# Patient Record
Sex: Male | Born: 1935 | Race: White | Hispanic: No | Marital: Married | Smoking: Former smoker
Health system: Southern US, Community
[De-identification: ages and names within clinical notes are randomized; demographics above are authoritative.]

## PROBLEM LIST (undated history)

## (undated) DIAGNOSIS — Z9289 Personal history of other medical treatment: Secondary | ICD-10-CM

## (undated) DIAGNOSIS — R51 Headache: Secondary | ICD-10-CM

## (undated) DIAGNOSIS — I714 Abdominal aortic aneurysm, without rupture, unspecified: Secondary | ICD-10-CM

## (undated) DIAGNOSIS — F329 Major depressive disorder, single episode, unspecified: Secondary | ICD-10-CM

## (undated) DIAGNOSIS — M1990 Unspecified osteoarthritis, unspecified site: Secondary | ICD-10-CM

## (undated) DIAGNOSIS — I2510 Atherosclerotic heart disease of native coronary artery without angina pectoris: Secondary | ICD-10-CM

## (undated) DIAGNOSIS — E785 Hyperlipidemia, unspecified: Secondary | ICD-10-CM

## (undated) DIAGNOSIS — I739 Peripheral vascular disease, unspecified: Secondary | ICD-10-CM

## (undated) DIAGNOSIS — J849 Interstitial pulmonary disease, unspecified: Secondary | ICD-10-CM

## (undated) DIAGNOSIS — F32A Depression, unspecified: Secondary | ICD-10-CM

## (undated) DIAGNOSIS — K754 Autoimmune hepatitis: Secondary | ICD-10-CM

## (undated) DIAGNOSIS — K649 Unspecified hemorrhoids: Secondary | ICD-10-CM

## (undated) DIAGNOSIS — I6529 Occlusion and stenosis of unspecified carotid artery: Secondary | ICD-10-CM

## (undated) DIAGNOSIS — I10 Essential (primary) hypertension: Secondary | ICD-10-CM

## (undated) DIAGNOSIS — G4486 Cervicogenic headache: Secondary | ICD-10-CM

## (undated) DIAGNOSIS — C801 Malignant (primary) neoplasm, unspecified: Secondary | ICD-10-CM

## (undated) DIAGNOSIS — R413 Other amnesia: Secondary | ICD-10-CM

## (undated) HISTORY — DX: Hyperlipidemia, unspecified: E78.5

## (undated) HISTORY — PX: POLYPECTOMY: SHX149

## (undated) HISTORY — PX: TONSILLECTOMY: SUR1361

## (undated) HISTORY — DX: Autoimmune hepatitis: K75.4

## (undated) HISTORY — DX: Peripheral vascular disease, unspecified: I73.9

## (undated) HISTORY — DX: Abdominal aortic aneurysm, without rupture: I71.4

## (undated) HISTORY — DX: Headache: R51

## (undated) HISTORY — DX: Depression, unspecified: F32.A

## (undated) HISTORY — DX: Unspecified hemorrhoids: K64.9

## (undated) HISTORY — PX: APPENDECTOMY: SHX54

## (undated) HISTORY — DX: Cervicogenic headache: G44.86

## (undated) HISTORY — DX: Essential (primary) hypertension: I10

## (undated) HISTORY — DX: Atherosclerotic heart disease of native coronary artery without angina pectoris: I25.10

## (undated) HISTORY — PX: SKIN CANCER EXCISION: SHX779

## (undated) HISTORY — PX: LUMBAR DISC SURGERY: SHX700

## (undated) HISTORY — DX: Interstitial pulmonary disease, unspecified: J84.9

## (undated) HISTORY — DX: Malignant (primary) neoplasm, unspecified: C80.1

## (undated) HISTORY — DX: Abdominal aortic aneurysm, without rupture, unspecified: I71.40

## (undated) HISTORY — DX: Personal history of other medical treatment: Z92.89

## (undated) HISTORY — DX: Other amnesia: R41.3

## (undated) HISTORY — PX: SPINE SURGERY: SHX786

## (undated) HISTORY — DX: Major depressive disorder, single episode, unspecified: F32.9

## (undated) HISTORY — DX: Unspecified osteoarthritis, unspecified site: M19.90

## (undated) HISTORY — DX: Occlusion and stenosis of unspecified carotid artery: I65.29

---

## 1999-10-18 HISTORY — PX: CORONARY ARTERY BYPASS GRAFT: SHX141

## 2005-08-29 ENCOUNTER — Ambulatory Visit: Payer: Self-pay | Admitting: Cardiology

## 2005-09-05 ENCOUNTER — Ambulatory Visit: Payer: Self-pay | Admitting: Cardiology

## 2005-09-21 ENCOUNTER — Ambulatory Visit: Payer: Self-pay | Admitting: Cardiology

## 2005-09-21 ENCOUNTER — Ambulatory Visit: Payer: Self-pay

## 2005-09-29 ENCOUNTER — Ambulatory Visit: Payer: Self-pay

## 2006-01-23 ENCOUNTER — Ambulatory Visit: Payer: Self-pay | Admitting: Internal Medicine

## 2006-01-31 ENCOUNTER — Ambulatory Visit: Payer: Self-pay | Admitting: Internal Medicine

## 2006-03-31 ENCOUNTER — Ambulatory Visit: Payer: Self-pay

## 2006-09-25 ENCOUNTER — Ambulatory Visit: Payer: Self-pay

## 2006-11-08 ENCOUNTER — Ambulatory Visit: Payer: Self-pay | Admitting: Cardiology

## 2006-11-08 ENCOUNTER — Ambulatory Visit: Payer: Self-pay

## 2006-11-16 ENCOUNTER — Ambulatory Visit: Payer: Self-pay

## 2007-03-14 ENCOUNTER — Ambulatory Visit: Payer: Self-pay

## 2007-08-06 ENCOUNTER — Encounter: Admission: RE | Admit: 2007-08-06 | Discharge: 2007-08-06 | Payer: Self-pay | Admitting: Internal Medicine

## 2007-08-07 ENCOUNTER — Encounter: Admission: RE | Admit: 2007-08-07 | Discharge: 2007-08-07 | Payer: Self-pay | Admitting: Internal Medicine

## 2007-08-13 ENCOUNTER — Encounter: Admission: RE | Admit: 2007-08-13 | Discharge: 2007-08-13 | Payer: Self-pay | Admitting: Internal Medicine

## 2007-08-24 ENCOUNTER — Ambulatory Visit: Payer: Self-pay

## 2007-08-27 ENCOUNTER — Encounter: Admission: RE | Admit: 2007-08-27 | Discharge: 2007-08-27 | Payer: Self-pay | Admitting: Internal Medicine

## 2007-10-25 ENCOUNTER — Ambulatory Visit: Payer: Self-pay | Admitting: Cardiology

## 2007-10-29 ENCOUNTER — Ambulatory Visit: Payer: Self-pay

## 2007-12-27 ENCOUNTER — Encounter: Admission: RE | Admit: 2007-12-27 | Discharge: 2007-12-27 | Payer: Self-pay | Admitting: Internal Medicine

## 2008-02-21 ENCOUNTER — Ambulatory Visit: Payer: Self-pay

## 2008-04-07 ENCOUNTER — Inpatient Hospital Stay (HOSPITAL_COMMUNITY): Admission: EM | Admit: 2008-04-07 | Discharge: 2008-04-08 | Payer: Self-pay | Admitting: Emergency Medicine

## 2008-04-07 ENCOUNTER — Ambulatory Visit: Payer: Self-pay | Admitting: Internal Medicine

## 2008-04-10 ENCOUNTER — Encounter: Admission: RE | Admit: 2008-04-10 | Discharge: 2008-04-10 | Payer: Self-pay | Admitting: Neurological Surgery

## 2008-04-17 ENCOUNTER — Encounter: Admission: RE | Admit: 2008-04-17 | Discharge: 2008-04-17 | Payer: Self-pay | Admitting: Neurological Surgery

## 2008-05-17 HISTORY — PX: OTHER SURGICAL HISTORY: SHX169

## 2008-05-21 ENCOUNTER — Ambulatory Visit (HOSPITAL_COMMUNITY): Admission: RE | Admit: 2008-05-21 | Discharge: 2008-05-22 | Payer: Self-pay | Admitting: Orthopedic Surgery

## 2008-06-24 ENCOUNTER — Ambulatory Visit: Payer: Self-pay | Admitting: Cardiology

## 2008-08-26 ENCOUNTER — Ambulatory Visit: Payer: Self-pay

## 2009-02-09 ENCOUNTER — Telehealth (INDEPENDENT_AMBULATORY_CARE_PROVIDER_SITE_OTHER): Payer: Self-pay | Admitting: *Deleted

## 2009-02-10 DIAGNOSIS — K7689 Other specified diseases of liver: Secondary | ICD-10-CM

## 2009-02-10 DIAGNOSIS — I714 Abdominal aortic aneurysm, without rupture, unspecified: Secondary | ICD-10-CM | POA: Insufficient documentation

## 2009-02-11 ENCOUNTER — Ambulatory Visit: Payer: Self-pay | Admitting: Internal Medicine

## 2009-02-11 DIAGNOSIS — R740 Nonspecific elevation of levels of transaminase and lactic acid dehydrogenase [LDH]: Secondary | ICD-10-CM

## 2009-02-11 DIAGNOSIS — IMO0002 Reserved for concepts with insufficient information to code with codable children: Secondary | ICD-10-CM | POA: Insufficient documentation

## 2009-02-11 DIAGNOSIS — I2510 Atherosclerotic heart disease of native coronary artery without angina pectoris: Secondary | ICD-10-CM

## 2009-02-11 DIAGNOSIS — K649 Unspecified hemorrhoids: Secondary | ICD-10-CM | POA: Insufficient documentation

## 2009-02-11 LAB — CONVERTED CEMR LAB
Alkaline Phosphatase: 148 units/L — ABNORMAL HIGH (ref 39–117)
Bilirubin, Direct: 0.2 mg/dL (ref 0.0–0.3)
INR: 1 (ref 0.8–1.0)
Iron: 133 ug/dL (ref 42–165)
Prothrombin Time: 11.3 s (ref 10.9–13.3)
Saturation Ratios: 29.1 % (ref 20.0–50.0)
Total Bilirubin: 1 mg/dL (ref 0.3–1.2)
Total Protein: 7.9 g/dL (ref 6.0–8.3)
Transferrin: 326.1 mg/dL (ref 212.0–360.0)

## 2009-02-19 ENCOUNTER — Ambulatory Visit: Payer: Self-pay | Admitting: Internal Medicine

## 2009-02-19 LAB — CONVERTED CEMR LAB: Anti Nuclear Antibody(ANA): NEGATIVE

## 2009-02-23 ENCOUNTER — Encounter: Payer: Self-pay | Admitting: Internal Medicine

## 2009-02-23 DIAGNOSIS — K754 Autoimmune hepatitis: Secondary | ICD-10-CM | POA: Insufficient documentation

## 2009-02-23 LAB — CONVERTED CEMR LAB
ALT: 532 units/L — ABNORMAL HIGH (ref 0–53)
AST: 544 units/L — ABNORMAL HIGH (ref 0–37)
Albumin: 3.7 g/dL (ref 3.5–5.2)
Alkaline Phosphatase: 126 units/L — ABNORMAL HIGH (ref 39–117)
Total Protein: 7.3 g/dL (ref 6.0–8.3)

## 2009-02-24 ENCOUNTER — Telehealth (INDEPENDENT_AMBULATORY_CARE_PROVIDER_SITE_OTHER): Payer: Self-pay | Admitting: *Deleted

## 2009-02-26 ENCOUNTER — Ambulatory Visit: Payer: Self-pay | Admitting: Internal Medicine

## 2009-03-02 ENCOUNTER — Encounter (INDEPENDENT_AMBULATORY_CARE_PROVIDER_SITE_OTHER): Payer: Self-pay | Admitting: Diagnostic Radiology

## 2009-03-02 ENCOUNTER — Ambulatory Visit (HOSPITAL_COMMUNITY): Admission: RE | Admit: 2009-03-02 | Discharge: 2009-03-02 | Payer: Self-pay | Admitting: Internal Medicine

## 2009-03-02 ENCOUNTER — Encounter: Payer: Self-pay | Admitting: Internal Medicine

## 2009-03-04 ENCOUNTER — Ambulatory Visit: Payer: Self-pay | Admitting: Internal Medicine

## 2009-03-04 LAB — CONVERTED CEMR LAB
ALT: 509 units/L — ABNORMAL HIGH (ref 0–53)
AST: 487 units/L — ABNORMAL HIGH (ref 0–37)
Alkaline Phosphatase: 144 units/L — ABNORMAL HIGH (ref 39–117)
Bilirubin, Direct: 0.3 mg/dL (ref 0.0–0.3)
Total Bilirubin: 0.7 mg/dL (ref 0.3–1.2)

## 2009-03-12 ENCOUNTER — Encounter: Payer: Self-pay | Admitting: Cardiology

## 2009-03-12 ENCOUNTER — Ambulatory Visit: Payer: Self-pay

## 2009-03-12 ENCOUNTER — Telehealth: Payer: Self-pay | Admitting: Internal Medicine

## 2009-03-19 ENCOUNTER — Telehealth: Payer: Self-pay | Admitting: Cardiology

## 2009-03-20 ENCOUNTER — Ambulatory Visit: Payer: Self-pay | Admitting: Internal Medicine

## 2009-03-20 ENCOUNTER — Telehealth: Payer: Self-pay | Admitting: Cardiology

## 2009-03-20 LAB — CONVERTED CEMR LAB
ALT: 60 units/L — ABNORMAL HIGH (ref 0–53)
Albumin: 3.3 g/dL — ABNORMAL LOW (ref 3.5–5.2)
Bilirubin, Direct: 0.2 mg/dL (ref 0.0–0.3)
INR: 1 (ref 0.8–1.0)
Prothrombin Time: 10.6 s — ABNORMAL LOW (ref 10.9–13.3)
Total Protein: 6.7 g/dL (ref 6.0–8.3)

## 2009-03-30 ENCOUNTER — Ambulatory Visit: Payer: Self-pay | Admitting: Internal Medicine

## 2009-03-30 LAB — CONVERTED CEMR LAB
AST: 44 units/L — ABNORMAL HIGH (ref 0–37)
Albumin: 3.1 g/dL — ABNORMAL LOW (ref 3.5–5.2)
Prothrombin Time: 10.4 s — ABNORMAL LOW (ref 10.9–13.3)

## 2009-03-31 ENCOUNTER — Ambulatory Visit: Payer: Self-pay | Admitting: Internal Medicine

## 2009-04-14 ENCOUNTER — Ambulatory Visit: Payer: Self-pay | Admitting: Internal Medicine

## 2009-04-14 LAB — CONVERTED CEMR LAB
Bilirubin, Direct: 0.1 mg/dL (ref 0.0–0.3)
Total Protein: 6.1 g/dL (ref 6.0–8.3)

## 2009-05-11 ENCOUNTER — Encounter (INDEPENDENT_AMBULATORY_CARE_PROVIDER_SITE_OTHER): Payer: Self-pay | Admitting: *Deleted

## 2009-05-13 ENCOUNTER — Ambulatory Visit: Payer: Self-pay | Admitting: Internal Medicine

## 2009-05-13 LAB — CONVERTED CEMR LAB
Alkaline Phosphatase: 70 units/L (ref 39–117)
Bilirubin, Direct: 0.1 mg/dL (ref 0.0–0.3)
Total Protein: 6.2 g/dL (ref 6.0–8.3)

## 2009-05-21 ENCOUNTER — Telehealth: Payer: Self-pay | Admitting: Cardiology

## 2009-06-12 ENCOUNTER — Ambulatory Visit: Payer: Self-pay

## 2009-06-12 ENCOUNTER — Encounter: Payer: Self-pay | Admitting: Cardiology

## 2009-06-26 ENCOUNTER — Ambulatory Visit: Payer: Self-pay | Admitting: Internal Medicine

## 2009-06-26 LAB — CONVERTED CEMR LAB
AST: 39 units/L — ABNORMAL HIGH (ref 0–37)
Albumin: 3.5 g/dL (ref 3.5–5.2)
Alkaline Phosphatase: 54 units/L (ref 39–117)
Total Protein: 6.6 g/dL (ref 6.0–8.3)

## 2009-07-15 DIAGNOSIS — M1990 Unspecified osteoarthritis, unspecified site: Secondary | ICD-10-CM | POA: Insufficient documentation

## 2009-07-15 DIAGNOSIS — I6529 Occlusion and stenosis of unspecified carotid artery: Secondary | ICD-10-CM

## 2009-07-15 DIAGNOSIS — E785 Hyperlipidemia, unspecified: Secondary | ICD-10-CM

## 2009-07-15 DIAGNOSIS — I959 Hypotension, unspecified: Secondary | ICD-10-CM

## 2009-07-15 DIAGNOSIS — I739 Peripheral vascular disease, unspecified: Secondary | ICD-10-CM | POA: Insufficient documentation

## 2009-07-15 DIAGNOSIS — R079 Chest pain, unspecified: Secondary | ICD-10-CM | POA: Insufficient documentation

## 2009-07-16 ENCOUNTER — Ambulatory Visit: Payer: Self-pay | Admitting: Cardiology

## 2009-07-20 ENCOUNTER — Ambulatory Visit: Payer: Self-pay | Admitting: Internal Medicine

## 2009-07-21 LAB — CONVERTED CEMR LAB: Albumin: 3.5 g/dL (ref 3.5–5.2)

## 2009-07-24 ENCOUNTER — Ambulatory Visit: Payer: Self-pay | Admitting: Internal Medicine

## 2009-09-16 ENCOUNTER — Telehealth: Payer: Self-pay | Admitting: Internal Medicine

## 2009-09-17 ENCOUNTER — Ambulatory Visit: Payer: Self-pay | Admitting: Internal Medicine

## 2009-09-17 LAB — CONVERTED CEMR LAB
Bilirubin, Direct: 0.1 mg/dL (ref 0.0–0.3)
Total Bilirubin: 0.8 mg/dL (ref 0.3–1.2)

## 2009-12-11 ENCOUNTER — Encounter (INDEPENDENT_AMBULATORY_CARE_PROVIDER_SITE_OTHER): Payer: Self-pay | Admitting: *Deleted

## 2009-12-14 ENCOUNTER — Ambulatory Visit: Payer: Self-pay

## 2009-12-14 ENCOUNTER — Encounter: Payer: Self-pay | Admitting: Cardiology

## 2009-12-15 ENCOUNTER — Ambulatory Visit: Payer: Self-pay | Admitting: Internal Medicine

## 2009-12-15 ENCOUNTER — Telehealth: Payer: Self-pay | Admitting: Internal Medicine

## 2009-12-15 LAB — CONVERTED CEMR LAB
ALT: 28 units/L (ref 0–53)
AST: 34 units/L (ref 0–37)
Alkaline Phosphatase: 80 units/L (ref 39–117)
Bilirubin, Direct: 0.1 mg/dL (ref 0.0–0.3)
Total Bilirubin: 0.5 mg/dL (ref 0.3–1.2)

## 2010-02-10 ENCOUNTER — Ambulatory Visit: Payer: Self-pay | Admitting: Internal Medicine

## 2010-03-08 ENCOUNTER — Ambulatory Visit: Payer: Self-pay | Admitting: Internal Medicine

## 2010-03-08 LAB — CONVERTED CEMR LAB
ALT: 33 units/L (ref 0–53)
Albumin: 3.7 g/dL (ref 3.5–5.2)
Bilirubin, Direct: 0.1 mg/dL (ref 0.0–0.3)
Total Protein: 6.3 g/dL (ref 6.0–8.3)

## 2010-03-09 ENCOUNTER — Ambulatory Visit: Payer: Self-pay | Admitting: Internal Medicine

## 2010-03-12 ENCOUNTER — Encounter: Payer: Self-pay | Admitting: Cardiology

## 2010-03-16 ENCOUNTER — Ambulatory Visit: Payer: Self-pay

## 2010-03-17 ENCOUNTER — Ambulatory Visit (HOSPITAL_COMMUNITY)
Admission: RE | Admit: 2010-03-17 | Discharge: 2010-03-17 | Payer: Self-pay | Source: Home / Self Care | Admitting: Internal Medicine

## 2010-03-17 ENCOUNTER — Encounter (INDEPENDENT_AMBULATORY_CARE_PROVIDER_SITE_OTHER): Payer: Self-pay | Admitting: *Deleted

## 2010-03-29 ENCOUNTER — Telehealth: Payer: Self-pay | Admitting: Internal Medicine

## 2010-04-12 ENCOUNTER — Telehealth: Payer: Self-pay | Admitting: Internal Medicine

## 2010-06-25 ENCOUNTER — Encounter: Payer: Self-pay | Admitting: Internal Medicine

## 2010-06-30 ENCOUNTER — Telehealth: Payer: Self-pay | Admitting: Internal Medicine

## 2010-07-13 ENCOUNTER — Encounter: Payer: Self-pay | Admitting: Cardiology

## 2010-07-14 ENCOUNTER — Ambulatory Visit: Payer: Self-pay

## 2010-07-14 ENCOUNTER — Encounter: Payer: Self-pay | Admitting: Cardiology

## 2010-07-21 ENCOUNTER — Encounter: Payer: Self-pay | Admitting: Cardiology

## 2010-07-26 ENCOUNTER — Ambulatory Visit: Payer: Self-pay | Admitting: Cardiology

## 2010-07-26 ENCOUNTER — Telehealth: Payer: Self-pay | Admitting: Cardiology

## 2010-08-02 ENCOUNTER — Telehealth: Payer: Self-pay | Admitting: Cardiology

## 2010-08-04 ENCOUNTER — Ambulatory Visit: Payer: Self-pay | Admitting: Vascular Surgery

## 2010-08-04 ENCOUNTER — Encounter: Admission: RE | Admit: 2010-08-04 | Discharge: 2010-08-04 | Payer: Self-pay | Admitting: Vascular Surgery

## 2010-08-04 ENCOUNTER — Encounter: Payer: Self-pay | Admitting: Cardiology

## 2010-08-09 ENCOUNTER — Telehealth: Payer: Self-pay | Admitting: Cardiology

## 2010-08-09 ENCOUNTER — Ambulatory Visit (HOSPITAL_COMMUNITY): Admission: RE | Admit: 2010-08-09 | Discharge: 2010-08-09 | Payer: Self-pay | Admitting: Vascular Surgery

## 2010-08-12 ENCOUNTER — Telehealth (INDEPENDENT_AMBULATORY_CARE_PROVIDER_SITE_OTHER): Payer: Self-pay | Admitting: *Deleted

## 2010-08-16 ENCOUNTER — Encounter: Payer: Self-pay | Admitting: Internal Medicine

## 2010-08-16 ENCOUNTER — Ambulatory Visit: Payer: Self-pay

## 2010-08-16 ENCOUNTER — Ambulatory Visit: Payer: Self-pay | Admitting: Internal Medicine

## 2010-08-16 ENCOUNTER — Encounter (HOSPITAL_COMMUNITY)
Admission: RE | Admit: 2010-08-16 | Discharge: 2010-10-16 | Payer: Self-pay | Source: Home / Self Care | Attending: Cardiology | Admitting: Cardiology

## 2010-08-18 ENCOUNTER — Encounter: Payer: Self-pay | Admitting: Cardiology

## 2010-08-18 ENCOUNTER — Ambulatory Visit: Payer: Self-pay | Admitting: Vascular Surgery

## 2010-08-18 ENCOUNTER — Telehealth: Payer: Self-pay | Admitting: Cardiology

## 2010-08-30 ENCOUNTER — Ambulatory Visit: Payer: Self-pay | Admitting: Internal Medicine

## 2010-08-30 LAB — CONVERTED CEMR LAB
Bilirubin, Direct: 0.1 mg/dL (ref 0.0–0.3)
Total Bilirubin: 0.6 mg/dL (ref 0.3–1.2)
Total Protein: 6.7 g/dL (ref 6.0–8.3)

## 2010-08-31 ENCOUNTER — Inpatient Hospital Stay (HOSPITAL_COMMUNITY): Admission: RE | Admit: 2010-08-31 | Discharge: 2010-09-01 | Payer: Self-pay | Admitting: Vascular Surgery

## 2010-08-31 ENCOUNTER — Ambulatory Visit: Payer: Self-pay | Admitting: Vascular Surgery

## 2010-08-31 HISTORY — PX: ABDOMINAL AORTIC ANEURYSM REPAIR: SUR1152

## 2010-09-15 ENCOUNTER — Encounter: Payer: Self-pay | Admitting: Cardiology

## 2010-09-15 ENCOUNTER — Encounter: Admission: RE | Admit: 2010-09-15 | Discharge: 2010-09-15 | Payer: Self-pay | Admitting: Vascular Surgery

## 2010-09-15 ENCOUNTER — Ambulatory Visit: Payer: Self-pay | Admitting: Vascular Surgery

## 2010-11-16 NOTE — Assessment & Plan Note (Signed)
Summary: F/U APPT.Marland Kitchenautoimmune hepatitis   History of Present Illness Visit Type: Follow-up Visit Primary GI MD: Yancey Flemings MD Primary Provider: Creola Corn, MD  Requesting Provider: n/a Chief Complaint: F/u for Autoimmune Hepatitis. Pt states that he is doing good and denies all other GI complaints  History of Present Illness:   75 year old white male with hypertension, coronary artery disease, and dyslipidemia. 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 He has been treated with prednisone with an excellent biochemical response. He was last evaluated in October 2010 at which time he was on 10 mg of prednisone daily. He has continued on that dosage and presents for routine office followup. Laboratories obtained December 15, 2009 revealed normal liver tests. Outside blood work from February 02, 2010 (reviewed) also shows normal liver tests in addition to albumin and protein levels. CBC also normal. The patient has no complaints. Inquire as about further tapering of prednisone. He tells me that recent bone density evaluation was normal.   GI Review of Systems      Denies abdominal pain, acid reflux, belching, bloating, chest pain, dysphagia with liquids, dysphagia with solids, heartburn, loss of appetite, nausea, vomiting, vomiting blood, weight loss, and  weight gain.      Reports liver problems.     Denies anal fissure, black tarry stools, change in bowel habit, constipation, diarrhea, diverticulosis, fecal incontinence, heme positive stool, hemorrhoids, irritable bowel syndrome, jaundice, light color stool, rectal bleeding, and  rectal pain.    Current Medications (verified): 1)  Atenolol 25 Mg Tabs (Atenolol) .Marland Kitchen.. 1 Tablet By Mouth Once Daily 2)  Prednisone 10 Mg Tabs (Prednisone) .... Take One Tablet Daily With Food 3)  Lipitor 10 Mg Tabs (Atorvastatin Calcium) .... 1/2 By Mouth Once Daily 4)  Aspirin 81 Mg Tbec (Aspirin) .Marland Kitchen.. 1  By Mouth Once Daily 5)  Sertraline Hcl 50 Mg Tabs (Sertraline Hcl) .... Once Daily 6)  Nitrostat 0.4 Mg Subl (Nitroglycerin) .Marland Kitchen.. 1 By Mouth As Needed  Allergies (verified): No Known Drug Allergies  Past History:  Past Medical History: HYPERTENSION NEC (ICD-997.91) CAD (ICD-414.00) CHEST PAIN (ICD-786.50) PVD (ICD-443.9) HYPOTENSION (ICD-458.9) CAROTID STENOSIS (ICD-433.10) (less than 60% bilateral) AUTOIMMUNE HEPATITIS (ICD-571.42) TRANSAMINASES, SERUM, ELEVATED (ICD-790.4) HEMORRHOIDS (ICD-455.6) FATTY LIVER DISEASE (ICD-571.8) AAA (ICD-441.4)  (4.4 x 4.4) DEGENERATIVE JOINT DISEASE (ICD-715.90) DYSLIPIDEMIA (ICD-272.4) AUTOIMMUNE HEPATITIS (ICD-571.42)  Past Surgical History: Reviewed history from 07/16/2009 and no changes required. CABG x 4 (He had LIMA to the LAD, SVG to diagonal, SVG to obtuse margin,  and SVG to PDA 2001) Appendectomy Tonsillectomy Skin cancer removal L2-3  hemilaminectomy and medial facetectomy.  Family History: No FH of Colon Cancer: Family History of Colon Polyps:Father  Social History: Reviewed history from 03/04/2009 and no changes required. Patient is a former smoker. quit 1970s Illicit Drug Use - no Occupation: Retired Daily Caffeine Use: 6 cups of coffee Alcohol Use - no  Review of Systems  The patient denies allergy/sinus, anemia, anxiety-new, arthritis/joint pain, back pain, blood in urine, breast changes/lumps, change in vision, confusion, cough, coughing up blood, depression-new, fainting, fatigue, fever, headaches-new, hearing problems, heart murmur, heart rhythm changes, itching, muscle pains/cramps, night sweats, nosebleeds, shortness of breath, skin rash, sleeping problems, sore throat, swelling of feet/legs, swollen lymph glands, thirst - excessive, urination - excessive, urination changes/pain, urine leakage, vision changes, and voice change.    Vital Signs:  Patient profile:   75 year old male Height:  72  inches Weight:      191 pounds BMI:     26.00 BSA:     2.09 Pulse rate:   68 / minute Pulse rhythm:   regular BP sitting:   122 / 64  (left arm) Cuff size:   regular  Vitals Entered By: Ok Anis CMA (February 10, 2010 8:29 AM)  Physical Exam  General:  Well developed, well nourished, no acute distress. Mildly cushingoid. Head:  Normocephalic and atraumatic. Eyes:  PERRLA, no icterus. Nose:  No deformity, discharge,  or lesions. Mouth:  No deformity or lesions, dentition normal. Neck:  Supple; no masses or thyromegaly. Lungs:  Clear throughout to auscultation. Heart:  Regular rate and rhythm; no murmurs, rubs,  or bruits. Abdomen:  Soft, nontender and nondistended. No masses, hepatosplenomegaly or hernias noted. Normal bowel sounds. Msk:  Symmetrical with no gross deformities. Normal posture. Pulses:  Normal pulses noted. Extremities:  No clubbing, cyanosis, edema or deformities noted. Neurologic:  Alert and  oriented x4;  grossly normal neurologically. Skin:  Intact without significant lesions or rashes. Psych:  Alert and cooperative. Normal mood and affect.   Impression & Recommendations:  Problem # 1:  AUTOIMMUNE HEPATITIS (ICD-571.42) Autoimmune hepatitis with PBC overlap syndrome. Clinically asymptomatic. Liver function tests entirely normal for at least 5 months.  Plan: #1. Decrease prednisone to 5 mg daily #2. Repeat liver function tests in one month #3. His liver tests remain normal then proceed with liver biopsy. This will be approximately 1 year since the initiation of therapy. Results of the biopsy will determine the feasibility of discontinuing prednisone therapy with close biochemical followup thereafter.  Patient Instructions: 1)  Decrease Prednisone- instructions given by Dr. Marina Goodell. 2)  LFTs. to be drawn end of May. 3)  The medication list was reviewed and reconciled.  All changed / newly prescribed medications were explained.  A complete medication list was  provided to the patient / caregiver. 4)  Copy: Dr. Creola Corn

## 2010-11-16 NOTE — Progress Notes (Signed)
Summary: Biopsy results  Phone Note Call from Patient Call back at Home Phone [phone]   Caller: Patient Call For: Dr. Marina Goodell Reason for Call: Lab or Test Results Summary of Call: Calling about liver biopsy results Initial call taken by: Karna Christmas,  March 29, 2010 11:56 AM  Follow-up for Phone Call        Let patient know that it looks much better. Haven't called him back yet as final path pending (sent to liver expert for additional opinion). No change in plans for now. I'll call him back to dicuss further, probably next week.  PLEASE MAKE SURE THAT THE REPORT GETS INTO EMR AS A PATH REPORT ON ITS OWN (not attached to a phone note)   Follow-up by: Hilarie Fredrickson MD,  March 29, 2010 2:12 PM    SP-Surgical Pathology - STATUS: Final  .                                         Perform Date: 1 Jun11 00:01  Ordered By: Hoy Finlay MD , PROVIDER         Ordered Date:  Facility: [facility]                              Department: [facility]  Service Report Text  [facility]   [address]   [place], [place] [address]   Telephone [phone] or [phone] Fax [phone]    REPORT OF SURGICAL PATHOLOGY    Case #: [id number]   Patient Name: Cesar [name], Cesar [name]   Office Chart Number: [id number]    MRN: [id number]   Pathologist: Dierdre Searles M.D., Gwendolyn Grant   DOB/Age April 16, 1936 (Age: 47) Gender: M   Date Taken: 03/17/2010   Date Received: 03/17/2010    FINAL DIAGNOSIS   ***Microscopic Examination and Diagnosis***    1. LIVER BX, : PATCHY MILD MIXED PORTAL INFLAMMATION. PLEASE SEE COMMENT.   Minimal portal fibrosis.   No iron deposition.    DATE REPORTED: 03/19/2010   *** Electronically Signed Out by Dierdre Searles M.D., Gwendolyn Grant, Pathologist, Electronic Signature ***    CLINICAL HISTORY    SPECIMEN(S) OBTAINED   1. Liver bx,    SPECIMEN COMMENTS:   1. Autoimmune hepatitis; previous biopsy 02/2009. (Lw)    Gross Description   1. Received in formalin are three  cores of tan brown soft tissue which range   from 1.3 x 0.1 cm to 1.6 x 0.1 cm and are submitted in toto in one block.   (Sw:Gt, 03/17/10)    MICROSCOPIC DESCRIPTION   1. Multiple needle core biopsies are available for evaluation. There is patchy   mild mixed portal inflammation predominantly composed of lymphocytes and plasma   cells. Compared to the prior biopsy material [id number], the interface   inflammation has markedly reduced with very minimal interface activity and   lobular activity . One portal tract demonstrates minimal lymphocytic   infiltrates in the duct. There are single and clusters of pigment laden   macrophages(ceroid macrophages) scattered in the lobules. No steatosis or viral   changes are noted. Reticulin stain and trichrome stain show focal minimal portal   fibrosis. Iron stain shows no evidence of iron deposition. Controls are   appropriate. The overall findings demonstrate significant response to treatment   and marked improvement in  morphology. Given the clinical history and histology   findings, i cannot completely exclude the possibility of statin triggered   autoimmune liver disease. Dr. Frederica Kuster has also reviewed this case and essentially   agrees with the above diagnosis. Case was discussed with dr. Marina Goodell on 03/19/2010.    CASE COMMENTS   STAINS USED IN DIAGNOSIS:   Iron stain   Masson's trichrome stain   Reticulin stain   Additional Information  HL7 RESULT STATUS : F  External IF Update Timestamp : 2010-03-19:19:17:00.000000 SP-Surgical Pathology - STATUS: Final  .                                         Perform Date: 1 Jun11 00:01  Ordered By: DEFAULT MD , PROVIDER         Ordered Date:  Facility: [facility]                              Department: CPATH  Service Report Text  [facility]   [address]   [place], [place] [address]   Telephone [phone] or [phone] Fax [phone]    REPORT OF SURGICAL PATHOLOGY     Case #: [id number]   Patient Name: Cesar [name], Cesar [name]   Office Chart Number: [id number]    MRN: [id number]   Pathologist: Dierdre Searles M.D., Gwendolyn Grant   DOB/Age [birth year] (Age: 70) Gender: M   Date Taken: 03/17/2010   Date Received: 03/17/2010    FINAL DIAGNOSIS   ***Microscopic Examination and Diagnosis***    1. LIVER BX, : PATCHY MILD MIXED PORTAL INFLAMMATION. PLEASE SEE COMMENT.   Minimal portal fibrosis.   No iron deposition.    DATE REPORTED: 03/19/2010   *** Electronically Signed Out by Dierdre Searles M.D., Gwendolyn Grant, Pathologist, Electronic Signature ***    CLINICAL HISTORY    SPECIMEN(S) OBTAINED   1. Liver bx,    SPECIMEN COMMENTS:   1. Autoimmune hepatitis; previous biopsy 02/2009. (Lw)    Gross Description   1. Received in formalin are three cores of tan brown soft tissue which range   from 1.3 x 0.1 cm to 1.6 x 0.1 cm and are submitted in toto in one block.   (Sw:Gt, 03/17/10)    MICROSCOPIC DESCRIPTION   1. Multiple needle core biopsies are available for evaluation. There is patchy   mild mixed portal inflammation predominantly composed of lymphocytes and plasma   cells. Compared to the prior biopsy material [id number], the interface   inflammation has markedly reduced with very minimal interface activity and   lobular activity . One portal tract demonstrates minimal lymphocytic   infiltrates in the duct. There are single and clusters of pigment laden   macrophages(ceroid macrophages) scattered in the lobules. No steatosis or viral   changes are noted. Reticulin stain and trichrome stain show focal minimal portal   fibrosis. Iron stain shows no evidence of iron deposition. Controls are   appropriate. The overall findings demonstrate significant response to treatment   and marked improvement in morphology. Given the clinical history and histology   findings, i cannot completely exclude the possibility of statin triggered   autoimmune liver disease. Dr. Frederica Kuster has also reviewed this case  and essentially   agrees with the above diagnosis. Case was discussed with dr. Marina Goodell on 03/19/2010.    CASE  COMMENTS   STAINS USED IN DIAGNOSIS:   Iron stain   Masson's trichrome stain   Reticulin stain   Additional Information  HL7 RESULT STATUS : F  External IF Update Timestamp : 2010-03-19:19:17:00.000000  Appended Document: Biopsy results pt. ntfd. of Dr.Perry's response to biopsy.

## 2010-11-16 NOTE — Progress Notes (Signed)
Summary: Lab results (LFT'S)  Phone Note Call from Patient Call back at Home Phone [phone]   Caller: Spouse Glenda Call For: Dr. Marina Goodell Reason for Call: Talk to Nurse Summary of Call: Calling about Lab results from Dr. Ferd Hibbs office...results was faxed on 06-28-10 Initial call taken by: Karna Christmas,  June 30, 2010 2:31 PM  Follow-up for Phone Call        Glenda ntfd. that Dr.Perry did review pt's. labs done at Dr.Russo's office on 06/28/10 and he indicated they are normal.Has been on Prednisone 5mg . daily since second liver biopsy asking if  it should be disc? Follow-up by: Teryl Lucy RN,  June 30, 2010 3:11 PM  Additional Follow-up for Phone Call Additional follow up Details #1::        go ahead and decrease prednisone to 2.5mg  for 2 weeks then stop. Have LFT's repeated in 2 months Additional Follow-up by: Hilarie Fredrickson MD,  June 30, 2010 3:26 PM    Additional Follow-up for Phone Call Additional follow up Details #2::    Message left to call back. Teryl Lucy RN  July 01, 2010 9:22 AM Orders from DrPerry reviewed with Rivka Barbara and labs scheduled for 2 months. Follow-up by: Teryl Lucy RN,  July 01, 2010 2:05 PM

## 2010-11-16 NOTE — Progress Notes (Signed)
----   Converted from flag ---- ---- 07/26/2010 11:20 AM, Hilarie Fredrickson MD wrote: Leta Jungling, there is a concern that he may have had "statin triggered autoimmune hepatitis". Thus, there is some concern for recurrent liver problems with reintroduction of the drug.  If there are non-statin alternatives, this might be best. If not, we would need to monitor his LFT's very closely.  John  ---- 07/26/2010 9:55 AM, Rollene Rotunda, MD, [facility] wrote: Jonny Ruiz,  Can I start this patient back on Lipitor? ------------------------------

## 2010-11-16 NOTE — Progress Notes (Signed)
Summary: pt needs stress test set up right away/ order sent  Phone Note Call from Patient Call back at Home Phone [phone]   Caller: Patient Reason for Call: Talk to Nurse, Talk to Doctor Summary of Call: pt is home from procedure today and Dr. Edilia Bo wants his stress test set up right away  Initial call taken by: Omer Jack,  August 09, 2010 10:46 AM  Follow-up for Phone Call        I have to have a order for the testing and type of test from Dr Antoine Poche but will have it scheduled as soon as I can obtain the order.  Sander Nephew, RN  Pt needs to have a stress myoview for surgical clearance per Dr Rollene Rotunda Follow-up by: Charolotte Capuchin, RN,  August 09, 2010 11:02 AM  New Problems: PRE-OPERATIVE CARDIOVASCULAR EXAMINATION (ICD-V72.81)   New Problems: PRE-OPERATIVE CARDIOVASCULAR EXAMINATION (ICD-V72.81)

## 2010-11-16 NOTE — Miscellaneous (Signed)
Summary: Orders Update  Clinical Lists Changes  Orders: Added new Test order of Carotid Duplex (Carotid Duplex) - Signed 

## 2010-11-16 NOTE — Miscellaneous (Signed)
  Clinical Lists Changes  Observations: Added new observation of ABDOM US: Increase in dimensions of infrarenal fusiform AAA, now 5.1 cm X 5.3 cm Increase in the lenght of AAA, now 12.1cm in length Normal caliber common and external iliac arteries, bilaterally Mild bilateral common iliac artery stenosis  consider surgical consult Otherwise, f/u 6 months (07/14/2010 8:57) Added new observation of US CAROTID: Mild carotid artery disease bilaterally, stable over serial exams Elevated subclavian artery velocities, possibly source of the bruit 40-59% RICA stenosis 0-39% LICA stenosis  f/u 2 years (03/12/2010 8:58)      [place] of Abdomen  Procedure date:  07/14/2010  Findings:      Increase in dimensions of infrarenal fusiform AAA, now 5.1 cm X 5.3 cm Increase in the lenght of AAA, now 12.1cm in length Normal caliber common and external iliac arteries, bilaterally Mild bilateral common iliac artery stenosis  consider surgical consult Otherwise, f/u 6 months  Carotid Doppler  Procedure date:  03/12/2010  Findings:      Mild carotid artery disease bilaterally, stable over serial exams Elevated subclavian artery velocities, possibly source of the bruit 40-59% RICA stenosis 0-39% LICA stenosis  f/u 2 years

## 2010-11-16 NOTE — Progress Notes (Signed)
Summary: Nuclear Pre-Procedure  Phone Note Outgoing Call   Call placed by: Gerre Pebbles RN Summary of Call: Left message with information on My[employer] Information Sheet (see scanned document for details).      Nuclear Med Background Indications for Stress Test: Evaluation for Ischemia, Surgical Clearance, Graft Patency  Indications Comments: Pre-OP AAA (5.3) C.Edilia Bo  History: CABG, Heart Catheterization, Myocardial Perfusion Study  History Comments: 02/01 CABG x4 01/09 MPS NL EF 65%     Nuclear Pre-Procedure Cardiac Risk Factors: Carotid Disease, Family History - CAD, History of Smoking, Hypertension, Lipids, PVD Height (in): 72  Nuclear Med Study Referring MD:  J.Hochrein

## 2010-11-16 NOTE — Assessment & Plan Note (Signed)
Summary: 1 yr rov 414.01  pfh,rn   Visit Type:  Follow-up Referring Provider:  n/a Primary Provider:  Creola Corn, MD   CC:  CAD and PVD.  History of Present Illness: The patient presents for followup. Since I last saw him he has had no new cardiovascular complaints and he has been treated for appetite is described as autoimmune currently reports that this is inactive in his medications have been reduced discontinued. During the course of this he has been taken off his statin. He has had followup of his abdominal aortic aneurysm and carotid stenosis. Carotids earlier this year are stable demonstrating 40-59% left stenosis and less than 39% right stenosis. However, the abdominal aneurysm has grown to 5.1 x 5.3 and is now 12.1 cm in length. He denies any cardiovascular symptoms and remains active. He denies any chest pressure, neck or arm discomfort. He has had no palpitations, presyncope or syncope. He denies any PND or orthopnea. He does have some leg cramping and occasional numbness in his right foot.  Current Medications (verified): 1)  Atenolol 25 Mg Tabs (Atenolol) .Marland Kitchen.. 1 Tablet By Mouth Once Daily 2)  Prednisone 10 Mg Tabs (Prednisone) .... Take 1/2  Tablet Daily With Food 3)  Aspirin 81 Mg Tbec (Aspirin) .Marland Kitchen.. 1 By Mouth Once Daily 4)  Sertraline Hcl 50 Mg Tabs (Sertraline Hcl) .... Once Daily 5)  Nitrostat 0.4 Mg Subl (Nitroglycerin) .Marland Kitchen.. 1 By Mouth As Needed 6)  Fenofibrate 160 Mg Tabs (Fenofibrate) .Marland Kitchen.. 1 By Mouth Daily  Allergies (verified): No Known Drug Allergies  Past History:  Past Medical History: HYPERTENSION NEC (ICD-997.91) CAD (ICD-414.00) CHEST PAIN (ICD-786.50) PVD (ICD-443.9) HYPOTENSION (ICD-458.9) CAROTID STENOSIS (ICD-433.10) (less than 60% bilateral) AUTOIMMUNE HEPATITIS (ICD-571.42) TRANSAMINASES, SERUM, ELEVATED (ICD-790.4) HEMORRHOIDS (ICD-455.6) FATTY LIVER DISEASE (ICD-571.8) AAA (ICD-441.4)  (5.1 x 5.3  06/2010) DEGENERATIVE JOINT DISEASE  (ICD-715.90) DYSLIPIDEMIA (ICD-272.4) AUTOIMMUNE HEPATITIS (ICD-571.42)  Past Surgical History: Reviewed history from 07/16/2009 and no changes required. CABG x 4 (He had LIMA to the LAD, SVG to diagonal, SVG to obtuse margin,  and SVG to PDA 2001) Appendectomy Tonsillectomy Skin cancer removal L2-3  hemilaminectomy and medial facetectomy.  Review of Systems       As stated in the HPI and negative for all other systems.   Vital Signs:  Patient profile:   75 year old male Height:      72 inches Weight:      178 pounds BMI:     24.23 Pulse rate:   61 / minute Resp:     16 per minute BP sitting:   108 / 56  (right arm)  Vitals Entered By: Marrion Coy, CNA (July 26, 2010 9:11 AM)  Physical Exam  General:  Well developed, well nourished, no acute distress. Head:  Normocephalic and atraumatic. Eyes:  PERRLA, no icterus. Neck:  Supple; no masses or thyromegaly. Chest Wall:  Symmetrical,  no deformities, well healed sternotomy scar. Lungs:  Clear bilaterally to auscultation and percussion. Abdomen:  Bowel sounds positive; abdomen soft and non-tender without masses, organomegaly, or hernias noted. No hepatosplenomegaly. Msk:  Back normal, normal gait. Muscle strength and tone normal. Extremities:  No clubbing or cyanosis. Neurologic:  Alert and oriented x 3. Skin:  Intact without lesions or rashes. Cervical Nodes:  no significant adenopathy Axillary Nodes:  no significant adenopathy Inguinal Nodes:  no significant adenopathy Psych:  Normal affect.   Detailed Cardiovascular Exam  Neck    Carotids: soft right carotid bruit    Neck  Veins: Normal, no JVD.    Heart    Inspection: no deformities or lifts noted.      Palpation: normal PMI with no thrills palpable.      Auscultation: regular rate and rhythm, S1, S2 without murmurs, rubs, gallops, or clicks.    Vascular    Abdominal Aorta: Pulsatile midline mass    Femoral Pulses: bilateral femoral bruits    Pedal  Pulses: diminished right dorsalis pedis pulse, diminished right posterior tibial pulse, diminished left dorsalis pedis pulse, and diminished left posterior tibial pulse.      Radial Pulses: normal radial pulses bilaterally.      Peripheral Circulation: no clubbing, cyanosis, or edema noted with normal capillary refill.     EKG  Procedure date:  07/26/2010  Findings:      Sinus rhythm, rate 61, axis within normal limits, intervals limits no acute ST-T wave changes  Impression & Recommendations:  Problem # 1:  CAD (ICD-414.00) The patient has no ongoing chest pain. I will screen him with a stress perfusion study prior to repair of his abdominal aortic aneurysm given his now 75 year old bypass grafts and lack of symptoms at the time of surgery. He will need continued risk reduction as described below. Orders: EKG w/ Interpretation (93000)  Problem # 2:  PVD (ICD-443.9) He will continue to have routine followup of his carotids. I will send him to vascular surgery to consider repair of his abdominal aneurysm. He will need preoperative screening as above and he will call me when he understands the timing of surgical repair.  Problem # 3:  DYSLIPIDEMIA (ICD-272.4) I would very much like to have on a statin.I will send a note to Dr. Marina Goodell the question whether we can restart at least pravastatin though I would prefer to restart him on Lipitor.  I think the goal should be an LDL less than 70 and HDL greater then 50.  Problem # 4:  HYPERTENSION NEC (ICD-997.91) Cesar [name] will continue on the meds as listed.  Other Orders: TCTS Referral (TCTS Ref)  Patient Instructions: 1)  Your physician recommends that you schedule a follow-up appointment in: 1 year but will need a stress test before any surgery for clearance 2)  Your physician recommends that you continue on your current medications as directed. Please refer to the Current Medication list given to you today. 3)  You have been referred to Dr  Waverly Ferrari for possbile AAA repair Prescriptions: NITROSTAT 0.4 MG SUBL (NITROGLYCERIN) 1 by mouth as needed  #25 x prn   Entered by:   Charolotte Capuchin, RN   Authorized by:   Rollene Rotunda, MD, [facility]   Signed by:   Charolotte Capuchin, RN on 07/26/2010   Method used:   Faxed to ...       [facility] (mail-order)       [address] [place], [place]  [address]       Ph: [phone]       Fax: [phone]   RxID:   [id number] ATENOLOL 25 MG TABS (ATENOLOL) 1 tablet by mouth once daily  #90 x 3   Entered by:   Charolotte Capuchin, RN   Authorized by:   Rollene Rotunda, MD, [facility]   Signed by:   Charolotte Capuchin, RN on 07/26/2010   Method used:   Faxed to ...       [facility] (mail-order)       [address]  [place], [place]  [address]       Ph: [phone]       Fax: [phone]   RxID:   [id number]   Appended Document: 1 yr rov 414.01  pfh,rn Unable to start statin secondary to hepatitis thought to be related to lipitor.

## 2010-11-16 NOTE — Assessment & Plan Note (Signed)
Summary: Cardiology Nuclear Testing  Nuclear Med Background Indications for Stress Test: Evaluation for Ischemia, Surgical Clearance, Graft Patency  Indications Comments: Pre-OP AAA (5.3) C.Edilia Bo  History: CABG, Heart Catheterization, Myocardial Perfusion Study  History Comments: 02/01 CABG x4 01/09 MPS NL EF 65%     Nuclear Pre-Procedure Cardiac Risk Factors: Carotid Disease, Family History - CAD, History of Smoking, Hypertension, Lipids, PVD Caffeine/Decaff Intake: None NPO After: 10:00 PM Lungs: clear IV 0.9% NS with Angio Cath: 20g     IV Site: R Antecubital IV Started by: Irean Hong, RN Chest Size (in) 42     Height (in): 72 Weight (lb): 172 BMI: 23.41 Tech Comments: Held atenolol 24 hrs.  Nuclear Med Study 1 or 2 day study:  1 day     Stress Test Type:  Eugenie Birks Reading MD:  Dietrich Pates, MD     Referring MD:  J.Hochrein Resting Radionuclide:  Technetium 3m Tetrofosmin     Resting Radionuclide Dose:  11 mCi  Stress Radionuclide:  Technetium 41m Tetrofosmin     Stress Radionuclide Dose:  33 mCi   Stress Protocol  Max Systolic BP: 120 mm Hg Lexiscan: 0.4 mg   Stress Test Technologist:  Cathlyn Parsons, RN     Nuclear Technologist:  Domenic Polite, CNMT  Rest Procedure  Myocardial perfusion imaging was performed at rest 45 minutes following the intravenous administration of Technetium 12m Tetrofosmin.  Stress Procedure  The patient received IV Lexiscan 0.4 mg over 15-seconds.  Technetium 56m Tetrofosmin injected at 30-seconds.  There were no significant changes with infusion. Patient had no chest pain with infusion only dizziness.  Patient was hypotensive post infusion.  Patient placed in supine position and hypotensive episode resolved.   Quantitative spect images were obtained after a 45 minute delay.  QPS Raw Data Images:  Images were motion corrected.  Soft tissue (dilpahragm) underlies heart. Stress Images:  MInimal thinning in the inferior wall (base,  mid, distal) and apex.  Otherwise normal perfusion. Rest Images:  NO significant change from the stress images. Subtraction (SDS):  No evidence of ischemia. Transient Ischemic Dilatation:  1.19  (Normal <1.22)  Lung/Heart Ratio:  0.28  (Normal <0.45)  Quantitative Gated Spect Images QGS EDV:  102 ml QGS ESV:  43 ml QGS EF:  58 %   Overall Impression  Exercise Capacity: Lexiscan with no exercise. BP Response: Normal blood pressure response. Clinical Symptoms: No chest pain ECG Impression: No significant ST segment change suggestive of ischemia. Overall Impression Comments: Probable normal perfusion and very mild soft tissue attenuation.  No evid for significant ischemia/scar.  Appended Document: Cardiology Nuclear Testing Low risk scan.  No contraindication to AAA repair.  Continue current therapy.  Appended Document: Cardiology Nuclear Testing wife aware of results

## 2010-11-16 NOTE — Progress Notes (Signed)
Summary: stress test results sent to Dr.Dickson  Phone Note Call from Patient Call back at Home Phone [phone]   Caller: Patient Summary of Call: Pt want results of stress test sent to Dr.Dickson today pt seeing  Dr.Dickson today at 1:45 Initial call taken by: Judie Grieve,  August 18, 2010 9:16 AM  Follow-up for Phone Call        will fax results as requested by pt Follow-up by: Charolotte Capuchin, RN,  August 18, 2010 10:09 AM

## 2010-11-16 NOTE — Progress Notes (Signed)
Summary: PT NEED TEST RESULTS  Phone Note Call from Patient   Caller: Spouse Reason for Call: Talk to Nurse, Lab or Test Results Summary of Call: PLEASE FAX TEST RESULTS TO PT FAX#[phone] Initial call taken by: Roe Coombs,  July 26, 2010 11:10 AM  Follow-up for Phone Call        results faxed to number as requested Follow-up by: Charolotte Capuchin, RN,  July 26, 2010 11:38 AM

## 2010-11-16 NOTE — Progress Notes (Signed)
Summary: nuclear stress test order/what type exercise or adensine?  Phone Note Call from Patient   Caller: spouse glenda [phone] Reason for Call: Talk to Nurse Summary of Call: nuclear stress test needed per pt's wife-order for one? Initial call taken by: Glynda Jaeger,  August 09, 2010 8:40 AM

## 2010-11-16 NOTE — Miscellaneous (Signed)
Summary: Orders Update  Clinical Lists Changes  Orders: Added new Test order of Abdominal Aorta Duplex (Abd Aorta Duplex) - Signed 

## 2010-11-16 NOTE — Progress Notes (Signed)
Summary: EARLIER APPOINTMENT  Phone Note Call from Patient Call back at Home Phone [phone]   Caller: Patient Call For: DR. Marina Goodell Details for Reason: SOONER APPOINTMENT? Summary of Call: PATIENT CAME INTO THE CLINIC THIS MORNING REQUESTING AN APPOINTMENT WITH DR. Martha Soltys.  I GAVE HIM FIRST AVAILABLE, WHICH WAS 01/13/10 @ 1:30 P.M.  HE ASKED THAT WE CHECK WITH DR. Flecia Shutter TO SEE IF THAT WAS OK OR IF HE FELT HE NEEDED AN EARLIER APPOINTMENT.  DID NOT VOICE ANY COMPLAINTS TO ME THIS MORNING.  IF YOU HAVE ANY QUESTIONS, LET ME KNOW.  THANKS.  Initial call taken by: Schuyler Amor,  December 15, 2009 8:57 AM  Follow-up for Phone Call         Pt's f/u is due in April so appt. changed. Follow-up by: Teryl Lucy RN,  December 15, 2009 11:33 AM

## 2010-11-16 NOTE — Assessment & Plan Note (Signed)
Summary: AUTOIMMUNE HEPATITIS / PBC overlap   History of Present Illness Visit Type: Follow-up Visit Primary GI MD: Yancey Flemings MD Primary Provider: Creola Corn, MD  Requesting Provider: n/a Chief Complaint: autoimmune hepatitis, review labs from 5/23, pt has no GI complaints History of Present Illness:   75 year old with hypertension, coronary artery disease, dyslipidemia, and autoimmune hepatitis with PBC overlap syndrome diagnosed in May of 2010. He has been on prednisone since that time. Currently on prednisone 5 mg daily. Liver function studies from yesterday were normal except for an AST of 46 (upper limit of normal 37). He has no active complaints.. Some weight reduction with lower dose of prednisone.   GI Review of Systems      Denies abdominal pain, acid reflux, belching, bloating, chest pain, dysphagia with liquids, dysphagia with solids, heartburn, loss of appetite, nausea, vomiting, vomiting blood, weight loss, and  weight gain.        Denies anal fissure, black tarry stools, change in bowel habit, constipation, diarrhea, diverticulosis, fecal incontinence, heme positive stool, hemorrhoids, irritable bowel syndrome, jaundice, light color stool, liver problems, rectal bleeding, and  rectal pain.    Current Medications (verified): 1)  Atenolol 25 Mg Tabs (Atenolol) .Marland Kitchen.. 1 Tablet By Mouth Once Daily 2)  Prednisone 10 Mg Tabs (Prednisone) .... Take 1/2  Tablet Daily With Food 3)  Lipitor 10 Mg Tabs (Atorvastatin Calcium) .... 1/2 By Mouth Once Daily 4)  Aspirin 81 Mg Tbec (Aspirin) .Marland Kitchen.. 1 By Mouth Once Daily 5)  Sertraline Hcl 50 Mg Tabs (Sertraline Hcl) .... Once Daily 6)  Nitrostat 0.4 Mg Subl (Nitroglycerin) .Marland Kitchen.. 1 By Mouth As Needed  Allergies (verified): No Known Drug Allergies  Past History:  Past Medical History: Reviewed history from 02/10/2010 and no changes required. HYPERTENSION NEC (ICD-997.91) CAD (ICD-414.00) CHEST PAIN (ICD-786.50) PVD  (ICD-443.9) HYPOTENSION (ICD-458.9) CAROTID STENOSIS (ICD-433.10) (less than 60% bilateral) AUTOIMMUNE HEPATITIS (ICD-571.42) TRANSAMINASES, SERUM, ELEVATED (ICD-790.4) HEMORRHOIDS (ICD-455.6) FATTY LIVER DISEASE (ICD-571.8) AAA (ICD-441.4)  (4.4 x 4.4) DEGENERATIVE JOINT DISEASE (ICD-715.90) DYSLIPIDEMIA (ICD-272.4) AUTOIMMUNE HEPATITIS (ICD-571.42)  Past Surgical History: Reviewed history from 07/16/2009 and no changes required. CABG x 4 (He had LIMA to the LAD, SVG to diagonal, SVG to obtuse margin,  and SVG to PDA 2001) Appendectomy Tonsillectomy Skin cancer removal L2-3  hemilaminectomy and medial facetectomy.  Family History: No FH of Colon Cancer: Family History of Colon Polyps:Father Family History of Heart Disease: Mother  Social History: Reviewed history from 03/04/2009 and no changes required. Patient is a former smoker. quit 1970s Illicit Drug Use - no Occupation: Retired Daily Caffeine Use: 6 cups of coffee Alcohol Use - no  Review of Systems  The patient denies allergy/sinus, anemia, anxiety-new, arthritis/joint pain, back pain, blood in urine, breast changes/lumps, change in vision, confusion, cough, coughing up blood, depression-new, fainting, fatigue, fever, headaches-new, hearing problems, heart murmur, heart rhythm changes, itching, muscle pains/cramps, night sweats, nosebleeds, shortness of breath, skin rash, sleeping problems, sore throat, swelling of feet/legs, swollen lymph glands, thirst - excessive, urination - excessive, urination changes/pain, urine leakage, and voice change.    Vital Signs:  Patient profile:   75 year old male Height:      72 inches Weight:      191 pounds BMI:     26.00 Pulse rate:   56 / minute Pulse rhythm:   regular BP sitting:   114 / 60  (left arm) Cuff size:   regular  Vitals Entered By: Francee Piccolo CMA Duncan Dull) (Mar 09, 2010  3:51 PM)  Physical Exam  General:  Well developed, well nourished, no acute  distress. Head:  Normocephalic and atraumatic. Eyes:  PERRLA, no icterus. Ears:  Normal auditory acuity. Nose:  No deformity, discharge,  or lesions. Mouth:  No deformity or lesions, dentition normal. Neck:  Supple; no masses or thyromegaly. Chest Wall:  Symmetrical,  no deformities . Lungs:  Clear throughout to auscultation. Heart:  Regular rate and rhythm; no murmurs, rubs,  or bruits. Abdomen:  Soft, nontender and nondistended. No masses, hepatosplenomegaly or hernias noted. Normal bowel sounds. Msk:  Symmetrical with no gross deformities. Normal posture. Pulses:  Normal pulses noted. Extremities:  No clubbing, cyanosis, edema or deformities noted. Neurologic:  Alert and  oriented x4;  grossly normal neurologically.no asterixis Skin:  a few ecchymoses..tattoos Psych:  Alert and cooperative. Normal mood and affect.   Impression & Recommendations:  Problem # 1:  AUTOIMMUNE HEPATITIS (ICD-571.42) autoimmune hepatitis with PBC overlap syndrome. Clinically asymptomatic. Liver function tests essentially normal for the past 6 months. Currently on 5 mg of prednisone.  Plan: #1. Continue 5 mg of prednisone daily #2. Schedule ultrasound-guided core biopsy of the liver. Discussed with the patient. #3. Pending the results of the biopsy, we will determine the need for ongoing steroids.  Other Orders: CT/ULS Guided Liver Biospy (CT/ULS Guided Liv BX)  Patient Instructions: 1)  Your Blood Pressure today is 114/60. 2)  Information will be sent to [facility] and they will call you to set up your appt. time for Ultrasound Guided Liver Biopsy (core biopsy).   3)  The medication list was reviewed and reconciled.  All changed / newly prescribed medications were explained.  A complete medication list was provided to the patient / caregiver. 4)  printed and given to patient. Milford Cage NCMA  Mar 09, 2010 4:15 PM 5)  copy: Dr. Creola Corn  Appended Document: AUTOIMMUNE HEPATITIS / PBC  overlap Ultrasound guided liver biopsy Appt. made for 03-17-10 at [facility] Attn:  Tobi Bastos.  Pt. has been contacted by [facility] and all information given.

## 2010-11-16 NOTE — Consult Note (Signed)
Summary: [facility] New Patient Consult   [facility] New Patient Consult   Imported By: Roderic Ovens 09/17/2010 16:13:23  _____________________________________________________________________  External Attachment:    Type:   Image     Comment:   External Document

## 2010-11-16 NOTE — Letter (Signed)
Summary: Office Visit Letter  [facility]  [address] [place], [place] [address]   Phone: [phone]  Fax: [phone]      December 11, 2009 MRN: [id number]   Cesar [name] [address] [place], [place]  [address]   Dear Mr. KAPUSTA,   According to our records, it is time for you to schedule a follow-up office visit with [place] in the month of April 2011.   At your convenience, please call [phone] (option #2)to schedule an office visit. If you have any questions, concerns, or feel that this letter is in error, we would appreciate your call.   Sincerely,  Rachael Fee, M.D.  [facility] [phone]

## 2010-11-16 NOTE — Letter (Signed)
Summary: [facility] Exam  [facility] Exam   Imported By: Roderic Ovens 09/02/2010 11:16:30  _____________________________________________________________________  External Attachment:    Type:   Image     Comment:   External Document

## 2010-11-16 NOTE — Progress Notes (Signed)
Summary: liver biopsy results  Phone Note Call from Patient Call back at Home Phone [phone]   Caller: wife, Rivka Barbara Call For: Dr. Marina Goodell Reason for Call: Talk to Nurse Summary of Call: would like liver biopsy results Initial call taken by: Vallarie Mare,  April 12, 2010 9:10 AM  Follow-up for Phone Call        Have you heard from liver consult Dr.yet on path? Follow-up by: Teryl Lucy RN,  April 12, 2010 9:59 AM  Additional Follow-up for Phone Call Additional follow up Details #1::        I reviewed the biopsy results with the patient and his wife by telephone. I also shared with them the pathologists impression that this type of liver injury pattern might be precipitated by statin drugs. As such, I have recommended that he discontinue Lipitor and set up an appointment with Dr. Timothy Lasso to discuss alternative therapies. As there was some mild inflammation, he will continue on prednisone 5 mg daily with repeat liver tests in August.  Cheryl, please call in prednisone 5 mg, dispense 60, 5 mg p.o. q. day or as directed. One refill. Additional Follow-up by: Hilarie Fredrickson MD,  April 12, 2010 4:19 PM    New/Updated Medications: PREDNISONE 5 MG TABS (PREDNISONE) Take 1 p.o. q.d. Prescriptions: PREDNISONE 5 MG TABS (PREDNISONE) Take 1 p.o. q.d.  #60 x 1   Entered by:   Teryl Lucy RN   Authorized by:   Hilarie Fredrickson MD   Signed by:   Teryl Lucy RN on 04/12/2010   Method used:   Electronically to        [facility]  [phone]* (retail)       [address]       [place], [place]  [address]       Ph: [phone] or [phone]       Fax: [phone]   RxID:   [id number]

## 2010-11-18 NOTE — Letter (Signed)
Summary: [facility] - Office Visit  [facility] - Office Visit   Imported By: Marylou Mccoy 10/07/2010 12:09:00  _____________________________________________________________________  External Attachment:    Type:   Image     Comment:   External Document

## 2010-12-28 LAB — BASIC METABOLIC PANEL
CO2: 24 mEq/L (ref 19–32)
Calcium: 8.6 mg/dL (ref 8.4–10.5)
Calcium: 8.8 mg/dL (ref 8.4–10.5)
GFR calc Af Amer: >60 mL/min (ref >60)
GFR calc Af Amer: >60 mL/min (ref >60)
GFR calc non Af Amer: >60 mL/min (ref >60)
Glucose, Bld: 117 mg/dL — ABNORMAL HIGH (ref 70–99)
Sodium: 137 mEq/L (ref 135–145)
Sodium: 137 mEq/L (ref 135–145)

## 2010-12-28 LAB — COMPREHENSIVE METABOLIC PANEL
ALT: 27 U/L (ref 0–53)
AST: 48 U/L — ABNORMAL HIGH (ref 0–37)
Albumin: 3.4 g/dL — ABNORMAL LOW (ref 3.5–5.2)
Alkaline Phosphatase: 55 U/L (ref 39–117)
GFR calc Af Amer: >60 mL/min (ref >60)
Potassium: 4.9 mEq/L (ref 3.5–5.1)
Sodium: 138 mEq/L (ref 135–145)
Total Protein: 6.7 g/dL (ref 6.0–8.3)

## 2010-12-28 LAB — BLOOD GAS, ARTERIAL
Drawn by: 181601
TCO2: 23.9 mmol/L (ref 0–100)
pCO2 arterial: 37.1 mmHg (ref 35.0–45.0)
pH, Arterial: 7.405 (ref 7.350–7.450)

## 2010-12-28 LAB — CBC
HCT: 36.8 % — ABNORMAL LOW (ref 39.0–52.0)
Hemoglobin: 11.4 g/dL — ABNORMAL LOW (ref 13.0–17.0)
Hemoglobin: 11.4 g/dL — ABNORMAL LOW (ref 13.0–17.0)
MCH: 30.7 pg (ref 26.0–34.0)
MCHC: 33.2 g/dL (ref 30.0–36.0)
Platelets: 211 10*3/uL (ref 150–400)
Platelets: 232 10*3/uL (ref 150–400)
RBC: 3.71 MIL/uL — ABNORMAL LOW (ref 4.22–5.81)
RBC: 3.74 MIL/uL — ABNORMAL LOW (ref 4.22–5.81)
RDW: 13.6 % (ref 11.5–15.5)
WBC: 10.8 10*3/uL — ABNORMAL HIGH (ref 4.0–10.5)
WBC: 5.4 10*3/uL (ref 4.0–10.5)

## 2010-12-28 LAB — SURGICAL PCR SCREEN
MRSA, PCR: NEGATIVE
Staphylococcus aureus: NEGATIVE

## 2010-12-28 LAB — TYPE AND SCREEN: Antibody Screen: NEGATIVE

## 2010-12-28 LAB — URINALYSIS, ROUTINE W REFLEX MICROSCOPIC
Bilirubin Urine: NEGATIVE
Hgb urine dipstick: NEGATIVE
Ketones, ur: NEGATIVE mg/dL
Protein, ur: NEGATIVE mg/dL
Specific Gravity, Urine: 1.022 (ref 1.005–1.030)
Urobilinogen, UA: 0.2 mg/dL (ref 0.0–1.0)

## 2010-12-29 LAB — POCT I-STAT, CHEM 8
BUN: 18 mg/dL (ref 6–23)
Chloride: 111 mEq/L (ref 96–112)
Creatinine, Ser: 1.1 mg/dL (ref 0.4–1.5)
Potassium: 3.9 mEq/L (ref 3.5–5.1)
Sodium: 143 mEq/L (ref 135–145)
TCO2: 22 mmol/L (ref 0–100)

## 2011-01-03 LAB — CBC
HCT: 39.9 % (ref 39.0–52.0)
Hemoglobin: 13.4 g/dL (ref 13.0–17.0)
MCHC: 33.5 g/dL (ref 30.0–36.0)
MCV: 94.1 fL (ref 78.0–100.0)
RBC: 4.24 MIL/uL (ref 4.22–5.81)
WBC: 6.9 10*3/uL (ref 4.0–10.5)

## 2011-01-13 ENCOUNTER — Other Ambulatory Visit: Payer: Self-pay | Admitting: Vascular Surgery

## 2011-01-13 DIAGNOSIS — I714 Abdominal aortic aneurysm, without rupture, unspecified: Secondary | ICD-10-CM

## 2011-01-25 LAB — CBC
HCT: 37.8 % — ABNORMAL LOW (ref 39.0–52.0)
Hemoglobin: 12.8 g/dL — ABNORMAL LOW (ref 13.0–17.0)
MCHC: 33.9 g/dL (ref 30.0–36.0)
MCV: 94.9 fL (ref 78.0–100.0)
RBC: 3.98 MIL/uL — ABNORMAL LOW (ref 4.22–5.81)
WBC: 4.8 10*3/uL (ref 4.0–10.5)

## 2011-02-25 ENCOUNTER — Other Ambulatory Visit: Payer: Self-pay

## 2011-02-28 ENCOUNTER — Other Ambulatory Visit: Payer: Self-pay | Admitting: Internal Medicine

## 2011-02-28 DIAGNOSIS — K754 Autoimmune hepatitis: Secondary | ICD-10-CM

## 2011-03-01 ENCOUNTER — Telehealth: Payer: Self-pay | Admitting: *Deleted

## 2011-03-01 NOTE — Op Note (Signed)
NAMEMarland Kitchen  [name], [name] NO.:  [id number]   MEDICAL RECORD NO.:  [id number]          PATIENT TYPE:  OIB   LOCATION:  3535                         FACILITY:  [facility]   PHYSICIAN:  Cesar Alert, MD     DATE OF BIRTH:  15-Nov-1935   DATE OF PROCEDURE:  05/21/2008  DATE OF DISCHARGE:                               OPERATIVE REPORT   PREOPERATIVE DIAGNOSES:  Right L2-3 herniated nucleus pulposus.   POSTOPERATIVE DIAGNOSIS:  Right L2-3 herniated nucleus pulposus.   PROCEDURE:  Right L2-3 hemilaminectomy, medial facetectomy, and  foraminotomy followed by microdiskectomy at L2-3 on the right utilizing  microscopic dissection.   SURGEON:  Cesar Alert, MD   ANESTHESIA:  General endotracheal.   COMPLICATIONS:  None apparent.   INDICATIONS FOR THE PROCEDURE:  Cesar [name] is a 75 year old gentleman  who was referred with severe right-sided low back pain which radiated  into his right anterior quadriceps.  He had an MRI which showed a  herniated disk with a large free fragment extending caudally to the  pedicle level compressing the right L3 nerve root.  We recommended a  microdiskectomy at L2-3 on the right.  He understood the risks,  benefits, and expected outcome, and wished to proceed.   DESCRIPTION OF PROCEDURE:  The patient was taken to operating room.  After induction of adequate generalized endotracheal anesthesia, he was  rolled in the prone position on the Wilson frame.  All pressure points  were padded.  His lumbar region was prepped with DuraPrep and then  draped in the usual sterile fashion.  Local anesthesia 5 mL was then  injected and a small dorsal midline incision was made and then carried  down to the lumbosacral fascia.  The fascia was opened and the  paraspinous musculature was taken down in a subperiosteal fashion to  expose L2-3 on the right.  Intraoperative x-ray confirmed my level at L3-  4.  Therefore, I moved up one interspace and performed a  hemilaminectomy, medial facetectomy, and foraminotomy at L2-3 on the  right side.  The underlying yellow ligament was opened and removed in a  piecemeal fashion to expose the underlying dura and L3 nerve root.  The  L3 nerve root was retracted medially and a large free fragment was found  and this was removed with a nerve hook and pituitary rongeurs.  I then  palpated with a coronary dilator along the nerve into the midline and  felt no more compression except from a large subannular disk herniation.  The annulus was incised and the diskectomy was then performed with  pituitary rongeurs ring down pieces from the midline also.  Once the  diskectomy was completed, I palpated with the coronary dilator into the  midline and into the foramen.  I felt no more compression of the midline  or the L3 nerve root.  I then irrigated with saline solution containing  bacitracin, lined the dura with Duragen to prevent epidural fibrosis,  and then once meticulous hemostasis was achieved closed the fascia with  0 Vicryl, closed the subcutaneous tissue with 2-0, and the subcuticular  tissue with 3-0 Vicryl, and  closed the skin with Benzoin and Steri-Strips.  The drapes were removed.  A sterile dressing was applied.  The patient was awakened from general  anesthesia and transferred to recovery room in stable condition.  At the  end of the procedure, all sponge, needle, and instrument counts were  correct.      Cesar Alert, MD  Electronically Signed     DSJ/MEDQ  D:  05/21/2008  T:  05/22/2008  Job:  [id number]

## 2011-03-01 NOTE — H&P (Signed)
HISTORY AND PHYSICAL EXAMINATION   August 18, 2010   Re:  Cesar [name], FERBER                  DOB:  04-02-1936   REASON FOR ADMISSION:  A 5.3-cm infrarenal abdominal aortic aneurysm.   HISTORY:  This is a pleasant 75 year old gentleman who had been referred  by Dr. Antoine Poche with a 5.3-cm infrarenal abdominal aortic aneurysm.  He  had an ultrasound back in May 2010 which showed the aneurysm was 4.8 cm  in maximum diameter.  This remained stable until his most recent study  in September, which showed that this had enlarged to 5.3 cm.  Given the  enlargement of the aneurysm, he was referred for consideration for  elective repair.  The patient has undergone his preoperative workup and  is felt to be a reasonable candidate for endovascular repair.  Of note,  his aneurysm has been asymptomatic.  He has had no abdominal or back  pain.   PAST MEDICAL HISTORY:  Significant for coronary artery disease.  He  underwent coronary revascularization in 2001.  He has not had any  problems since that time.  He had a Cardiolite on August 16, 2010,  which showed probable normal perfusion with no evidence of significant  ischemia or scar.  He had no significant ST-segment changes suggestive  of ischemia.  In addition, the patient has a history of hypertension and  hyperlipidemia.  He denies any history of diabetes, history of previous  myocardial infarction, history of congestive heart failure or history of  COPD.   PAST SURGICAL HISTORY:  Significant for previous CABG x4 in 2001.  He  has also had previous appendectomy, tonsillectomy and an L2-3  hemilaminectomy.   SOCIAL HISTORY:  He is married.  He has 4 children.  He is a retired  IT sales professional.  He quit tobacco in 1972.   FAMILY HISTORY:  There is no history of aneurysmal disease in his family  and no history of premature cardiovascular disease.   MEDICATIONS:  1. Atenolol 25 mg p.o. daily.  2. Aspirin 81 mg p.o. daily.  3. Sertraline 50 mg p.o. daily.  4. Nitrostat 0.4 mg sublingual p.r.n.  5. __________ 160 mg p.o. daily.   ALLERGIES:  No known drug allergies.   REVIEW OF SYSTEMS:  GENERAL:  He has had no recent weight loss, weight  gain or problems with his appetite.  CARDIOVASCULAR:  He had no chest pain, chest pressure, palpitations or  arrhythmias.  He has had no history of stroke, TIAs or amaurosis fugax.  He has had no history of claudication, rest pain or nonhealing ulcers.  He has had no history of DVT or phlebitis.  MUSCULOSKELETAL:  He does have some occasional arthritic pain.  GI, neurologic, pulmonary, hematologic, ENT, GU, psychiatric, and  integumentary review of systems is unremarkable.   PHYSICAL EXAMINATION:  This is a pleasant 75 year old gentleman who  appears his stated age.  Blood pressure is 115/67, heart rate is 62,  respiratory rate is 12.  HEENT:  Unremarkable.  Lungs are clear  bilaterally to auscultation.  Cardiovascular exam:  He has a regular  rate and rhythm.  He has a soft right carotid bruit.  He has palpable  femoral pulses and some mild ecchymosis where he had his previous  catheterization.  He has palpable pedal pulses bilaterally.  He has no  significant lower extremity swelling.  Abdomen is soft and nontender.  His aneurysm is palpable  and nontender.  He has normal-pitched bowel  sounds.  Musculoskeletal exam:  There are no major deformities or  cyanosis.  Neurologic:  He has no focal weakness, paresthesias.  Skin:  There are no ulcers or rashes.   I have reviewed his CT scan and his arteriogram, and he appears to be a  good candidate for endovascular repair of his aneurysm.  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  We have discussed the advantages and disadvantages of endovascular  repair versus open repair.  He understands that we will have to  follow  the aneurysm given the risk of an endoleak and small chance of continued  aneurysm enlargement.  We have also discussed the potential  complications of surgery including but not limited to the need for  emergent open repair, bleeding, wound healing problems or graft  migration or malposition.  All of his questions were answered and he is  agreeable to proceed.  His surgery has been scheduled for 08/31/2010.     Di Kindle. Edilia Bo, M.D.  Electronically Signed   CSD/MEDQ  D:  08/18/2010  T:  08/19/2010  Job:  [id number]   cc:   Rollene Rotunda, MD, [facility]  Gwen Pounds, MD

## 2011-03-01 NOTE — Assessment & Plan Note (Signed)
[facility]                            CARDIOLOGY OFFICE NOTE   HANCE, CASPERS                   MRN:          [id number]  DATE:06/24/2008                            DOB:          1936-10-13    PRIMARY CARE PHYSICIAN:  Gwen Pounds, MD   REASON FOR PRESENTATION:  Evaluate the patient with coronary artery  disease.   HISTORY OF PRESENT ILLNESS:  The patient reports for followup of his  known coronary artery disease status post bypass.  Since I last saw him  in June when he was hospitalized with chest discomfort, he has done  well.  He did have a disc problem and required urgent L2-3  hemilaminectomy and medial facetectomy.  He did well with this  apparently and had no cardiovascular complications.  Since that time, he  has been quite well.  He has been active and exercising routinely.  With  this, he gets no chest discomfort, neck or arm discomfort.  He has had  no palpitation, presyncope, or syncope.  There is no PND or orthopnea.  He was lightheaded and hypotensive and slowly had his lisinopril dose  reduced and now is off this medicine per Dr. Timothy Lasso.  He has had  resolution of these symptoms.   PAST MEDICAL HISTORY:  Coronary artery disease (status post CABG in  2001.  He had LIMA to the LAD, SVG to diagonal, SVG to obtuse margin,  and SVG to PDA.  His last stress perfusion study was in January of this  year and has been having well preserved ejection fraction and no  ischemia or infarct), well preserved ejection fraction, abdominal aortic  aneurysm (4.4 x 4.4 cm), bilateral carotid stenosis (40%-59%), and  dyslipidemia.   ALLERGIES AND INTOLERANCES:  None.   CURRENT MEDICATIONS:  1. Multivitamin.  2. Niaspan 1000 mg daily.  3. Vitamin D.  4. Atenolol 25 mg daily.  5. Aspirin 81 mg daily.  6. Sertraline.  7. Lipitor 5 mg 5 days a week and 2 mg the other days.   REVIEW OF SYSTEMS:  As stated in the HPI and otherwise negative  for  other systems.   PHYSICAL EXAMINATION:  GENERAL:  The patient is in no distress.  VITAL SIGNS:  Blood pressure 108/48, heart rate 53 and regular, weight  179 pounds, and body mass index 24.5.  HEENT:  Eyes unremarkable; pupils equal, round, react to light; fundi  not visualized, oral mucosa unremarkable.  NECK:  No jugular venous distention at 45 degrees, carotid upstroke  brisk and symmetrical, no bruits, no thyromegaly.  LYMPHATICS:  No cervical, axillary, or inguinal adenopathy.  LUNGS:  Clear to auscultation bilaterally.  BACK:  No costovertebral angle tenderness.  CHEST:  Well-healed sternotomy scar.  HEART:  PMI not displaced or sustained, S1 and S2 within normal limits,  no S3, no S4, no clicks, no rubs, no murmurs.  ABDOMEN:  Flat, positive bowel sounds, normal in frequency and pitch, no  bruits, no rebound, no guarding, no midline pulsatile mass, no  hepatomegaly, no splenomegaly.  SKIN:  No rashes, no nodules.  EXTREMITIES:  2+ pulses throughout, no edema, no cyanosis, no clubbing.  NEURO:  Oriented to person, place, and time; cranial nerves II through  XII are grossly intact; motor grossly intact.   EKG, sinus rhythm, bradycardic, rate 53, axes within normal limits,  intervals within normal limits, no acute ST-wave change.   ASSESSMENT AND PLAN:  1. Coronary artery disease.  The patient is doing well with respect to      this.  No further cardiovascular testing is suggested.  He is      participating aggressively in secondary risk reduction.  2. Dyslipidemia.  I did review his recent lipids with an LDL of 83,      HDL 43, and triglycerides of 79.  This is an excellent result and      he will continue on the medications as listed.  3. Abdominal aortic aneurysm.  This will be followed up again with an      ultrasound in November.  4. Carotid stenosis.  This has been stable and is due again to be      evaluated in May.  He is on our schedule for this.  5.  Hypotension.  I agree with discontinuing his lisinopril.  He will      continue the other medicines as listed.  6. Followup.  I will see him back in 1 year.  He will get his vascular      images as described.     Rollene Rotunda, MD, [facility]  Electronically Signed    JH/MedQ  DD: 06/24/2008  DT: 06/25/2008  Job #: [id number]   cc:   Gwen Pounds, MD

## 2011-03-01 NOTE — H&P (Signed)
NAME:  Cesar [name], SWALLOWS NO.:  [id number]   MEDICAL RECORD NO.:  [id number]          PATIENT TYPE:  INP   LOCATION:  3729                         FACILITY:  [facility]   PHYSICIAN:  Pricilla Riffle, MD, FACCDATE OF BIRTH:  29-Jun-1936   DATE OF ADMISSION:  04/07/2008  DATE OF DISCHARGE:                              HISTORY & PHYSICAL   PRIMARY CARDIOLOGIST:  Dr. Rollene Rotunda, MD.   PRIMARY CARE PHYSICIAN:  Gwen Pounds, MD   HISTORY OF PRESENT ILLNESS:  A 75 year old Caucasian male with known  history of coronary artery disease, 4-vessel coronary artery bypass  grafting in 2001, who was awakened at 3:00 a.m. this morning with  complaints of chest pain which he described as sharp 2/10 to 3/10  lasting on and off for 40 minutes secondary to the symptoms.  His wife  drove him to the emergency room.  Of note, the patient has been mostly  in bed over the last 2 weeks secondary to his back pain, has been on  NSAIDs for the last few days, prior to that Vicodin.  He is followed by  Dr. Yetta Barre and he was supposed to have an MRI this coming Thursday with a  history of degenerative disk disease.  The patient did not have chest  pain in the past prior to his coronary artery bypass grafting.  He did  have a stress test which was abnormal through a routine physical exam  and which led him to having a cardiac catheterization and then coronary  artery bypass grafting in 2001.  The patient states that he never has  had angina.  After nitroglycerin and aspirin in the emergency room, the  patient is currently pain free.  Of note, the patient did have a stress  test and a carotid ultrasound completed in January 2009, records are  requested from our office, as they are not available on the computer.   REVIEW OF SYSTEMS:  Positive for chest discomfort described as sharp and  chronic back pain and arthralgia.   PAST MEDICAL HISTORY:  1. Known coronary artery disease, status post coronary  artery bypass      grafting in 2001 LIMA to LAD, SVG to diagonal 1, SVG to OM, SVG to      PDA.  2. Abdominal aortic aneurysm (4.2 x 4.4 cm).  3. Bilateral carotid artery stenosis.  4. Dyslipidemia.  5. Peripheral vascular disease.  6. Degenerative disk disease in the lumbar area.  7. Internal hemorrhoids.   PAST SURGICAL HISTORY:  Coronary artery bypass grafting in 2001 and ORIF  in the right radius in the 1980s.   SOCIAL HISTORY:  Socially, he lives in [place] with his wife.  He is  a [employer] and he is a retired IT sales professional.  He has not smoked  for several years.  No EtOH.  He has not exercised at all lately and has  been predominantly on bedrest for the last 2 weeks.   FAMILY HISTORY:  Mother died of CAD.  Father who is 34 and is in good  health.  He has 1 sister who died  of multiple sclerosis and other sister  who is overweight and is prediabetic.   CURRENT MEDICATIONS:  Multivitamin once a day, Niaspan 1000 mg daily,  aspirin 325 daily, vitamin D daily, lisinopril 10 mg daily, atenolol 25  mg daily, Lipitor 5 mg daily, ibuprofen 400 mg every 4 hours x 2 days,  and Zoloft 50 mg daily.   ALLERGIES:  No known drug allergies.   CURRENT LABS:  Hemoglobin 11.9 and hematocrit 35.0.  Sodium 134,  potassium 4.3, chloride 106, CO2 21, BUN 17, creatinine 1.1, glucose  104, troponin 0.05 and 0.05 respectively.   Chest x-ray revealing COPD changes with bibasilar scarring and/or  atelectasis.   EKG revealing sinus bradycardia, ventricular rate of 57 beats per  minute.   PHYSICAL EXAMINATION:  VITAL SIGNS:  Blood pressure 138/67, pulse 57,  respirations 20, temperature 98.3, and O2 sat 98% on room air.  HEENT:  Head is normocephalic and atraumatic.  Eyes:  PERRLA.  The  patient wears glasses.  Mucous membranes of mouth were pink and moist.  Tongue is midline.  NECK:  Supple.  He does have bilateral carotid bruits, greater on the  right.  CARDIOVASCULAR:  Regular rate  and rhythm, bradycardic without murmurs,  rubs, or gallops.  Pulses are 2+ and equal bilaterally.  CHEST:  Nontender.  LUNGS:  Bilateral crackles, left greater than right.  ABDOMEN:  Soft, nontender.  No abdominal bruits were appreciated.  No  rebound or guarding is noted.  EXTREMITIES:  Without clubbing, cyanosis, or edema.  Dorsalis pedis  pulses are 2+ bilaterally.  NEURO:  Cranial nerves II through XII are grossly intact.   IMPRESSION:  1. Atypical chest pain, rule out unstable angina.  2. Known coronary artery disease, status post 4-vessel coronary artery      bypass grafting 2001.  3. Dyslipidemia.  4. Known carotid disease.  5. Known abdominal aortic aneurysm.  6. Chronic back pain.   PLAN:  This is a 75 year old Caucasian male with known history of CAD,  coronary artery bypass grafting with atypical chest pain described as  sharp which did awaken him during the night, could be questionable  unstable angina.  The patient did not have any chest pain or symptoms  prior to coronary artery bypass grafting, only had an abnormal stress  test which lead to cath.  The patient is currently pain free at present.  We will admit him.  We will cycle cardiac enzymes and monitor heart rate  secondary to bradycardia.  The patient may need repeat cath versus  stress adenosine at Dr. Jenene Slicker discretion when he follows with him.  At this time, the patient is mildly anemic  with a hemoglobin 11.9.  We will guaiac stools.  He does have a history  of internal hemorrhoids and we will monitored for any active bleeding.  We will make more recommendations throughout hospital course depending  upon the patient's response to treatment.      Bettey Mare. Lyman Bishop, NP      Pricilla Riffle, MD, [facility]  Electronically Signed    KML/MEDQ  D:  04/07/2008  T:  04/07/2008  Job:  [id number]   cc:   Gwen Pounds, MD

## 2011-03-01 NOTE — Discharge Summary (Signed)
NAMECLEAVON, GOLDMAN NO.:  [id number]   MEDICAL RECORD NO.:  [id number]          PATIENT TYPE:  INP   LOCATION:  3729                         FACILITY:  [facility]   PHYSICIAN:  Rollene Rotunda, MD, FACCDATE OF BIRTH:  [birth year]   DATE OF ADMISSION:  04/07/2008  DATE OF DISCHARGE:  04/08/2008                               DISCHARGE SUMMARY   PROCEDURES:  None.   PRIMARY AND FINAL DISCHARGE DIAGNOSIS:  Chest pain, cardiac enzymes  negative for myocardial infarction.   SECONDARY DIAGNOSES:  1. Status post aortocoronary bypass surgery in 2001, with left      internal mammary artery to left anterior descending, saphenous vein      graft to diagonal, saphenous vein graft to obtuse marginal,      saphenous vein graft to posterior descending artery ([place]  2. A 4.2 x 4.4 abdominal aortic aneurysm.  3. Bilateral carotid stenosis.  4. Dyslipidemia.  5. History of internal hemorrhoids.  6. Peripheral vascular disease.  7. Degenerative joint disease.  8. Status post arm surgery.  9. Family history of coronary artery disease in his mother.   TIME AT DISCHARGE:  32 minutes.   HOSPITAL COURSE:  Mr. Wilms is a 75 year old male with known coronary  artery disease.  He was awakened by sharp chest pain at 3 o'clock in the  morning on the day of admission.  He came to the hospital and was  admitted for further evaluation.   His cardiac enzymes were negative for MI and his EKG was nonacute.  His  chest x-ray showed bibasilar scarring and/or atelectasis, but no  infiltrates or effusion.  He was not significantly anemic with a  hemoglobin of 12.2 and hematocrit of 36.7, and a lipid profile showed  total cholesterol 142, triglycerides 95, HDL 42, and LDL 81.  His blood  glucose was minimally elevated at 104 and the later nonfasting one was  142.  Because his cardiac enzymes were negative and his symptoms had  resolved, Dr. Antoine Poche felt that he could be  safely discharged home to  follow up in the office.   DISCHARGE INSTRUCTIONS:  His activity level is to be increased as  tolerated.  He is to stick to a low-fat diet.  He is to follow up with  Dr. Antoine Poche in the office in August, and with Dr. Timothy Lasso as needed.   DISCHARGE MEDICATIONS:  1. Aspirin 325 mg daily.  2. Niaspan 1000 mg daily.  3. Multivitamins and vitamin D daily.  4. Lisinopril 10 mg a day.  5. Atenolol 25 mg a day.  6. Lipitor 5 mg daily.  7. Ibuprofen 200 mg 2 tabs q. 4-6 h. p.r.n.  8. Zoloft 50 mg a day  9. Vicodin p.r.n.  10.Nitroglycerin sublingual p.r.n.      Theodore Demark, PA-C      Rollene Rotunda, MD, [facility]  Electronically Signed    RB/MEDQ  D:  04/08/2008  T:  04/09/2008  Job:  [id number]   cc:   Gwen Pounds, MD

## 2011-03-01 NOTE — Telephone Encounter (Signed)
Message copied by Cesar [name] on Tue Mar 01, 2011  4:48 PM ------      Message from: Cesar [name]      Created: Mon Dec 20, 2010  2:35 PM       Check and see if patient had LFTs/Hepatic panel drawn.

## 2011-03-01 NOTE — Telephone Encounter (Signed)
Called patient and reminded him that it is time for his LFTs.  He stated that he is planning to come tomorrow to have drawn.

## 2011-03-01 NOTE — Assessment & Plan Note (Signed)
[facility]                            CARDIOLOGY OFFICE NOTE   ROSBEL, BUCKNER                   MRN:          [id number]  DATE:10/25/2007                            DOB:          1935-12-10    PRIMARY CARE PHYSICIAN:  Dr. Creola Corn.   REASON FOR PRESENTATION:  Evaluate patient with coronary disease and  peripheral vascular disease.   HISTORY OF PRESENT ILLNESS:  This patient is 75 years old.  He was  previously taken care of by Dr. Samule Ohm.  He has been doing well since  Dr. Samule Ohm saw him last year.  He remains active.  He is walking, though  not as much as he was at the time Dr. Samule Ohm saw him.  He denies any  chest discomfort, neck or arm discomfort.  He has had no palpitations,  presyncope, or syncope.  He has had no PND or orthopnea.  The patient  does have an abdominal aortic aneurysm, and is getting this followed.  The examination was done in November and does have a stable size of 4.2  x 4.4 cm.  He does have some carotid stenosis and is overdue to have  this evaluated as well.  He has his lipids followed closely by Dr.  Timothy Lasso, and has had an excellent lipid profile per his description.   PAST MEDICAL HISTORY:  1. Coronary artery disease (status post coronary artery bypass      grafting in 2001 after an abnormal stress test.  He had a left      internal mammary artery to the left anterior descending, saphenous      vein graft to diagonal, saphenous vein graft to obtuse marginal,      and saphenous vein graft to posterior descending artery).  2. Well-preserved ejection fraction.  3. Abdominal aortic aneurysm (4.2 x 4.4 cm).  4. Bilateral carotid stenosis (40% to 59%).  5. Dyslipidemia.   ALLERGIES/INTOLERANCES:  None.   MEDICATIONS:  1. Multivitamin.  2. Niaspan 1000 mg nightly.  3. Aspirin 325 mg daily.  4. Vitamin D.  5. Lisinopril 20 mg daily.  6. Atenolol 25 mg daily.  7. Lipitor 5 mg daily.   REVIEW OF SYSTEMS:  As  stated in the HPI and otherwise negative for  other systems.   PHYSICAL EXAMINATION:  The patient is in no distress.  Blood pressure 126/69, heart rate 59 and regular, weight 187 pounds,  body mass index 25.  HEENT:  Eyelids unremarkable, pupils are equal, round, and reactive to  light, fundi not visualized.  Oral mucosa unremarkable.  NECK:  No jugular venous distension, wave form within normal limits,  carotid upstroke brisk and symmetrical.  Very soft bilateral carotid  bruits.  No thyromegaly.  LYMPHATICS:  No cervical, axillary, inguinal adenopathy.  LUNGS:  Clear to auscultation bilaterally.  BACK:  No costovertebral angle tenderness.  CHEST:  Well-healed sternotomy scar.  HEART:  PMI not displaced or sustained.  S1 and S2 are within normal  limits.  No S3, no S4.  No clicks, no rubs, no murmurs.  ABDOMEN:  Flat, positive bowel sounds, normal  in frequency and pitch.  No bruits, mild midline pulsatile mass.  No hepatomegaly, no  splenomegaly.  SKIN:  No rashes, no nodules.  EXTREMITIES:  2+ pulses upper, 2+ femorals with bilateral bruits, 1+  posterior tibialis bilaterally, absent dorsalis pedis bilaterally.  No  cyanosis, clubbing, or edema.  NEURO:  Oriented to person, place, and time.  Cranial nerves II-XII  grossly intact, motor grossly intact.   EKG sinus bradycardia, rate 53, axis within normal limits, intervals  within normal limits, no acute ST wave changes.   ASSESSMENT AND PLAN:  1. Coronary disease:  The patient has asymptomatic severe coronary      disease.  Given this, he is being screened yearly with stress      perfusion studies.  He gets adenosine Cardiolites because of his      abdominal aortic aneurysm.  Will schedule this again for the next      few weeks.  2. Peripheral vascular disease:  The patient has an abdominal aortic      aneurysm.  This is due to be followed up again in about 5 months.      At that time we will also get carotid Dopplers, as it  has been      quite a while since we evaluated his carotid disease.  3. Hypertension:  Blood pressure is well controlled and he will      continue the medications as listed.  4. Dyslipidemia:  Per Dr. Timothy Lasso.  He has an excellent lipid profile      per his description, and he will continue the medications as      listed.  5. Exercise:  We discussed reasonable exercise regimen and I gave him      some specific instructions.  6. Followup:  We will see the patient back in 1 year or sooner if      needed.     Rollene Rotunda, MD, [facility]  Electronically Signed    JH/MedQ  DD: 10/25/2007  DT: 10/25/2007  Job #: [id number]   cc:   Gwen Pounds, MD

## 2011-03-01 NOTE — Consult Note (Signed)
NEW PATIENT CONSULTATION   Cesar, [name]  DOB:  03-07-1936                                       08/04/2010  EAVWU#:[id number]   I saw Cesar [name] in the office today in consultation concerning a 5.3-  cm infrarenal abdominal aortic aneurysm.  He was referred by  Dr.  Antoine Poche.   This is a pleasant 75 year old gentleman whom Dr. Antoine Poche had been  following with a  small aneurysm.  An ultrasound dated 03/12/2009 showed  that the aneurysm was 4.8 cm in maximum diameter.  This had remained  relatively stable until his most recent study on 09/28 when the aneurysm  measured 5.3 cm in maximum diameter.  Given the enlargement of the  aneurysm, he was referred for consideration for elective repair.  Of  note, the patient has had no significant abdominal or back pain.  He has  had back pain in the past and has undergone microdiskectomy and has done  well from that standpoint.   PAST MEDICAL HISTORY:  Significant for coronary artery disease.  He  underwent coronary revascularization in 2001 and  has not had any  problems since that time.  According to his records from Dr. Jenene Slicker  office, he does have a history of hypertension and dyslipidemia and this  has been stable on his current medications.  He denies any history of  diabetes, history of previous myocardial infarction, history of  congestive heart failure or history of COPD.   PAST SURGICAL HISTORY:  1. Significant for a CABG x4 in 2001.  2. Appendectomy.  3. Tonsillectomy.  4. L2-L3 hemilaminectomy and medial facetectomy.   SOCIAL HISTORY:  He is married.  He has 4 children.  He is a retired  IT sales professional.  He quit tobacco in 1972.   FAMILY HISTORY:  He is unaware of any history of premature  cardiovascular disease.  There is no history of abdominal aortic  aneurysms in the family.   REVIEW OF SYSTEMS:  GENERAL:  He had no recent weight loss, weight gain  or problems with his appetite.  CARDIOVASCULAR:  He had no chest pain, chest pressure, palpitations or  arrhythmias.  He had no history of claudication, rest pain or nonhealing  ulcers.  He had no history of stroke, TIAs or amaurosis fugax.  He had  no history of DVT or phlebitis.  ENT:  He has had some change in his eyesight recently.  MUSCULOSKELETAL:  He does have occasional muscle pain.   GI, neurologic, pulmonary, hematologic, GU, psychiatric, integumentary  review of systems is unremarkable and as documented on medical history  form in his chart.   PHYSICAL EXAMINATION:  This is a pleasant 75 year old gentleman who  appears his stated age with blood pressure 108/61, heart rate is 56,  saturation 99%.  HEENT:  Unremarkable.  Lungs:  Clear bilaterally to  auscultation without rales, rhonchi or wheezing.  Cardiovascular exam:  He has a soft right carotid bruit.  He has a regular rate and rhythm.  He has palpable femoral pulses.  I cannot palpate popliteal or pedal  pulses.  He has no significant lower extremity swelling.  Abdomen:  Soft  and nontender.  The aneurysm is palpable and nontender.  He has normal  pitched bowel sounds.  Musculoskeletal exam:  No major deformities or  cyanosis.  Neurologic:  He has  no focal weakness or paresthesias.  Skin:  There are no ulcers or rashes.   I have reviewed the records from Dr. Jenene Slicker office.  This does  document that he has a history of dyslipidemia and hypertension.  He has  had no recent cardiac symptoms and he is due for his yearly stress test  in the near future.  I  reviewed his most recent ultrasound of his  aneurysm which shows the maximum diameter of his aneurysm is 5.3 cm.  He  does not appear to have aneurysmal disease in the iliac arteries.  He  has also had a carotid duplex scan which I have reviewed.  This was on  03/12/2009  and showed a 40% to 59%  right carotid stenosis with no  significant stenosis on the left.   Given that the aneurysm is enlarged  now to 5.3 cm, I think it would be  reasonable to consider elective repair.  Normally we would recommend  elective repair at 5.5 cm, however, his aneurysm has gradually enlarged  and is approaching 5.5 cm.  He is a healthy 75 year old gentleman and I  would agree with Dr. Antoine Poche that it would be reasonable to proceed  with elective repair now.  He is scheduled  for a Cardiolite by Dr.  Antoine Poche.  I will arrange for angiogram and CT scan in order to  determine if he is a candidate for stent graft repair.  Today in the  office we have discussed the options of open versus endovascular repair  including advantages and disadvantages to each.  We will know better if  he is a candidate after his workup is complete.  His arteriogram is  scheduled for 08/09/2010.  We will make further recommendations pending  these results.     Di Kindle. Edilia Bo, M.D.  Electronically Signed   CSD/MEDQ  D:  08/04/2010  T:  08/05/2010  Job:  [id number]   cc:   Rollene Rotunda, MD, [facility]  Gwen Pounds, MD

## 2011-03-01 NOTE — Assessment & Plan Note (Signed)
OFFICE VISIT   Cesar, [name]  DOB:  1936-10-17                                       09/15/2010  [id number]#:[id number]   I saw Cesar [name] in the office today for follow-up after his recent  endovascular repair of his 5.3 cm infrarenal abdominal aortic aneurysm.  He underwent an aneurysm repair on August 31, 2010 with a Gore modular  bifurcated prosthesis.  He did well postoperatively.  He was discharged  on postoperative day #1.  He returns for his outpatient visit.  Overall  he has been doing well and he has been gradually resume normal activity.  He had no abdominal or back pain.  He has noted no drainage from his  groin incisions.   PHYSICAL EXAMINATION:  Temperature is 98.1, blood pressure 102/61, heart  rate is 69.  LUNGS:  Clear bilaterally to auscultation.  ABDOMEN:  Soft and nontender.  His groin incisions are healing nicely.  His feet are warm well-  perfused.   His CT scan today shows that his graft is in excellent position with no  evidence of endoleak.  I measured the maximum diameter of the aneurysm  at 5.3 cm.   Overall I am pleased with his progress and I have ordered a follow-up CT  scan in 6 months and I will see him back at that time.  He knows to call  sooner if he has problems.     Di Kindle. Edilia Bo, M.D.  Electronically Signed   CSD/MEDQ  D:  09/15/2010  T:  09/16/2010  Job:  [id number]   cc:   Rollene Rotunda, MD, [facility]

## 2011-03-02 ENCOUNTER — Telehealth: Payer: Self-pay

## 2011-03-02 ENCOUNTER — Other Ambulatory Visit (INDEPENDENT_AMBULATORY_CARE_PROVIDER_SITE_OTHER): Payer: Self-pay

## 2011-03-02 DIAGNOSIS — K754 Autoimmune hepatitis: Secondary | ICD-10-CM

## 2011-03-02 LAB — HEPATIC FUNCTION PANEL
AST: 46 U/L — ABNORMAL HIGH (ref 0–37)
Albumin: 3.5 g/dL (ref 3.5–5.2)
Alkaline Phosphatase: 52 U/L (ref 39–117)
Bilirubin, Direct: 0.1 mg/dL (ref 0.0–0.3)
Total Protein: 6.9 g/dL (ref 6.0–8.3)

## 2011-03-02 NOTE — Telephone Encounter (Signed)
Pt aware of results per Dr. Marina Goodell and will repeat labs in 6 mths.

## 2011-03-02 NOTE — Telephone Encounter (Signed)
Message copied by Chrystie Nose on Wed Mar 02, 2011  2:03 PM ------      Message from: Yancey Flemings      Created: Wed Mar 02, 2011  1:58 PM       Please let the patient know that his LFTs are stable and looked fine. Please repeat LFTs in 6 months

## 2011-03-04 NOTE — Assessment & Plan Note (Signed)
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  He was found to have severe multi-vessel coronary disease  after a positive stress test.  Notably, he had no angina or dyspnea,  despite the severe disease.  His ejection fraction remains normal.   Over the last year, Mr. Preslar has continued to do well.  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  He continues to walk 3 miles several days per week.  He has  not had any exertional dyspnea or chest discomfort.   He does complain of some numbness on the bottom of his feet.  There is  no radiation down his leg, and he has no worsening in the numbness when  he walks.  He has no calf discomfort when he walks.   CURRENT MEDICATIONS:  1. Aspirin 325 mg per day.  2. Niaspan 1,000 mg per day.  3. Multivitamin.  4. Vitamin D.  5. Lisinopril 20 mg per day.  6. Atenolol 25 mg per day.  7. Lipitor 10 mg on Sunday and 5 mg every other day.   PHYSICAL EXAMINATION:  GENERAL:  He is very well-appearing, in no  distress.  VITAL SIGNS:  heart rate 64, blood pressure 110/60 and equal  bilaterally.  Weight  is 197 pounds.  He has no jugular venous  distention, thyromegaly or lymphadenopathy.  LUNGS:  Clear to auscultation.  He has a nondisplaced point of maximal  cardiac impulse.  HEART:  Regular rate and rhythm without murmur, rub, gallop.  ABDOMEN:  Soft, nondistended, nontender.  There is no  hepatosplenomegaly.  Bowel sounds are normal.  There is a pulsatile mass  at the level of his umbilicus.  EXTREMITIES:   Warm without clubbing, cyanosis, edema, ulceration.  Hair  growth is preserved on both feet.  PT pulses are 2+ bilaterally.  DP  pulses are trace bilaterally.   IMPRESSION/RECOMMENDATIONS:  1. Atherosclerotic coronary disease:  It remains asymptomatic after      CABG.  EF preserved.  Make no changes in his medications.  Since he      is 7 years out from CABG and has a defective anginal warning      system, I recommended exercise testing with Cardiolite imaging.  2. Hypercholesterolemia.  Managed by Dr. Russo, goal LDL less than 70      and HDL greater than 40.  3. Abdominal aortic aneurysm:  4.1 x 4.3 cm as assessed in December      20 07.  Followup ultrasound in 6 months.  4. Asymptomatic cerebrovascular disease:  Follow-up ultrasound today.   I will plan on seeing him back  in a year's time.     Salvadore Farber, MD  Electronically Signed    WED/MedQ  DD: 11/08/2006  DT: 11/08/2006  Job #: [id number]   cc:   Gwen Pounds, MD

## 2011-03-10 ENCOUNTER — Encounter: Payer: Self-pay | Admitting: Internal Medicine

## 2011-03-16 ENCOUNTER — Ambulatory Visit
Admission: RE | Admit: 2011-03-16 | Discharge: 2011-03-16 | Disposition: A | Discharge status: Home / Self Care | Payer: Medicare Other | Source: Ambulatory Visit | Attending: Vascular Surgery | Admitting: Vascular Surgery

## 2011-03-16 ENCOUNTER — Ambulatory Visit (INDEPENDENT_AMBULATORY_CARE_PROVIDER_SITE_OTHER): Payer: Medicare Other | Admitting: Vascular Surgery

## 2011-03-16 DIAGNOSIS — I714 Abdominal aortic aneurysm, without rupture: Secondary | ICD-10-CM

## 2011-03-16 MED ORDER — IOHEXOL 300 MG/ML  SOLN
100.0000 mL | Freq: Once | INTRAMUSCULAR | Status: AC | PRN
  Administered 2011-03-16: 100 mL via INTRAVENOUS

## 2011-03-17 NOTE — Assessment & Plan Note (Signed)
OFFICE VISIT  RACHIT, GRIM DOB:  1936-01-15                                       03/16/2011 [id number]#:[id number]  I saw patient in the office today for follow-up after his endovascular aneurysm repair which was done in November 2011.  He comes in for a 6- month follow-up visit.  Since I saw him last in November of last year, he has had no abdominal or back pain.  He has been doing well.  He has a history of hypertension and hyperlipidemia, both of which are stable on his current medications.  SOCIAL HISTORY:  He is married.  He quit tobacco in 1972.  He is a retired IT sales professional.  REVIEW OF SYSTEMS:  CARDIOVASCULAR:  He has had no chest pain, chest pressure, palpitations or arrhythmias. PULMONARY:  He has had no productive cough, bronchitis, asthma, or wheezing.  PHYSICAL EXAMINATION:  This is a pleasant 75 year old gentleman who appears his stated age.  Blood pressure is 147/56, heart rate is 48, respiratory rate is 16.  Lungs are clear bilaterally to auscultation. Cardiovascular:  He has a regular rate and rhythm.  He has palpable femoral pulses and warm, well-perfused feet.  He has no significant lower extremity swelling.  His groin incisions have healed nicely. Musculoskeletal:  There are no major deformities or cyanosis.  I have reviewed his CT scan, which shows that his stent graft is in excellent position with no change in the size of his aneurysm and no evidence of endoleak.  Overall, I am pleased with his progress.  I will see him back in 6 months, and I have ordered a duplex scan for that time.  Assuming his aneurysm remains stable, we will then just follow him on an yearly basis with alternating ultrasound and duplex scan.    Di Kindle. Edilia Bo, M.D. Electronically Signed  CSD/MEDQ  D:  03/16/2011  T:  03/17/2011  Job:  [id number]  cc:   Rollene Rotunda, MD, [facility] Gwen Pounds, MD

## 2011-04-07 ENCOUNTER — Encounter: Payer: Self-pay | Admitting: Internal Medicine

## 2011-05-03 ENCOUNTER — Ambulatory Visit (AMBULATORY_SURGERY_CENTER): Payer: Medicare Other | Admitting: *Deleted

## 2011-05-03 ENCOUNTER — Encounter: Payer: Self-pay | Admitting: Internal Medicine

## 2011-05-03 VITALS — Ht 72.0 in | Wt 168.0 lb

## 2011-05-03 DIAGNOSIS — Z86010 Personal history of colonic polyps: Secondary | ICD-10-CM

## 2011-05-03 MED ORDER — PEG-KCL-NACL-NASULF-NA ASC-C 100 G PO SOLR: 1.0000 | Freq: Once | ORAL | Status: DC

## 2011-05-17 ENCOUNTER — Ambulatory Visit (AMBULATORY_SURGERY_CENTER): Payer: Medicare Other | Admitting: Internal Medicine

## 2011-05-17 ENCOUNTER — Encounter: Payer: Self-pay | Admitting: Internal Medicine

## 2011-05-17 VITALS — BP 142/49 | HR 42 | Temp 96.6°F | Resp 17 | Ht 71.0 in | Wt 162.0 lb

## 2011-05-17 DIAGNOSIS — K648 Other hemorrhoids: Secondary | ICD-10-CM

## 2011-05-17 DIAGNOSIS — D126 Benign neoplasm of colon, unspecified: Secondary | ICD-10-CM

## 2011-05-17 DIAGNOSIS — Z1211 Encounter for screening for malignant neoplasm of colon: Secondary | ICD-10-CM

## 2011-05-17 DIAGNOSIS — Z86010 Personal history of colonic polyps: Secondary | ICD-10-CM

## 2011-05-17 MED ORDER — SODIUM CHLORIDE 0.9 % IV SOLN: 500.0000 mL | INTRAVENOUS | Status: DC

## 2011-05-18 ENCOUNTER — Telehealth: Payer: Self-pay | Admitting: *Deleted

## 2011-05-18 NOTE — Telephone Encounter (Signed)
Follow up Call- Patient questions:  Do you have a fever, pain , or abdominal swelling? no Pain Score  0 *  Have you tolerated food without any problems? no  Have you been able to return to your normal activities? yes  Do you have any questions about your discharge instructions: Diet   no Medications  no Follow up visit  no  Do you have questions or concerns about your Care? no  Actions: * If pain score is 4 or above: No action needed, pain <4. Wife answered telephone.

## 2011-06-28 ENCOUNTER — Ambulatory Visit (INDEPENDENT_AMBULATORY_CARE_PROVIDER_SITE_OTHER): Payer: Medicare Other | Admitting: Internal Medicine

## 2011-06-28 ENCOUNTER — Ambulatory Visit: Payer: Medicare Other | Admitting: Internal Medicine

## 2011-06-28 ENCOUNTER — Other Ambulatory Visit (INDEPENDENT_AMBULATORY_CARE_PROVIDER_SITE_OTHER): Payer: Medicare Other

## 2011-06-28 ENCOUNTER — Encounter: Payer: Self-pay | Admitting: Internal Medicine

## 2011-06-28 VITALS — BP 124/60 | HR 64 | Ht 71.0 in | Wt 172.2 lb

## 2011-06-28 DIAGNOSIS — R197 Diarrhea, unspecified: Secondary | ICD-10-CM

## 2011-06-28 DIAGNOSIS — K754 Autoimmune hepatitis: Secondary | ICD-10-CM

## 2011-06-28 MED ORDER — METRONIDAZOLE 250 MG PO TABS: 250.0000 mg | ORAL_TABLET | Freq: Four times a day (QID) | ORAL | Status: AC

## 2011-06-28 NOTE — Patient Instructions (Signed)
Go to basement floor and have your labs drawn today. Metronidazole 250 mg sent to your pharmacy Follow-up in 1 year.

## 2011-06-28 NOTE — Progress Notes (Signed)
HISTORY OF PRESENT ILLNESS:  Cesar [name] is a 75 y.o. 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 He was last seen 05/17/2011 for surveillance colonoscopy. He was found to have a diminutive ascending colon adenoma which was removed. Followup in 5 years recommended. His last liver function test obtained 03/02/2011 were normal except for mild elevation of AST at 46 (0-37). This remained stable. He has been off prednisone for a year. Does have a new complaint, today, of change in bowel habits. Specifically, increased frequency of loose stools. He describes 2-3 loose bowels in a short period of time. Some urgency. No abdominal pain, bleeding, or weight loss. Some urgency. Minor episodes of incontinence. He denies new medications, recent antibiotic treatment, travel, or change in diet.  REVIEW OF SYSTEMS:  All non-GI ROS negative.  Past Medical History  Diagnosis Date  . Depression   . Heart murmur     corrected per pt  . Hyperlipidemia   . Hypertension   . Autoimmune hepatitis     Past Surgical History  Procedure Date  . Cervical discectomy   . Coronary artery bypass graft     x4  . Triple a indo vascular graft   . Colonoscopy   . Polypectomy   . Appendectomy     Social History Cesar [name]  reports that he has quit smoking. He has never used smokeless tobacco. He reports that he does not drink alcohol or use illicit drugs.  family history is negative for Colon cancer.  No Known Allergies     PHYSICAL EXAMINATION:Vital signs: BP 124/60  Pulse 64  Ht 5\' 11"  (1.803 m)  Wt 172 lb 3.2 oz (78.109 kg)  BMI 24.02 kg/m2  Constitutional: generally well-appearing, no acute distress Psychiatric: alert and oriented x3, cooperative Eyes: extraocular movements intact, anicteric, conjunctiva pink Mouth: oral pharynx moist, no lesions Neck: supple no  lymphadenopathy Cardiovascular: heart regular rate and rhythm, no murmur Lungs: clear to auscultation bilaterally Abdomen: soft, nontender, nondistended, no obvious ascites, no peritoneal signs, normal bowel sounds, no organomegaly Extremities: no lower extremity edema bilaterally Skin: no lesions on visible extremities Neuro: No focal deficits. No asterixis.    ASSESSMENT:  #1. History of autoimmune hepatitis(possibly drug-induced). Now off prednisone for one year with essentially normal liver tests. #2. History of adenomatous colon polyps. Surveillance up to date #3. Change in bowel habits with loose stools x  4-6 weeks   PLAN:  #1. Repeat liver function tests today. If stable, repeat in 6 months #2. Empiric treatment with metronidazole 250 mg 4 times a day x1 week #3. Okay to use Imodium when necessary #4. Routine GI followup in one year. Sooner if necessary. #5. Surveillance colonoscopy around July 2017

## 2011-06-29 ENCOUNTER — Other Ambulatory Visit: Payer: Self-pay | Admitting: Internal Medicine

## 2011-06-29 ENCOUNTER — Telehealth: Payer: Self-pay

## 2011-06-29 DIAGNOSIS — K760 Fatty (change of) liver, not elsewhere classified: Secondary | ICD-10-CM

## 2011-06-29 LAB — HEPATIC FUNCTION PANEL
ALT: 24 U/L (ref 0–53)
Total Bilirubin: 0.5 mg/dL (ref 0.3–1.2)
Total Protein: 7 g/dL (ref 6.0–8.3)

## 2011-06-29 NOTE — Telephone Encounter (Signed)
Message left for pt regarding results and repeating labs in .

## 2011-06-29 NOTE — Telephone Encounter (Signed)
Message copied by Michele Mcalpine on Wed Jun 29, 2011  1:22 PM ------      Message from: Hilarie Fredrickson      Created: Wed Jun 29, 2011 11:28 AM       Let pt know that his blood work looks fine. Repeat lfts in 6 months

## 2011-07-14 LAB — CARDIAC PANEL(CRET KIN+CKTOT+MB+TROPI)
Relative Index: 1.6
Total CK: 101
Total CK: 102

## 2011-07-14 LAB — BASIC METABOLIC PANEL
CO2: 26
Calcium: 9
Creatinine, Ser: 0.9
GFR calc Af Amer: >60

## 2011-07-14 LAB — POCT I-STAT, CHEM 8
BUN: 17
Calcium, Ion: 1.12
Creatinine, Ser: 1.1
Glucose, Bld: 104 — ABNORMAL HIGH
TCO2: 21

## 2011-07-14 LAB — PROTIME-INR
INR: 1
Prothrombin Time: 13.4

## 2011-07-14 LAB — LIPID PANEL
Cholesterol: 142
LDL Cholesterol: 81
Total CHOL/HDL Ratio: 3.4

## 2011-07-14 LAB — CBC
MCHC: 33.2
Platelets: 247
RBC: 3.91 — ABNORMAL LOW
WBC: 7

## 2011-07-14 LAB — CK TOTAL AND CKMB (NOT AT ARMC)
CK, MB: 2.1
Total CK: 117

## 2011-07-14 LAB — POCT CARDIAC MARKERS
CKMB, poc: 1.9
Myoglobin, poc: 84.2
Operator id: 294511

## 2011-07-14 LAB — TROPONIN I: Troponin I: 0.01

## 2011-07-15 LAB — DIFFERENTIAL
Basophils Absolute: 0.1
Basophils Relative: 1
Eosinophils Absolute: 0.5
Eosinophils Relative: 6 — ABNORMAL HIGH
Monocytes Absolute: 1

## 2011-07-15 LAB — CBC
HCT: 40
Hemoglobin: 13.2
MCHC: 32.9
MCV: 95.4
Platelets: 269
RDW: 14.1

## 2011-07-15 LAB — PROTIME-INR: Prothrombin Time: 13.5

## 2011-07-15 LAB — BASIC METABOLIC PANEL
BUN: 21
CO2: 26
Chloride: 104
GFR calc non Af Amer: >60
Glucose, Bld: 122 — ABNORMAL HIGH
Potassium: 5.3 — ABNORMAL HIGH

## 2011-07-19 ENCOUNTER — Ambulatory Visit: Payer: Medicare Other | Admitting: Cardiology

## 2011-08-03 ENCOUNTER — Encounter: Payer: Self-pay | Admitting: Cardiology

## 2011-08-04 ENCOUNTER — Encounter: Payer: Self-pay | Admitting: Cardiology

## 2011-08-04 ENCOUNTER — Ambulatory Visit (INDEPENDENT_AMBULATORY_CARE_PROVIDER_SITE_OTHER): Payer: Medicare Other | Admitting: Cardiology

## 2011-08-04 DIAGNOSIS — I714 Abdominal aortic aneurysm, without rupture: Secondary | ICD-10-CM

## 2011-08-04 DIAGNOSIS — E785 Hyperlipidemia, unspecified: Secondary | ICD-10-CM

## 2011-08-04 DIAGNOSIS — I6529 Occlusion and stenosis of unspecified carotid artery: Secondary | ICD-10-CM

## 2011-08-04 DIAGNOSIS — I2510 Atherosclerotic heart disease of native coronary artery without angina pectoris: Secondary | ICD-10-CM

## 2011-08-04 DIAGNOSIS — IMO0002 Reserved for concepts with insufficient information to code with codable children: Secondary | ICD-10-CM

## 2011-08-04 NOTE — Assessment & Plan Note (Signed)
The blood pressure is at target. No change in medications is indicated. We will continue with therapeutic lifestyle changes (TLC).  

## 2011-08-04 NOTE — Assessment & Plan Note (Signed)
The patient has no new sypmtoms.  No further cardiovascular testing is indicated.  We will continue with aggressive risk reduction and meds as listed.  I do plan a treadmill next year as a routine.

## 2011-08-04 NOTE — Assessment & Plan Note (Signed)
By his report his last LDL was less than 100 and HDL greater than 50.  He will continue with current therapy.

## 2011-08-04 NOTE — Progress Notes (Signed)
HPI The patient presents for follow up of CAD.  Since I last saw him he has done well.  He is active and walks and does yard work.  The patient denies any new symptoms such as chest discomfort, neck or arm discomfort. There has been no new shortness of breath, PND or orthopnea. There have been no reported palpitations, presyncope or syncope.  He had an endovascular stent of his AAA last year.   No Known Allergies  Current Outpatient Prescriptions  Medication Sig Dispense Refill  . aspirin 81 MG tablet Take 81 mg by mouth daily.        Marland Kitchen atenolol (TENORMIN) 25 MG tablet Take 25 mg by mouth daily.        . fenofibrate 160 MG tablet Take 160 mg by mouth daily.        . nitroGLYCERIN (NITROSTAT) 0.4 MG SL tablet Place 0.4 mg under the tongue every 5 (five) minutes as needed.        . sertraline (ZOLOFT) 50 MG tablet Take 50 mg by mouth daily.          Past Medical History  Diagnosis Date  . Depression   . Heart murmur     corrected per pt  . Hyperlipidemia   . Hypertension   . Autoimmune hepatitis   . Coronary atherosclerosis of unspecified type of vessel, native or graft   . Chest pain, unspecified   . Peripheral vascular disease, unspecified   . Hypotension, unspecified   . Occlusion and stenosis of carotid artery without mention of cerebral infarction   . Nonspecific elevation of levels of transaminase or lactic acid dehydrogenase (LDH)   . Unspecified hemorrhoids without mention of complication   . Other chronic nonalcoholic liver disease   . Abdominal aneurysm without mention of rupture   . Osteoarthrosis, unspecified whether generalized or localized, unspecified site   . Other and unspecified hyperlipidemia     Past Surgical History  Procedure Date  . Cervical discectomy   . Coronary artery bypass graft     x4  . Triple a indo vascular graft   . Colonoscopy   . Polypectomy   . Appendectomy   . Tonsillectomy   . Skin cancer excision     ROS:  As stated in the HPI and  negative for all other systems.  PHYSICAL EXAM BP 118/59  Pulse 51  Ht 5\' 10"  (1.778 m)  Wt 172 lb (78.019 kg)  BMI 24.68 kg/m2 GENERAL:  Well appearing HEENT:  Pupils equal round and reactive, fundi not visualized, oral mucosa unremarkable NECK:  No jugular venous distention, waveform within normal limits, carotid upstroke brisk and symmetric, bilateral bruits, no thyromegaly LYMPHATICS:  No cervical, inguinal adenopathy LUNGS:  Clear to auscultation bilaterally BACK:  No CVA tenderness CHEST: Well healed sternotomy scar. HEART:  PMI not displaced or sustained,S1 and S2 within normal limits, no S3, no S4, no clicks, no rubs, no murmurs ABD:  Flat, positive bowel sounds normal in frequency in pitch, no bruits, no rebound, no guarding, no midline pulsatile mass, midline bruit, no hepatomegaly, no splenomegaly EXT:  2 plus pulses throughout, no edema, no cyanosis no clubbing SKIN:  No rashes no nodules NEURO:  Cranial nerves II through XII grossly intact, motor grossly intact throughout PSYCH:  Cognitively intact, oriented to person place and time  EKG:  Sinus rhythm, rate 51 , axis within normal limits, intervals within normal limits, no acute ST-T wave changes.   ASSESSMENT AND PLAN

## 2011-08-04 NOTE — Patient Instructions (Signed)
Your physician has requested that you have a carotid duplex in May of 2013. This test is an ultrasound of the carotid arteries in your neck. It looks at blood flow through these arteries that supply the brain with blood. Allow one hour for this exam. There are no restrictions or special instructions.  Follow up in 1 year with Dr Antoine Poche.  You will receive a letter in the mail 2 months before you are due.  Please call us when you receive this letter to schedule your follow up appointment.  The current medical regimen is effective;  continue present plan and medications.

## 2011-08-04 NOTE — Assessment & Plan Note (Signed)
He will have a carotid Doppler in May as it has been two years.  He had 40 - 59% right and 0 - 39% left stenosis.

## 2011-09-05 ENCOUNTER — Other Ambulatory Visit (INDEPENDENT_AMBULATORY_CARE_PROVIDER_SITE_OTHER): Payer: BC Managed Care – PPO

## 2011-09-05 DIAGNOSIS — R7989 Other specified abnormal findings of blood chemistry: Secondary | ICD-10-CM

## 2011-09-05 LAB — HEPATIC FUNCTION PANEL
ALT: 24 U/L (ref 0–53)
AST: 40 U/L — ABNORMAL HIGH (ref 0–37)
Total Protein: 7.1 g/dL (ref 6.0–8.3)

## 2011-09-06 ENCOUNTER — Telehealth: Payer: Self-pay

## 2011-09-06 NOTE — Telephone Encounter (Signed)
Pt aware.

## 2011-09-06 NOTE — Telephone Encounter (Signed)
Message copied by Michele Mcalpine on Tue Sep 06, 2011 10:01 AM ------      Message from: Hilarie Fredrickson      Created: Tue Sep 06, 2011  9:20 AM       Let pt know lfts look good. Repeat in 6 months

## 2011-09-13 ENCOUNTER — Encounter: Payer: Self-pay | Admitting: Vascular Surgery

## 2011-09-20 ENCOUNTER — Encounter: Payer: Self-pay | Admitting: Vascular Surgery

## 2011-09-21 ENCOUNTER — Encounter: Payer: Self-pay | Admitting: Vascular Surgery

## 2011-09-21 ENCOUNTER — Ambulatory Visit (INDEPENDENT_AMBULATORY_CARE_PROVIDER_SITE_OTHER): Payer: BC Managed Care – PPO | Admitting: Vascular Surgery

## 2011-09-21 ENCOUNTER — Ambulatory Visit (INDEPENDENT_AMBULATORY_CARE_PROVIDER_SITE_OTHER): Payer: BC Managed Care – PPO | Admitting: *Deleted

## 2011-09-21 VITALS — BP 157/74 | HR 51 | Resp 16 | Ht 70.0 in | Wt 172.0 lb

## 2011-09-21 DIAGNOSIS — I6529 Occlusion and stenosis of unspecified carotid artery: Secondary | ICD-10-CM

## 2011-09-21 DIAGNOSIS — I714 Abdominal aortic aneurysm, without rupture, unspecified: Secondary | ICD-10-CM

## 2011-09-21 DIAGNOSIS — Z48812 Encounter for surgical aftercare following surgery on the circulatory system: Secondary | ICD-10-CM

## 2011-09-21 NOTE — Progress Notes (Signed)
[facility] [place]  Patient name: Cesar [name] MRN: [id number] DOB: September 14, 1936 Sex: male  CC: Follow up of the EVAR  HPI: Cesar [name] is a 75 y.o. male who underwent EVAR on 08/31/2010. He was last seen in our office on 03/16/2011. CT scan at that time showed that the graft was in excellent position with no change in the size of the aneurysm and no evidence of endoleak. He returns for his 6 month follow up visit with an ultrasound. He has had no abdominal pain or back pain. He does have a history of hypertension and hyperlipidemia both of which are stable on his current medications.   Past Medical History  Diagnosis Date  . Depression   . Heart murmur     corrected per pt  . Hyperlipidemia   . Hypertension   . Autoimmune hepatitis   . Coronary atherosclerosis of unspecified type of vessel, native or graft   . Peripheral vascular disease, unspecified   . Occlusion and stenosis of carotid artery without mention of cerebral infarction   . Nonspecific elevation of levels of transaminase or lactic acid dehydrogenase (LDH)   . Unspecified hemorrhoids without mention of complication   . Other chronic nonalcoholic liver disease   . Osteoarthrosis, unspecified whether generalized or localized, unspecified site   . Other and unspecified hyperlipidemia   . AAA (abdominal aortic aneurysm)     Family History  Problem Relation Age of Onset  . Colon cancer Neg Hx   . Colon polyps Father   . Heart disease Mother     SOCIAL HISTORY: History  Substance Use Topics  . Smoking status: Former Smoker    Quit date: 10/17/1968  . Smokeless tobacco: Never Used   Comment: quit 1970  . Alcohol Use: No    No Known Allergies  Current Outpatient Prescriptions  Medication Sig Dispense Refill  . aspirin 81 MG tablet Take 81 mg by mouth daily.        Marland Kitchen atenolol (TENORMIN) 25 MG tablet Take 25 mg by mouth daily.        . fenofibrate 160 MG tablet Take 160 mg by  mouth daily.        . nitroGLYCERIN (NITROSTAT) 0.4 MG SL tablet Place 0.4 mg under the tongue every 5 (five) minutes as needed.        . sertraline (ZOLOFT) 50 MG tablet Take 50 mg by mouth daily.          REVIEW OF SYSTEMS: Arly.Keller ] denotes positive finding; [  ] denotes negative finding CARDIOVASCULAR:  [ ]  chest pain   [ ]  chest pressure   [ ]  palpitations   [ ]  orthopnea   [ ]  dyspnea on exertion   [ ]  claudication   [ ]  rest pain   [ ]  DVT   [ ]  phlebitis PULMONARY:   [ ]  productive cough   [ ]  asthma   [ ]  wheezing NEUROLOGIC:   [ ]  weakness  [ ]  paresthesias  [ ]  aphasia  [ ]  amaurosis  [ ]  dizziness HEMATOLOGIC:   [ ]  bleeding problems   [ ]  clotting disorders MUSCULOSKELETAL:  [ ]  joint pain   [ ]  joint swelling [ ]  leg swelling GASTROINTESTINAL: [ ]   blood in stool  [ ]   hematemesis GENITOURINARY:  [ ]   dysuria  [ ]   hematuria PSYCHIATRIC:  [ ]  history of major depression INTEGUMENTARY:  [ ]  rashes  [ ]  ulcers  CONSTITUTIONAL:  [ ]  fever   [ ]  chills  PHYSICAL EXAM: Filed Vitals:   09/21/11 1027  BP: 157/74  Pulse: 51  Resp: 16  Height: 5\' 10"  (1.778 m)  Weight: 172 lb (78.019 kg)  SpO2: 99%   Body mass index is 24.68 kg/(m^2). GENERAL: The patient is a well-nourished male, in no acute distress. The vital signs are documented above. CARDIOVASCULAR: There is a regular rate and rhythm without significant murmur appreciated. He has a soft right carotid bruit. He has palpable femoral pulses and warm well-perfused feet. He has no significant lower Shri swelling. PULMONARY: There is good air exchange bilaterally without wheezing or rales. ABDOMEN: Soft and non-tender with normal pitched bowel sounds.  MUSCULOSKELETAL: There are no major deformities or cyanosis. NEUROLOGIC: No focal weakness or paresthesias are detected. SKIN: There are no ulcers or rashes noted. PSYCHIATRIC: The patient has a normal affect.  DATA:  I have independently interpreted his duplex scan of his  EVAR.  It appears that the diameter of the aneurysm has decreased in size compared to the CT scan previously.  Of note I did review his records from [facility] cardiology. His last carotid duplex was in 2009. This showed a 40-59% right carotid stenosis with a less than 39% left carotid stenosis.  MEDICAL ISSUES: 1. With respect to his EVAR, the aneurysm continues to be stable in size without evidence of endoleak. I've ordered a CT scan in 6 months to continue to follow this. If the CT scan at that time looks good we could potentially stretch his follow up out to one year. 2. With respect to his right carotid bruit, he does have a moderate 40-59% right carotid stenosis based on her previous duplex scan done at the [place] office. He is due to see Dr. Antoine Poche in the near future and his wife will call to see if he can have a duplex scan of his carotids at that time.  3. I see him back in 6 months. He knows to call sooner if he has problems.  Jalayah Gutridge S [facility] [place] Office: [phone]

## 2011-09-22 ENCOUNTER — Encounter: Payer: Self-pay | Admitting: Internal Medicine

## 2011-09-22 ENCOUNTER — Ambulatory Visit (INDEPENDENT_AMBULATORY_CARE_PROVIDER_SITE_OTHER): Payer: BC Managed Care – PPO | Admitting: Internal Medicine

## 2011-09-22 VITALS — BP 118/58 | HR 52 | Ht 71.0 in | Wt 176.6 lb

## 2011-09-22 DIAGNOSIS — R197 Diarrhea, unspecified: Secondary | ICD-10-CM

## 2011-09-22 DIAGNOSIS — K754 Autoimmune hepatitis: Secondary | ICD-10-CM

## 2011-09-22 DIAGNOSIS — R198 Other specified symptoms and signs involving the digestive system and abdomen: Secondary | ICD-10-CM

## 2011-09-22 DIAGNOSIS — Z86010 Personal history of colonic polyps: Secondary | ICD-10-CM

## 2011-09-22 MED ORDER — METRONIDAZOLE 250 MG PO TABS: 250.0000 mg | ORAL_TABLET | Freq: Four times a day (QID) | ORAL | Status: AC

## 2011-09-22 MED ORDER — ALIGN PO CAPS: 1.0000 | ORAL_CAPSULE | Freq: Every day | ORAL | Status: AC

## 2011-09-22 MED ORDER — LOPERAMIDE HCL 2 MG PO TABS: 2.0000 mg | ORAL_TABLET | Freq: Four times a day (QID) | ORAL | Status: AC | PRN

## 2011-09-22 NOTE — Progress Notes (Signed)
HISTORY OF PRESENT ILLNESS:  Cesar [name] is a 75 y.o. 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 His last colonoscopy was 05/17/2011. His liver tests have been stable. He was seen 06/28/2011 for new onset change in bowel habits as manifested by increased frequency of stools with urgency. He was treated with metronidazole 250 mg 4 times a day x1 week. He states it is significantly improved his bowel habits. However, in recent weeks he has had recurrent symptoms. He is accompanied today by his wife. He describes postprandial urgency. Some days his bowels are regular. His weight has been stable. No new medications. Some incontinent episodes.  REVIEW OF SYSTEMS:  All non-GI ROS negative except for sinus and allergy trouble  Past Medical History  Diagnosis Date  . Depression   . Heart murmur     corrected per pt  . Hyperlipidemia   . Hypertension   . Autoimmune hepatitis   . Coronary atherosclerosis of unspecified type of vessel, native or graft   . Peripheral vascular disease, unspecified   . Occlusion and stenosis of carotid artery without mention of cerebral infarction   . Nonspecific elevation of levels of transaminase or lactic acid dehydrogenase (LDH)   . Unspecified hemorrhoids without mention of complication   . Other chronic nonalcoholic liver disease   . Osteoarthrosis, unspecified whether generalized or localized, unspecified site   . Other and unspecified hyperlipidemia   . AAA (abdominal aortic aneurysm)     Past Surgical History  Procedure Date  . Cervical discectomy   . Triple a indo vascular graft   . Colonoscopy   . Polypectomy   . Appendectomy   . Tonsillectomy   . Skin cancer excision   . Abdominal aortic aneurysm repair   . Coronary artery bypass graft 2001    x4    Social History Cesar [name]  reports that he quit smoking about 42 years  ago. He has never used smokeless tobacco. He reports that he does not drink alcohol or use illicit drugs.  family history includes Colon polyps in his father and Heart disease in his mother.  There is no history of Colon cancer.  No Known Allergies     PHYSICAL EXAMINATION: Vital signs: BP 118/58  Pulse 52  Ht 5\' 11"  (1.803 m)  Wt 176 lb 9.6 oz (80.105 kg)  BMI 24.63 kg/m2 General: Well-developed, well-nourished, no acute distress HEENT: Sclerae are anicteric, conjunctiva pink. Oral mucosa intact Lungs: Clear Heart: Regular Abdomen: soft, nontender, nondistended, no obvious ascites, no peritoneal signs, normal bowel sounds. No organomegaly. Extremities: No edema Psychiatric: alert and oriented x3. Cooperative    ASSESSMENT:  #1. Change in bowel habits with urgency and loose stools intermittently. Sounds like postinfectious irritable bowel syndrome. Seemingly had a response to metronidazole, query occult infectious process #2. 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 Stable off prednisone #3. History of adenomatous colon polyps. Last colonoscopy July 2012   PLAN:  #1. Repeat trial of metronidazole 250 mg by mouth 4 times a day x2 weeks #2. Use Imodium when necessary. Had not been doing previously #3. Probiotic align one daily for 2 weeks #4. Routine office followup in 6 weeks

## 2011-09-22 NOTE — Patient Instructions (Addendum)
We have sent the following medications to your pharmacy for you to pick up at your convenience:  Metronidazole  We have given you samples of Align to take 1 a day for 2 weeks.  You make take Imodium over the counter as needed  Please follow up in 6 weeks

## 2011-09-28 NOTE — Procedures (Unsigned)
VASCULAR LAB EXAM  INDICATION:  Follow up AAA endograft placed 08/31/2010.  HISTORY: Diabetes:  No. Cardiac:  Yes. Hypertension:  Yes.  EXAM:  AAA sac size 4.2 cm AP, 5.5 cm transverse.  Previous sac size 09/15/10 by CT:  5.0 cm AP, 5.5 cm transverse.  IMPRESSION: 1. Technically difficult study due to irregularly shaped aneurysmal     sac. 2. The aorta and endograft appear patent. 3. No significant change in the size of the aneurysmal sac surrounding     the endograft. 4. No evidence of endoleak was detected.  ___________________________________________ Di Kindle. Edilia Bo, M.D.  LT/MEDQ  D:  09/21/2011  T:  09/21/2011  Job:  [id number]

## 2011-09-30 ENCOUNTER — Other Ambulatory Visit: Payer: Self-pay | Admitting: Vascular Surgery

## 2011-11-04 ENCOUNTER — Ambulatory Visit (INDEPENDENT_AMBULATORY_CARE_PROVIDER_SITE_OTHER): Payer: BC Managed Care – PPO | Admitting: Internal Medicine

## 2011-11-04 ENCOUNTER — Encounter: Payer: Self-pay | Admitting: Internal Medicine

## 2011-11-04 VITALS — BP 142/78 | HR 72 | Ht 70.0 in | Wt 177.0 lb

## 2011-11-04 DIAGNOSIS — R197 Diarrhea, unspecified: Secondary | ICD-10-CM

## 2011-11-04 DIAGNOSIS — Z86010 Personal history of colonic polyps: Secondary | ICD-10-CM

## 2011-11-04 DIAGNOSIS — K754 Autoimmune hepatitis: Secondary | ICD-10-CM

## 2011-11-04 NOTE — Progress Notes (Signed)
HISTORY OF PRESENT ILLNESS:  Cesar [name] is a 76 y.o. 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 He presents today for followup regarding diarrhea. He was last seen in the office 09/22/2011. See that dictation. At that time, he was felt to have had either an infectious process or post infectious IBS type picture. He was treated with 2 weeks of metronidazole and a probiotic. Within one week his symptoms resolve. He completed antibiotic therapy. He continues to take probiotic. He has not required Imodium. No new problems her issues.  REVIEW OF SYSTEMS:  All non-GI ROS negative.  Past Medical History  Diagnosis Date  . Depression   . Heart murmur     corrected per pt  . Hyperlipidemia   . Hypertension   . Autoimmune hepatitis   . Coronary atherosclerosis of unspecified type of vessel, native or graft   . Peripheral vascular disease, unspecified   . Occlusion and stenosis of carotid artery without mention of cerebral infarction   . Nonspecific elevation of levels of transaminase or lactic acid dehydrogenase (LDH)   . Unspecified hemorrhoids without mention of complication   . Other chronic nonalcoholic liver disease   . Osteoarthrosis, unspecified whether generalized or localized, unspecified site   . Other and unspecified hyperlipidemia   . AAA (abdominal aortic aneurysm)     Past Surgical History  Procedure Date  . Cervical discectomy   . Triple a indo vascular graft   . Polypectomy   . Appendectomy   . Tonsillectomy   . Skin cancer excision   . Abdominal aortic aneurysm repair   . Coronary artery bypass graft 2001    x4    Social History Cesar [name]  reports that he quit smoking about 43 years ago. He has never used smokeless tobacco. He reports that he does not drink alcohol or use illicit drugs.  family history includes Colon polyps in his father and  Heart disease in his mother.  There is no history of Colon cancer.  No Known Allergies     PHYSICAL EXAMINATION: Vital signs: BP 142/78  Pulse 72  Ht 5\' 10"  (1.778 m)  Wt 177 lb (80.287 kg)  BMI 25.40 kg/m2 General: Well-developed, well-nourished, no acute distress HEENT: Sclerae are anicteric, conjunctiva pink. Oral mucosa intact Lungs: Clear Heart: Regular Abdomen: soft, nontender, nondistended, no obvious ascites, no peritoneal signs, normal bowel sounds. No organomegaly. Extremities: No edema Psychiatric: alert and oriented x3. Cooperative      ASSESSMENT:  #1. Recent problems with urgency and loose stools of 3 months duration resolve after her second course of metronidazole and probiotic. #2. 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 Stable off prednisone. Continue to monitor periodically #3. History of adenomatous colon polyps. Last colonoscopy July 2012.   PLAN:  #1. Discontinue probiotic #2. Continue periodic LFTs as previously outlined #3. Surveillance colonoscopy around July 2017 #4. Interval GI followup as needed

## 2011-11-04 NOTE — Patient Instructions (Signed)
Please follow up as needed 

## 2012-02-16 ENCOUNTER — Other Ambulatory Visit: Payer: Self-pay | Admitting: Internal Medicine

## 2012-02-16 ENCOUNTER — Other Ambulatory Visit (INDEPENDENT_AMBULATORY_CARE_PROVIDER_SITE_OTHER): Payer: BC Managed Care – PPO

## 2012-02-16 DIAGNOSIS — K7689 Other specified diseases of liver: Secondary | ICD-10-CM

## 2012-02-16 DIAGNOSIS — R7989 Other specified abnormal findings of blood chemistry: Secondary | ICD-10-CM

## 2012-02-16 DIAGNOSIS — K760 Fatty (change of) liver, not elsewhere classified: Secondary | ICD-10-CM

## 2012-02-16 LAB — HEPATIC FUNCTION PANEL
ALT: 21 U/L (ref 0–53)
Bilirubin, Direct: 0.1 mg/dL (ref 0.0–0.3)
Total Bilirubin: 0.2 mg/dL — ABNORMAL LOW (ref 0.3–1.2)

## 2012-03-06 ENCOUNTER — Other Ambulatory Visit: Payer: Self-pay | Admitting: Internal Medicine

## 2012-03-06 DIAGNOSIS — I6529 Occlusion and stenosis of unspecified carotid artery: Secondary | ICD-10-CM

## 2012-03-08 ENCOUNTER — Ambulatory Visit
Admission: RE | Admit: 2012-03-08 | Discharge: 2012-03-08 | Disposition: A | Discharge status: Home / Self Care | Payer: BC Managed Care – PPO | Source: Ambulatory Visit | Attending: Internal Medicine | Admitting: Internal Medicine

## 2012-03-08 DIAGNOSIS — I6529 Occlusion and stenosis of unspecified carotid artery: Secondary | ICD-10-CM

## 2012-03-23 ENCOUNTER — Telehealth: Payer: Self-pay | Admitting: Cardiology

## 2012-03-23 NOTE — Telephone Encounter (Signed)
Results routed to Dr Antoine Poche for review.  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 (pt was painting)

## 2012-03-23 NOTE — Telephone Encounter (Signed)
Pt calling to see if we got results of carotid from dr Timothy Lasso?

## 2012-03-27 ENCOUNTER — Encounter: Payer: Self-pay | Admitting: Vascular Surgery

## 2012-03-28 ENCOUNTER — Ambulatory Visit
Admission: RE | Admit: 2012-03-28 | Discharge: 2012-03-28 | Disposition: A | Discharge status: Home / Self Care | Payer: BC Managed Care – PPO | Source: Ambulatory Visit | Attending: Vascular Surgery | Admitting: Vascular Surgery

## 2012-03-28 ENCOUNTER — Ambulatory Visit (INDEPENDENT_AMBULATORY_CARE_PROVIDER_SITE_OTHER): Payer: BC Managed Care – PPO | Admitting: Vascular Surgery

## 2012-03-28 ENCOUNTER — Encounter: Payer: Self-pay | Admitting: Vascular Surgery

## 2012-03-28 VITALS — BP 147/62 | HR 48 | Temp 98.0°F | Ht 70.0 in | Wt 173.0 lb

## 2012-03-28 DIAGNOSIS — I714 Abdominal aortic aneurysm, without rupture: Secondary | ICD-10-CM

## 2012-03-28 MED ORDER — IOHEXOL 350 MG/ML SOLN
100.0000 mL | Freq: Once | INTRAVENOUS | Status: AC | PRN
  Administered 2012-03-28: 100 mL via INTRAVENOUS

## 2012-03-28 NOTE — Progress Notes (Signed)
[facility] [place]  Patient name: Cesar [name] MRN: [id number] DOB: 1936/09/02 Sex: male  REASON FOR VISIT: follow up after EVAR  HPI: Cesar [name] is a 76 y.o. male who underwent endovascular aneurysm repair of 5.3 cm infrarenal abdominal aortic aneurysm on 08/31/2010. He comes in for a routine follow up visit. Since I saw him last he said no significant abdominal pain or back pain. He's been doing well overall has been fairly active.   REVIEW OF SYSTEMS: Arly.Keller ] denotes positive finding; [  ] denotes negative finding  CARDIOVASCULAR:  [ ]  chest pain   [ ]  dyspnea on exertion    CONSTITUTIONAL:  [ ]  fever   [ ]  chills  PHYSICAL EXAM: Filed Vitals:   03/28/12 1320  BP: 147/62  Pulse: 48  Temp: 98 F (36.7 C)  TempSrc: Oral  Height: 5\' 10"  (1.778 m)  Weight: 173 lb (78.472 kg)  SpO2: 98%   Body mass index is 24.82 kg/(m^2). GENERAL: The patient is a well-nourished male, in no acute distress. The vital signs are documented above. CARDIOVASCULAR: There is a regular rate and rhythm. He has palpable femoral pulses bilaterally. Both feet are warm and well-perfused.  PULMONARY: There is good air exchange bilaterally without wheezing or rales. Abdomen: Soft and nontender.  I have reviewed his CT scan. This shows that his aneurysm has decreased in size to 4.3 cm. The stent graft is in good position with no evidence of endoleak.  MEDICAL ISSUES:  AAA This patient is status post EVAR in November of 2011. The stent graft is in excellent position and the aneurysm is decreased in size. This reason I think one year follow up visits are fine. I've ordered a fall ultrasound in one year and I'll see him back at that time. He knows to call sooner if he has problems.   Drae Mitzel S [facility] [place] Beeper: [phone]

## 2012-03-28 NOTE — Assessment & Plan Note (Signed)
This patient is status post EVAR in November of 2011. The stent graft is in excellent position and the aneurysm is decreased in size. This reason I think one year follow up visits are fine. I've ordered a fall ultrasound in one year and I'll see him back at that time. He knows to call sooner if he has problems.

## 2012-08-06 ENCOUNTER — Ambulatory Visit (INDEPENDENT_AMBULATORY_CARE_PROVIDER_SITE_OTHER): Payer: BC Managed Care – PPO | Admitting: Cardiology

## 2012-08-06 ENCOUNTER — Encounter: Payer: Self-pay | Admitting: Cardiology

## 2012-08-06 VITALS — BP 124/73 | HR 60 | Ht 70.0 in | Wt 175.8 lb

## 2012-08-06 DIAGNOSIS — R079 Chest pain, unspecified: Secondary | ICD-10-CM

## 2012-08-06 DIAGNOSIS — I6529 Occlusion and stenosis of unspecified carotid artery: Secondary | ICD-10-CM

## 2012-08-06 DIAGNOSIS — I714 Abdominal aortic aneurysm, without rupture: Secondary | ICD-10-CM

## 2012-08-06 DIAGNOSIS — I2510 Atherosclerotic heart disease of native coronary artery without angina pectoris: Secondary | ICD-10-CM

## 2012-08-06 DIAGNOSIS — IMO0002 Reserved for concepts with insufficient information to code with codable children: Secondary | ICD-10-CM

## 2012-08-06 NOTE — Patient Instructions (Addendum)
Your physician wants you to follow-up in: 1 year with Dr. Hochrein.  You will receive a reminder letter in the mail two months in advance. If you don't receive a letter, please call our office to schedule the follow-up appointment.  

## 2012-08-06 NOTE — Progress Notes (Addendum)
HPI The patient presents for follow up of CAD.  Since I last saw him he has done well.  He is active and walks a mile or two everyday.   The patient denies any new symptoms such as chest discomfort, neck or arm discomfort. There has been no new shortness of breath, PND or orthopnea. There have been no reported palpitations, presyncope or syncope.     No Known Allergies  Current Outpatient Prescriptions  Medication Sig Dispense Refill  . aspirin 81 MG tablet Take 81 mg by mouth daily.        Marland Kitchen atenolol (TENORMIN) 25 MG tablet Take 25 mg by mouth daily.        . bifidobacterium infantis (ALIGN) capsule Take 1 capsule by mouth daily.  14 capsule  0  . fenofibrate 160 MG tablet Take 160 mg by mouth daily.        . Multiple Vitamin (MULTIVITAMIN) tablet Take 1 tablet by mouth daily.      . sertraline (ZOLOFT) 50 MG tablet Take 50 mg by mouth daily.          Past Medical History  Diagnosis Date  . Depression   . Heart murmur     corrected per pt  . Hyperlipidemia   . Hypertension   . Autoimmune hepatitis   . Coronary atherosclerosis of unspecified type of vessel, native or graft   . Peripheral vascular disease, unspecified   . Occlusion and stenosis of carotid artery without mention of cerebral infarction   . Nonspecific elevation of levels of transaminase or lactic acid dehydrogenase (LDH)   . Unspecified hemorrhoids without mention of complication   . Other chronic nonalcoholic liver disease   . Osteoarthrosis, unspecified whether generalized or localized, unspecified site   . Other and unspecified hyperlipidemia   . AAA (abdominal aortic aneurysm)     Past Surgical History  Procedure Date  . Cervical discectomy   . Triple a indo vascular graft   . Polypectomy   . Appendectomy   . Tonsillectomy   . Skin cancer excision   . Abdominal aortic aneurysm repair   . Coronary artery bypass graft 2001    x4    ROS:  Recent cold like symptoms.  As stated in the HPI and negative  for all other systems.  PHYSICAL EXAM BP 124/73  Pulse 60  Ht 5\' 10"  (1.778 m)  Wt 175 lb 12.8 oz (79.742 kg)  BMI 25.22 kg/m2 GENERAL:  Well appearing NECK:  No jugular venous distention, waveform within normal limits, carotid upstroke brisk and symmetric, bilateral bruits, no thyromegaly LYMPHATICS:  No cervical, inguinal adenopathy LUNGS:  Clear to auscultation bilaterally BACK:  No CVA tenderness CHEST: Well healed sternotomy scar. HEART:  PMI not displaced or sustained,S1 and S2 within normal limits, no S3, no S4, no clicks, no rubs, no murmurs ABD:  Flat, positive bowel sounds normal in frequency in pitch, positive abdominal bruits, no rebound, no guarding, no midline pulsatile mass, midline bruit, no hepatomegaly, no splenomegaly EXT:  2 plus pulses throughout, no edema, no cyanosis no clubbing SKIN:  No rashes no nodules   EKG:  Sinus rhythm, rate 60, axis within normal limits, intervals within normal limits, no acute ST-T wave changes.  08/06/2012   ASSESSMENT AND PLAN  CAD -  The patient has no new symptoms since a stress perfusion study on 2011. No further cardiovascular testing is indicated. We will continue with aggressive risk reduction and meds as listed.  I  will likely schedule a routine ETT next year.   CAROTID STENOSIS -  I reviewed a recent Doppler done this year.  I have suggested that he have this followed by Dr. Edilia Bo who will also be following him s/p AAA stenting.  I reviewed the notes from Dr. Edilia Bo.   HYPERTENSION NEC -  The blood pressure is at target. No change in medications is indicated. We will continue with therapeutic lifestyle changes (TLC).;   DYSLIPIDEMIA -  He does not recall his last lipid.  I would suggest a goal LDL less than 100 and HDL greater than 40.  He is not on a statin.  The Heart Protection Study would suggest some benefit in most patients with vascular disease unless there is a contraindication  He may have had muscle aches with  multiple statins.  He also has had some mild liver enzyme elevations.  I will defer further decisions about statins to Gwen Pounds, MD

## 2012-08-31 ENCOUNTER — Other Ambulatory Visit: Payer: Self-pay | Admitting: Internal Medicine

## 2012-08-31 ENCOUNTER — Other Ambulatory Visit (INDEPENDENT_AMBULATORY_CARE_PROVIDER_SITE_OTHER): Payer: BC Managed Care – PPO

## 2012-08-31 DIAGNOSIS — R7989 Other specified abnormal findings of blood chemistry: Secondary | ICD-10-CM

## 2012-08-31 DIAGNOSIS — K754 Autoimmune hepatitis: Secondary | ICD-10-CM

## 2012-08-31 LAB — HEPATIC FUNCTION PANEL
ALT: 24 U/L (ref 0–53)
Alkaline Phosphatase: 38 U/L — ABNORMAL LOW (ref 39–117)
Bilirubin, Direct: 0.1 mg/dL (ref 0.0–0.3)
Total Bilirubin: 0.6 mg/dL (ref 0.3–1.2)
Total Protein: 7.6 g/dL (ref 6.0–8.3)

## 2012-09-25 ENCOUNTER — Other Ambulatory Visit: Payer: Self-pay | Admitting: Vascular Surgery

## 2012-09-27 ENCOUNTER — Other Ambulatory Visit: Payer: Self-pay | Admitting: *Deleted

## 2012-09-27 DIAGNOSIS — Z48812 Encounter for surgical aftercare following surgery on the circulatory system: Secondary | ICD-10-CM

## 2012-09-27 DIAGNOSIS — I739 Peripheral vascular disease, unspecified: Secondary | ICD-10-CM

## 2012-09-27 DIAGNOSIS — R209 Unspecified disturbances of skin sensation: Secondary | ICD-10-CM

## 2012-10-03 ENCOUNTER — Other Ambulatory Visit (INDEPENDENT_AMBULATORY_CARE_PROVIDER_SITE_OTHER): Payer: BC Managed Care – PPO | Admitting: *Deleted

## 2012-10-03 ENCOUNTER — Ambulatory Visit (INDEPENDENT_AMBULATORY_CARE_PROVIDER_SITE_OTHER): Payer: BC Managed Care – PPO | Admitting: Neurosurgery

## 2012-10-03 ENCOUNTER — Encounter: Payer: Self-pay | Admitting: Neurosurgery

## 2012-10-03 VITALS — BP 109/54 | HR 51 | Resp 14 | Ht 71.0 in | Wt 172.5 lb

## 2012-10-03 DIAGNOSIS — R209 Unspecified disturbances of skin sensation: Secondary | ICD-10-CM

## 2012-10-03 DIAGNOSIS — Z48812 Encounter for surgical aftercare following surgery on the circulatory system: Secondary | ICD-10-CM

## 2012-10-03 DIAGNOSIS — I714 Abdominal aortic aneurysm, without rupture, unspecified: Secondary | ICD-10-CM

## 2012-10-03 DIAGNOSIS — R200 Anesthesia of skin: Secondary | ICD-10-CM

## 2012-10-03 DIAGNOSIS — I739 Peripheral vascular disease, unspecified: Secondary | ICD-10-CM

## 2012-10-03 NOTE — Progress Notes (Signed)
[facility] [place] AAA/PAD/PVD Office Note  CC: AAA surveillance with complaints of right lower extremity pain Referring Physician: Edilia Bo  History of Present Illness: 76 year old patient of Dr. Edilia Bo status post endograft repair of AAA in November 2011. The patient denies any unusual abdominal or back pain. Patient does have complaints of numbness in his right foot. The patient was sent by Dr. Timothy Lasso for ABIs and an outside facility. The patient was 0.7 on the right with a 1.0 on the left. The patient was examined by Dr. Edilia Bo today as well.  Past Medical History  Diagnosis Date  . Depression   . Heart murmur     corrected per pt  . Hyperlipidemia   . Hypertension   . Autoimmune hepatitis   . Coronary atherosclerosis of unspecified type of vessel, native or graft   . Peripheral vascular disease, unspecified   . Occlusion and stenosis of carotid artery without mention of cerebral infarction   . Nonspecific elevation of levels of transaminase or lactic acid dehydrogenase (LDH)   . Unspecified hemorrhoids without mention of complication   . Other chronic nonalcoholic liver disease   . Osteoarthrosis, unspecified whether generalized or localized, unspecified site   . Other and unspecified hyperlipidemia   . AAA (abdominal aortic aneurysm)   . Cancer     ROS: [x]  Positive   [ ]  Denies    General: [ ]  Weight loss, [ ]  Fever, [ ]  chills Neurologic: [ ]  Dizziness, [ ]  Blackouts, [ ]  Seizure [ ]  Stroke, [ ]  "Mini stroke", [ ]  Slurred speech, [ ]  Temporary blindness; [ ]  weakness in arms or legs, [ ]  Hoarseness Cardiac: [ ]  Chest pain/pressure, [ ]  Shortness of breath at rest [ ]  Shortness of breath with exertion, [ ]  Atrial fibrillation or irregular heartbeat Vascular: [ ]  Pain in legs with walking, [ ]  Pain in legs at rest, [ ]  Pain in legs at night,  [ ]  Non-healing ulcer, [ ]  Blood clot in vein/DVT,   Pulmonary: [ ]  Home oxygen, [ ]  Productive cough, [ ]   Coughing up blood, [ ]  Asthma,  [ ]  Wheezing Musculoskeletal:  [ ]  Arthritis, [ ]  Low back pain, [ ]  Joint pain Hematologic: [ ]  Easy Bruising, [ ]  Anemia; [ ]  Hepatitis Gastrointestinal: [ ]  Blood in stool, [ ]  Gastroesophageal Reflux/heartburn, [ ]  Trouble swallowing Urinary: [ ]  chronic Kidney disease, [ ]  on HD - [ ]  MWF or [ ]  TTHS, [ ]  Burning with urination, [ ]  Difficulty urinating Skin: [ ]  Rashes, [ ]  Wounds Psychological: [ ]  Anxiety, [ ]  Depression   Social History History  Substance Use Topics  . Smoking status: Former Smoker    Quit date: 10/17/1968  . Smokeless tobacco: Never Used     Comment: quit 1970  . Alcohol Use: No    Family History Family History  Problem Relation Age of Onset  . Colon cancer Neg Hx   . Colon polyps Father   . Heart disease Mother     No Known Allergies  Current Outpatient Prescriptions  Medication Sig Dispense Refill  . aspirin 81 MG tablet Take 81 mg by mouth daily.        Marland Kitchen atenolol (TENORMIN) 25 MG tablet Take 25 mg by mouth daily.        . fenofibrate 160 MG tablet Take 160 mg by mouth daily.        . Multiple Vitamin (MULTIVITAMIN) tablet Take 1 tablet by  mouth daily.      . sertraline (ZOLOFT) 50 MG tablet Take 50 mg by mouth daily.          Physical Examination  Filed Vitals:   10/03/12 1429  BP: 109/54  Pulse: 51  Resp: 14    Body mass index is 24.06 kg/(m^2).  General:  WDWN in NAD Gait: Normal HEENT: WNL Eyes: Pupils equal Pulmonary: normal non-labored breathing , without Rales, rhonchi,  wheezing Cardiac: RRR, without  Murmurs, rubs or gallops; No carotid bruits Abdomen: soft, NT, no masses Skin: no rashes, ulcers noted Vascular Exam/Pulses: Lower extremity pulses palpable on the left, dorsalis pedis is not heard with Doppler on the right posterior tibial is easily heard with Doppler. Dr. Edilia Bo exam the patient is well, the patient has palpable femoral pulses and questionable claudication he has no rest  pain and his pain is more described as I nerve generated pain.  Extremities without ischemic changes, no Gangrene , no cellulitis; no open wounds;  Musculoskeletal: no muscle wasting or atrophy  Neurologic: A&O X 3; Appropriate Affect ; SENSATION: normal; MOTOR FUNCTION:  moving all extremities equally. Speech is fluent/normal  Non-Invasive Vascular Imaging: AAA duplex today shows a maximum diameter of 4.3 AP by 4.2 transverse which is diminished from December 2012 when he was 5.5.  ASSESSMENT/PLAN: Dr. Edilia Bo explained to the patient that he could do an arteriogram however he does not feel that his right lower extremity  pain is truly vascular. The patient states understanding. We will obtain ABIs in 6 months and followup AAA duplex in one year. The patient is in agreement with this, his questions were encouraged and answered.  [name] ANP  Clinic M.D.: Edilia Bo

## 2012-10-04 NOTE — Addendum Note (Signed)
Addended by: Sharee Pimple on: 10/04/2012 01:52 PM   Modules accepted: Orders

## 2013-02-14 ENCOUNTER — Other Ambulatory Visit (INDEPENDENT_AMBULATORY_CARE_PROVIDER_SITE_OTHER): Payer: BC Managed Care – PPO

## 2013-02-14 DIAGNOSIS — K754 Autoimmune hepatitis: Secondary | ICD-10-CM

## 2013-02-14 LAB — HEPATIC FUNCTION PANEL
ALT: 29 U/L (ref 0–53)
AST: 49 U/L — ABNORMAL HIGH (ref 0–37)
Bilirubin, Direct: 0.1 mg/dL (ref 0.0–0.3)
Total Bilirubin: 0.5 mg/dL (ref 0.3–1.2)

## 2013-02-15 ENCOUNTER — Other Ambulatory Visit: Payer: Self-pay | Admitting: Internal Medicine

## 2013-03-13 ENCOUNTER — Other Ambulatory Visit: Payer: Self-pay | Admitting: Cardiology

## 2013-03-14 NOTE — Telephone Encounter (Signed)
..   Requested Prescriptions   Pending Prescriptions Disp Refills  . NITROSTAT 0.4 MG SL tablet [Pharmacy Med Name: NITROSTAT 0.4 MG 1/150] 25 tablet 1    Sig: DISSOLVE 1 TABLET UNDER THETONGUE AS NEEDED

## 2013-04-03 ENCOUNTER — Ambulatory Visit: Payer: BC Managed Care – PPO | Admitting: Neurosurgery

## 2013-04-03 ENCOUNTER — Other Ambulatory Visit: Payer: BC Managed Care – PPO

## 2013-04-30 ENCOUNTER — Encounter: Payer: Self-pay | Admitting: Vascular Surgery

## 2013-05-01 ENCOUNTER — Encounter: Payer: Self-pay | Admitting: Vascular Surgery

## 2013-05-01 ENCOUNTER — Encounter (INDEPENDENT_AMBULATORY_CARE_PROVIDER_SITE_OTHER): Payer: Medicare Other | Admitting: *Deleted

## 2013-05-01 ENCOUNTER — Ambulatory Visit (INDEPENDENT_AMBULATORY_CARE_PROVIDER_SITE_OTHER): Payer: Medicare Other | Admitting: Vascular Surgery

## 2013-05-01 ENCOUNTER — Other Ambulatory Visit: Payer: Self-pay | Admitting: *Deleted

## 2013-05-01 VITALS — BP 104/58 | HR 48 | Ht 71.0 in | Wt 180.0 lb

## 2013-05-01 DIAGNOSIS — Z48812 Encounter for surgical aftercare following surgery on the circulatory system: Secondary | ICD-10-CM

## 2013-05-01 DIAGNOSIS — I739 Peripheral vascular disease, unspecified: Secondary | ICD-10-CM

## 2013-05-01 DIAGNOSIS — I6529 Occlusion and stenosis of unspecified carotid artery: Secondary | ICD-10-CM

## 2013-05-01 DIAGNOSIS — I714 Abdominal aortic aneurysm, without rupture, unspecified: Secondary | ICD-10-CM

## 2013-05-01 NOTE — Assessment & Plan Note (Signed)
This patient has been some known mild bilateral carotid disease which is followed in the [place] office. Is not sure when he had his last carotid duplex scan. He does have carotid bruits and is asymptomatic. I have scheduled his carotid duplex scan office in 6 months when he comes back for his duplex of his EVAR, however if the duplex is done by the [facility] office we will cancel that study. He does know to continue taking his aspirin.

## 2013-05-01 NOTE — Progress Notes (Signed)
[facility] [place]  Patient name: Cesar [name] MRN: [id number] DOB: 04/23/1936 Sex: male  REASON FOR VISIT: follow up after EVAR  HPI: Cesar [name] is a 77 y.o. male who underwent EVAR in November of 2011. This was for a 5.3 cm infrarenal abdominal aortic aneurysm. He had a ultrasound in December 2013 which showed the aneurysm [place] 4.3 cm. At that time there was no evidence of endoleak and the graft was in good position. He's had no abdominal pain or back pain. He has had some paresthesias in his right leg and is being worked up by the neurologist. He has had an EMG the results of which are pending. He denies claudication or rest pain.  REVIEW OF SYSTEMS: Arly.Keller ] denotes positive finding; [  ] denotes negative finding  CARDIOVASCULAR:  [ ]  chest pain   [ ]  dyspnea on exertion    CONSTITUTIONAL:  [ ]  fever   [ ]  chills  PHYSICAL EXAM: Filed Vitals:   05/01/13 0916  BP: 104/58  Pulse: 48  Height: 5\' 11"  (1.803 m)  Weight: 180 lb (81.647 kg)  SpO2: 98%   Body mass index is 25.12 kg/(m^2). GENERAL: The patient is a well-nourished male, in no acute distress. The vital signs are documented above. CARDIOVASCULAR: There is a regular rate and rhythm. He does have soft bilateral carotid bruits. PULMONARY: There is good air exchange bilaterally without wheezing or rales. Abdomen is soft and nontender.  I have independently interpreted his arterial Doppler study today which shows normal Doppler signals in both feet with an ABI of 100% bilaterally.  MEDICAL ISSUES:  AAA This patient underwent Cesar [name] in November of 2011. In June of 2013 the aneurysm had shrunk to 4.3 cm. Duplex in December 2013 showed that the aneurysm was still 4.3 cm. I've recommended yearly duplex scans I've ordered a duplex scan for December of 2014. I'll plan on seeing him back in one year in December of 2015 and last is any change in his duplex in 6 months. The patient is overall doing well  status post EVAR.  CAROTID STENOSIS This patient has been some known mild bilateral carotid disease which is followed in the [place] office. Is not sure when he had his last carotid duplex scan. He does have carotid bruits and is asymptomatic. I have scheduled his carotid duplex scan office in 6 months when he comes back for his duplex of his EVAR, however if the duplex is done by the [facility] office we will cancel that study. He does know to continue taking his aspirin.   Cesar [name] S [facility] [place] Beeper: [phone]

## 2013-05-01 NOTE — Assessment & Plan Note (Signed)
This patient underwent Cesar [name] in November of 2011. In June of 2013 the aneurysm had shrunk to 4.3 cm. Duplex in December 2013 showed that the aneurysm was still 4.3 cm. I've recommended yearly duplex scans I've ordered a duplex scan for December of 2014. I'll plan on seeing him back in one year in December of 2015 and last is any change in his duplex in 6 months. The patient is overall doing well status post EVAR.

## 2013-08-02 ENCOUNTER — Ambulatory Visit (INDEPENDENT_AMBULATORY_CARE_PROVIDER_SITE_OTHER): Payer: Medicare Other | Admitting: Cardiology

## 2013-08-02 ENCOUNTER — Encounter: Payer: Self-pay | Admitting: Cardiology

## 2013-08-02 VITALS — BP 128/60 | HR 44 | Ht 70.0 in | Wt 182.4 lb

## 2013-08-02 DIAGNOSIS — I714 Abdominal aortic aneurysm, without rupture, unspecified: Secondary | ICD-10-CM

## 2013-08-02 DIAGNOSIS — I6529 Occlusion and stenosis of unspecified carotid artery: Secondary | ICD-10-CM

## 2013-08-02 DIAGNOSIS — I2510 Atherosclerotic heart disease of native coronary artery without angina pectoris: Secondary | ICD-10-CM

## 2013-08-02 DIAGNOSIS — I739 Peripheral vascular disease, unspecified: Secondary | ICD-10-CM

## 2013-08-02 NOTE — Patient Instructions (Signed)
The current medical regimen is effective;  continue present plan and medications.  Your physician has requested that you have an exercise tolerance test. For further information please visit [email or web address]. Please also follow instruction sheet, as given.   

## 2013-08-02 NOTE — Progress Notes (Signed)
HPI The patient presents for follow up of CAD.  Since I last saw him he has done well.  He is active and walks a mile or two everyday.   The patient denies any new symptoms such as chest discomfort, neck or arm discomfort. There has been no new shortness of breath, PND or orthopnea. There have been no reported palpitations, presyncope or syncope.  His heart rate runs low but he does not feel this.    No Known Allergies  Current Outpatient Prescriptions  Medication Sig Dispense Refill  . aspirin 81 MG tablet Take 81 mg by mouth daily.        Marland Kitchen atenolol (TENORMIN) 25 MG tablet Take 25 mg by mouth daily.        . fenofibrate 160 MG tablet Take 160 mg by mouth daily.        . Memantine HCl ER (NAMENDA XR) 21 MG CP24 Take 1 tablet by mouth daily.      . Multiple Vitamin (MULTIVITAMIN) tablet Take 1 tablet by mouth daily.      Marland Kitchen NITROSTAT 0.4 MG SL tablet DISSOLVE 1 TABLET UNDER THETONGUE AS NEEDED  25 tablet  1  . sertraline (ZOLOFT) 50 MG tablet Take 50 mg by mouth daily.        . fluorouracil (EFUDEX) 5 % cream Apply topically as needed.       No current facility-administered medications for this visit.    Past Medical History  Diagnosis Date  . Depression   . Heart murmur     corrected per pt  . Hyperlipidemia   . Hypertension   . Autoimmune hepatitis   . Coronary atherosclerosis of unspecified type of vessel, native or graft   . Peripheral vascular disease, unspecified   . Occlusion and stenosis of carotid artery without mention of cerebral infarction   . Nonspecific elevation of levels of transaminase or lactic acid dehydrogenase (LDH)   . Unspecified hemorrhoids without mention of complication   . Other chronic nonalcoholic liver disease   . Osteoarthrosis, unspecified whether generalized or localized, unspecified site   . Other and unspecified hyperlipidemia   . AAA (abdominal aortic aneurysm)   . Cancer     Past Surgical History  Procedure Laterality Date  . Cervical  discectomy    . Triple a indo vascular graft    . Polypectomy    . Appendectomy    . Tonsillectomy    . Skin cancer excision    . Abdominal aortic aneurysm repair    . Coronary artery bypass graft  2001    x4    ROS:  Recent cold like symptoms.  As stated in the HPI and negative for all other systems.  PHYSICAL EXAM BP 128/60  Pulse 44  Ht 5\' 10"  (1.778 m)  Wt 182 lb 6.4 oz (82.736 kg)  BMI 26.17 kg/m2 GENERAL:  Well appearing NECK:  No jugular venous distention, waveform within normal limits, carotid upstroke brisk and symmetric, bilateral bruits, no thyromegaly LYMPHATICS:  No cervical, inguinal adenopathy LUNGS:  Clear to auscultation bilaterally BACK:  No CVA tenderness CHEST: Well healed sternotomy scar. HEART:  PMI not displaced or sustained,S1 and S2 within normal limits, no S3, no S4, no clicks, no rubs, no murmurs ABD:  Flat, positive bowel sounds normal in frequency in pitch, positive abdominal bruits, no rebound, no guarding, no midline pulsatile mass, midline bruit, no hepatomegaly, no splenomegaly EXT:  2 plus pulses throughout, no edema, no cyanosis no clubbing  EKG:   Sinus bradycardia, rate 44 , axis within normal limits, premature ectopic atrial complexes , no acute ST-T wave. 08/02/2013  ASSESSMENT AND PLAN  CAD -  The patient has no new symptoms since a stress perfusion study on 2011.   His bypass grafts are now 75 years old.  I will bring the patient back for a POET (Plain Old Exercise Test). This will allow me to screen for obstructive coronary disease, risk stratify and very importantly provide a prescription for exercise.  CAROTID STENOSIS -  He has scheduled follow up Dopplers with Dr. Edilia Bo in January.  HYPERTENSION NEC -  The blood pressure is at target. No change in medications is indicated. We will continue with therapeutic lifestyle changes (TLC).  DYSLIPIDEMIA -  This is followed by Gwen Pounds, MD.  I will defer to his management.

## 2013-08-22 ENCOUNTER — Other Ambulatory Visit: Payer: Self-pay

## 2013-08-22 ENCOUNTER — Other Ambulatory Visit (INDEPENDENT_AMBULATORY_CARE_PROVIDER_SITE_OTHER): Payer: Medicare Other

## 2013-08-22 DIAGNOSIS — R7989 Other specified abnormal findings of blood chemistry: Secondary | ICD-10-CM

## 2013-08-22 LAB — HEPATIC FUNCTION PANEL
Albumin: 3.7 g/dL (ref 3.5–5.2)
Alkaline Phosphatase: 43 U/L (ref 39–117)
Total Protein: 7.5 g/dL (ref 6.0–8.3)

## 2013-09-16 ENCOUNTER — Ambulatory Visit (INDEPENDENT_AMBULATORY_CARE_PROVIDER_SITE_OTHER): Payer: Medicare Other | Admitting: Physician Assistant

## 2013-09-16 DIAGNOSIS — I2510 Atherosclerotic heart disease of native coronary artery without angina pectoris: Secondary | ICD-10-CM

## 2013-09-16 DIAGNOSIS — R9439 Abnormal result of other cardiovascular function study: Secondary | ICD-10-CM

## 2013-09-16 NOTE — Progress Notes (Signed)
Exercise Treadmill Test  Pre-Exercise Testing Evaluation Rhythm: Sinus Bradycardia Rate: 43     Test  Exercise Tolerance Test Ordering MD: Angelina Sheriff, MD  Interpreting MD: Tereso Newcomer, PA-C  Unique Test No: 1  Treadmill:  1  Indication for ETT: known ASHD  Contraindication to ETT: No   Stress Modality: exercise - treadmill  Cardiac Imaging Performed: non   Protocol: standard Bruce - maximal  Max BP:  157/68  Max MPHR (bpm):  145 85% MPR (bpm):  123  MPHR obtained (bpm):  100 % MPHR obtained:  69  Reached 85% MPHR (min:sec):  n/a Total Exercise Time (min-sec):  8:23  Workload in METS:  10.1 Borg Scale: 15  Reason ETT Terminated:  fatigue    ST Segment Analysis At Rest: normal ST segments - no evidence of significant ST depression With Exercise: significant ischemic ST depression  Other Information Arrhythmia:  No Angina during ETT:  absent (0) Quality of ETT:  non-diagnostic  ETT Interpretation:  abnormal - evidence of ST depression consistent with ischemia at submaximal exercise  Comments: Good exercise capacity. No chest pain. Normal BP response to exercise. There was significant ST depression at peak exercise c/w ischemia. Submaximal heart rate was achieved.   Recommendations: Will arrange [employer]. Signed,  Tereso Newcomer, PA-C   09/16/2013 10:04 AM

## 2013-09-19 ENCOUNTER — Encounter: Payer: Self-pay | Admitting: Neurology

## 2013-09-23 ENCOUNTER — Ambulatory Visit (INDEPENDENT_AMBULATORY_CARE_PROVIDER_SITE_OTHER): Payer: Medicare Other | Admitting: Neurology

## 2013-09-23 ENCOUNTER — Encounter (INDEPENDENT_AMBULATORY_CARE_PROVIDER_SITE_OTHER): Payer: Self-pay

## 2013-09-23 ENCOUNTER — Encounter: Payer: Self-pay | Admitting: Neurology

## 2013-09-23 VITALS — BP 125/57 | HR 60 | Ht 72.0 in | Wt 183.0 lb

## 2013-09-23 DIAGNOSIS — R413 Other amnesia: Secondary | ICD-10-CM

## 2013-09-23 HISTORY — DX: Other amnesia: R41.3

## 2013-09-23 NOTE — Progress Notes (Signed)
Reason for visit: Memory disorder  Cesar [name] is a 77 y.o. male  History of present illness:  Cesar [name] is a 77 year old right-handed white male with a history of a mild memory disorder. The patient initially was seen through this office on 11/05/2010. The patient at that time gave a 2 or 3 year history of mild memory issues. The patient was set up for MRI evaluation that showed minimal small vessel changes, and blood work was unremarkable. The patient never returned for followup. The patient apparently was placed on Namenda at some point, but he recently stopped the medication, and he was switched to Aricept. The patient is on 10 mg dosing at nighttime, and he has been on this for about one month. The patient is tolerating the medication relatively well, but he does have occasional diarrhea. The patient reports that his memory has changed slightly over time. The patient will have good days and bad days with the memory. The patient is operating a motor vehicle without difficulty, and he carries a notepad around with him to take notes. The patient has not given up any activities of daily living secondary to his memory. His wife does help him with remembering appointments. The patient denies any focal numbness or weakness of the face, arms, or legs. The patient does have a mild balance issue. The patient indicates that he has some numbness and some discomfort involving the right leg, and prior lumbosacral spine surgery. The patient returns to this office for an evaluation. The patient does have some chronic neck discomfort, and some headaches coming up from the back of the neck.  Past Medical History  Diagnosis Date  . Depression   . Heart murmur     corrected per pt  . Hyperlipidemia   . Hypertension   . Autoimmune hepatitis   . Coronary atherosclerosis of unspecified type of vessel, native or graft   . Peripheral vascular disease, unspecified   . Occlusion and stenosis of carotid  artery without mention of cerebral infarction   . Nonspecific elevation of levels of transaminase or lactic acid dehydrogenase (LDH)   . Unspecified hemorrhoids without mention of complication   . Other chronic nonalcoholic liver disease   . Osteoarthrosis, unspecified whether generalized or localized, unspecified site   . Other and unspecified hyperlipidemia   . AAA (abdominal aortic aneurysm)   . Cancer   . Memory deficits 09/23/2013  . Cervicogenic headache     Past Surgical History  Procedure Laterality Date  . Lumbar disc surgery    . Triple a indo vascular graft  8/09  . Polypectomy    . Appendectomy    . Tonsillectomy    . Skin cancer excision    . Abdominal aortic aneurysm repair    . Coronary artery bypass graft  2001    x4    Family History  Problem Relation Age of Onset  . Colon cancer Neg Hx   . Colon polyps Father   . Heart disease Mother   . Multiple sclerosis Sister   . Diabetes Sister     Social history:  reports that he quit smoking about 44 years ago. He has never used smokeless tobacco. He reports that he does not drink alcohol or use illicit drugs.  Medications:  Current Outpatient Prescriptions on File Prior to Visit  Medication Sig Dispense Refill  . aspirin 81 MG tablet Take 81 mg by mouth daily.        Marland Kitchen atenolol (TENORMIN)  25 MG tablet Take 25 mg by mouth daily.        Marland Kitchen donepezil (ARICEPT) 10 MG tablet Take 10 mg by mouth daily.      . fenofibrate 160 MG tablet Take 160 mg by mouth daily.        . fluorouracil (EFUDEX) 5 % cream Apply topically as needed.      . Multiple Vitamin (MULTIVITAMIN) tablet Take 1 tablet by mouth daily.      Marland Kitchen NITROSTAT 0.4 MG SL tablet DISSOLVE 1 TABLET UNDER THETONGUE AS NEEDED  25 tablet  1  . sertraline (ZOLOFT) 50 MG tablet Take 50 mg by mouth daily.         No current facility-administered medications on file prior to visit.     No Known Allergies  ROS:  Out of a complete 14 system review of symptoms, the  patient complains only of the following symptoms, and all other reviewed systems are negative.  Eye pain Snoring Memory loss, headache Restless legs Muscle cramps  Blood pressure 125/57, pulse 60, height 6' (1.829 m), weight 183 lb (83.008 kg).  Physical Exam  General: The patient is alert and cooperative at the time of the examination.  Head: Pupils are equal, round, and reactive to light. Discs are flat bilaterally.  Neck: The neck is supple, bilateral carotid bruits are noted.  Respiratory: The respiratory examination is clear.  Cardiovascular: The cardiovascular examination reveals a regular rate and rhythm, no obvious murmurs or rubs are noted.  Skin: Extremities are without significant edema.  Neurologic Exam  Mental status: Mini-Mental status examination done today shows a total score of 28/30.  Cranial nerves: Facial symmetry is present. There is good sensation of the face to pinprick and soft touch bilaterally. The strength of the facial muscles and the muscles to head turning and shoulder shrug are normal bilaterally. Speech is well enunciated, no aphasia or dysarthria is noted. Extraocular movements are full. Visual fields are full.  Motor: The motor testing reveals 5 over 5 strength of all 4 extremities. Good symmetric motor tone is noted throughout.  Sensory: Sensory testing is intact to pinprick, soft touch, vibration sensation, and position sense on all 4 extremities, with exception that position sense is decreased on the right foot. No evidence of extinction is noted.  Coordination: Cerebellar testing reveals good finger-nose-finger and heel-to-shin bilaterally.  Gait and station: Gait is normal. Tandem gait is slightly unsteady. Romberg is negative. No drift is seen.  Reflexes: Deep tendon reflexes are symmetric and normal bilaterally. Toes are downgoing bilaterally.   Assessment/Plan:  One. Memory disturbance  2. Bilateral carotid bruits  The patient  has been set up for a carotid Doppler study that has not yet been done. The patient does have a mild memory disturbance, but he has dropped only one point on the Mini-Mental status examination from 2012 until the present date. The progression of memory is very slow. The patient is on Aricept, and he will remain on this medication for now. The patient will followup in 9 months.  Marlan Palau MD 09/23/2013 9:10 AM  [facility] [address] [place], [place] [address]  Phone [phone] Fax [phone]

## 2013-09-23 NOTE — Patient Instructions (Signed)
Began Aricept (donepezil) at 5 mg at night for one month. If this medication is well-tolerated, please call our office and we will call in a prescription for the 10 mg tablets. Look out for side effects that may include nausea, diarrhea, weight loss, or stomach cramps. This medication will also cause a runny nose, therefore there is no need for allergy medications for this purpose.    

## 2013-09-25 ENCOUNTER — Encounter: Payer: Self-pay | Admitting: Internal Medicine

## 2013-10-02 ENCOUNTER — Ambulatory Visit (HOSPITAL_COMMUNITY): Payer: Medicare Other | Attending: Cardiology | Admitting: Radiology

## 2013-10-02 ENCOUNTER — Encounter: Payer: Self-pay | Admitting: Cardiology

## 2013-10-02 VITALS — BP 127/67 | HR 42 | Ht 70.0 in | Wt 176.0 lb

## 2013-10-02 DIAGNOSIS — I2510 Atherosclerotic heart disease of native coronary artery without angina pectoris: Secondary | ICD-10-CM

## 2013-10-02 DIAGNOSIS — Z951 Presence of aortocoronary bypass graft: Secondary | ICD-10-CM | POA: Insufficient documentation

## 2013-10-02 DIAGNOSIS — I739 Peripheral vascular disease, unspecified: Secondary | ICD-10-CM | POA: Insufficient documentation

## 2013-10-02 DIAGNOSIS — I252 Old myocardial infarction: Secondary | ICD-10-CM | POA: Insufficient documentation

## 2013-10-02 DIAGNOSIS — R9439 Abnormal result of other cardiovascular function study: Secondary | ICD-10-CM

## 2013-10-02 MED ORDER — TECHNETIUM TC 99M SESTAMIBI GENERIC - CARDIOLITE
11.0000 | Freq: Once | INTRAVENOUS | Status: AC | PRN
  Administered 2013-10-02: 11 via INTRAVENOUS

## 2013-10-02 MED ORDER — REGADENOSON 0.4 MG/5ML IV SOLN
0.4000 mg | Freq: Once | INTRAVENOUS | Status: AC
  Administered 2013-10-02: 0.4 mg via INTRAVENOUS

## 2013-10-02 MED ORDER — TECHNETIUM TC 99M SESTAMIBI GENERIC - CARDIOLITE
33.0000 | Freq: Once | INTRAVENOUS | Status: AC | PRN
  Administered 2013-10-02: 33 via INTRAVENOUS

## 2013-10-02 NOTE — Progress Notes (Signed)
[facility] SITE 3 NUCLEAR MED [address] [place], [place] [address] [phone]    Cardiology Nuclear Med Study  Cesar [name] is a 77 y.o. male     MRN : [id number]     DOB: 1936/09/10  Procedure Date: 10/02/2013  Nuclear Med Background Indication for Stress Test:  Evaluation for Ischemia, Graft Patency and 09-16-13 Abnormal ETT History:  CAD, MI, Cath, CABG x4, MPI 2011 EF 58% Cardiac Risk Factors: Carotid Disease, Family History - CAD, History of Smoking, Hypertension, Lipids and PVD  Symptoms:  Non indicated   Nuclear Pre-Procedure Caffeine/Decaff Intake:  None > 12 hrs NPO After: 6:00pm   Lungs:  clear O2 Sat: 95% on room air. IV 0.9% NS with Angio Cath:  20g  IV Site: R Antecubital x 1, tolerated well IV Started by:  Irean Hong, RN  Chest Size (in):  42 Cup Size: n/a  Height: 5\' 10"  (1.778 m)  Weight:  176 lb (79.833 kg)  BMI:  Body mass index is 25.25 kg/(m^2). Tech Comments:  Patient took Atenolol this am    Nuclear Med Study 1 or 2 day study: 1 day  Stress Test Type:  Treadmill/Lexiscan  Reading MD: Tobias Alexander, MD  Order Authorizing Provider:  Rollene Rotunda, MD  Resting Radionuclide: Technetium 54m Sestamibi  Resting Radionuclide Dose: 11.0 mCi   Stress Radionuclide:  Technetium 20m Sestamibi  Stress Radionuclide Dose: 33.0 mCi           Stress Protocol Rest HR: 42 Stress HR: 74  Rest BP: 127/67 Stress BP: 136/62  Exercise Time (min): n/a METS: n/a           Dose of Adenosine (mg):  n/a Dose of Lexiscan: 0.4 mg  Dose of Atropine (mg): n/a Dose of Dobutamine: n/a mcg/kg/min (at max HR)  Stress Test Technologist: Ming Kunka Chimes, BS-ES  Nuclear Technologist:  Domenic Polite, CNMT     Rest Procedure:  Myocardial perfusion imaging was performed at rest 45 minutes following the intravenous administration of Technetium 79m Sestamibi. Rest ECG: NSR - Normal EKG  Stress Procedure:  The patient received IV Lexiscan 0.4 mg over  15-seconds with concurrent low level exercise and then Technetium 57m Sestamibi was injected at 30-seconds while the patient continued walking one more minute.  Quantitative spect images were obtained after a 45-minute delay.  During the infusion of Lexiscan, the patient complained of his head feeling tight.  This resolved quickly in recovery.  Stress ECG: No significant change from baseline ECG  QPS Raw Data Images:  Mild diaphragmatic attenuation.  Normal left ventricular size. Stress Images:  Normal homogeneous uptake in all areas of the myocardium. Rest Images:  Normal homogeneous uptake in all areas of the myocardium. Subtraction (SDS):  No evidence of ischemia. Transient Ischemic Dilatation (Normal <1.22):  1.08 Lung/Heart Ratio (Normal <0.45):  0.25  Quantitative Gated Spect Images QGS EDV:  106 ml QGS ESV:  43 ml  Impression Exercise Capacity:  Lexiscan with low level exercise. BP Response:  Normal blood pressure response. Clinical Symptoms:  No significant symptoms noted. ECG Impression:  No significant ST segment change suggestive of ischemia. Comparison with Prior Nuclear Study: No significant change from previous study  Overall Impression:  Normal stress nuclear study.  LV Ejection Fraction: 59%.  LV Wall Motion:  NL LV Function; NL Wall Motion  Tobias Alexander, Rexene Edison 10/02/2013

## 2013-10-03 ENCOUNTER — Encounter: Payer: Self-pay | Admitting: Physician Assistant

## 2013-10-03 NOTE — Progress Notes (Signed)
Quick Note:  Patient notified of results from Stress Test from 10/02/13. Reviewed results in detail and advised that Tereso Newcomer, PA, noted that "Stress Test Normal". Patient verbalized appreciation and agreement with current treatment plan. ______

## 2013-10-22 ENCOUNTER — Encounter: Payer: Self-pay | Admitting: Family

## 2013-10-23 ENCOUNTER — Ambulatory Visit (HOSPITAL_COMMUNITY)
Admission: RE | Admit: 2013-10-23 | Discharge: 2013-10-23 | Disposition: A | Discharge status: Home / Self Care | Payer: Medicare Other | Source: Ambulatory Visit | Attending: Vascular Surgery | Admitting: Vascular Surgery

## 2013-10-23 ENCOUNTER — Ambulatory Visit (INDEPENDENT_AMBULATORY_CARE_PROVIDER_SITE_OTHER): Payer: Medicare Other | Admitting: Family

## 2013-10-23 ENCOUNTER — Encounter: Payer: Self-pay | Admitting: Family

## 2013-10-23 ENCOUNTER — Ambulatory Visit: Payer: BC Managed Care – PPO | Admitting: Neurosurgery

## 2013-10-23 DIAGNOSIS — I714 Abdominal aortic aneurysm, without rupture, unspecified: Secondary | ICD-10-CM | POA: Insufficient documentation

## 2013-10-23 DIAGNOSIS — I6529 Occlusion and stenosis of unspecified carotid artery: Secondary | ICD-10-CM

## 2013-10-23 DIAGNOSIS — I658 Occlusion and stenosis of other precerebral arteries: Secondary | ICD-10-CM | POA: Insufficient documentation

## 2013-10-23 DIAGNOSIS — Z48812 Encounter for surgical aftercare following surgery on the circulatory system: Secondary | ICD-10-CM

## 2013-10-23 NOTE — Patient Instructions (Addendum)
Abdominal Aortic Aneurysm An aneurysm is a weakened or damaged part of an artery wall that bulges from the normal force of blood pumping through the body. An abdominal aortic aneurysm is an aneurysm that occurs in the lower part of the aorta, the main artery of the body.  The major concern with an abdominal aortic aneurysm is that it can enlarge and burst (rupture) or blood can flow between the layers of the wall of the aorta through a tear (aorticdissection). Both of these conditions can cause bleeding inside the body and can be life threatening unless diagnosed and treated promptly. CAUSES  The exact cause of an abdominal aortic aneurysm is unknown. Some contributing factors are:   A hardening of the arteries caused by the buildup of fat and other substances in the lining of a blood vessel (arteriosclerosis).  Inflammation of the walls of an artery (arteritis).   Connective tissue diseases, such as Marfan syndrome.   Abdominal trauma.   An infection, such as syphilis or staphylococcus, in the wall of the aorta (infectious aortitis) caused by bacteria. RISK FACTORS  Risk factors that contribute to an abdominal aortic aneurysm may include:  Age older than 60 years.   High blood pressure (hypertension).  Male gender.  Ethnicity (white race).  Obesity.  Family history of aneurysm (first degree relatives only).  Tobacco use. PREVENTION  The following healthy lifestyle habits may help decrease your risk of abdominal aortic aneurysm:  Quitting smoking. Smoking can raise your blood pressure and cause arteriosclerosis.  Limiting or avoiding alcohol.  Keeping your blood pressure, blood sugar level, and cholesterol levels within normal limits.  Decreasing your salt intake. In somepeople, too much salt can raise blood pressure and increase your risk of abdominal aortic aneurysm.  Eating a diet low in saturated fats and cholesterol.  Increasing your fiber intake by including  whole grains, vegetables, and fruits in your diet. Eating these foods may help lower blood pressure.  Maintaining a healthy weight.  Staying physically active and exercising regularly. SYMPTOMS  The symptoms of abdominal aortic aneurysm may vary depending on the size and rate of growth of the aneurysm.Most grow slowly and do not have any symptoms. When symptoms do occur, they may include:  Pain (abdomen, side, lower back, or groin). The pain may vary in intensity. A sudden onset of severe pain may indicate that the aneurysm has ruptured.  Feeling full after eating only small amounts of food.  Nausea or vomiting or both.  Feeling a pulsating lump in the abdomen.  Feeling faint or passing out. DIAGNOSIS  Since most unruptured abdominal aortic aneurysms have no symptoms, they are often discovered during diagnostic exams for other conditions. An aneurysm may be found during the following procedures:  Ultrasonography (A one-time screening for abdominal aortic aneurysm by ultrasonography is also recommended for all men aged 65-75 years who have ever smoked).  X-ray exams.  A computed tomography (CT).  Magnetic resonance imaging (MRI).  Angiography or arteriography. TREATMENT  Treatment of an abdominal aortic aneurysm depends on the size of your aneurysm, your age, and risk factors for rupture. Medication to control blood pressure and pain may be used to manage aneurysms smaller than 6 cm. Regular monitoring for enlargement may be recommended by your caregiver if:  The aneurysm is 3 4 cm in size (an annual ultrasonography may be recommended).  The aneurysm is 4 4.5 cm in size (an ultrasonography every 6 months may be recommended).  The aneurysm is larger than 4.5   cm in size (your caregiver may ask that you be examined by a vascular surgeon). If your aneurysm is larger than 6 cm, surgical repair may be recommended. There are two main methods for repair of an aneurysm:   Endovascular  repair (a minimally invasive surgery). This is done most often.  Open repair. This method is used if an endovascular repair is not possible. Document Released: 07/13/2005 Document Revised: 01/28/2013 Document Reviewed: 11/02/2012 [employer] Patient Information 2014 [employer].   Stroke Prevention Some medical conditions and behaviors are associated with an increased chance of having a stroke. You may prevent a stroke by making healthy choices and managing medical conditions. Reduce your risk of having a stroke by:  Staying physically active. Get at least 30 minutes of activity on most or all days.  Not smoking. It may also be helpful to avoid exposure to secondhand smoke.  Limiting alcohol use. Moderate alcohol use is considered to be:  No more than 2 drinks per day for men.  No more than 1 drink per day for nonpregnant women.  Eating healthy foods.  Include 5 or more servings of fruits and vegetables a day.  Certain diets may be prescribed to address high blood pressure, high cholesterol, diabetes, or obesity.  Managing your cholesterol levels.  A low-saturated fat, low-trans fat, low-cholesterol, and high-fiber diet may control cholesterol levels.  Take any prescribed medicines to control cholesterol as directed by your caregiver.  Managing your diabetes.  A controlled-carbohydrate, controlled-sugar diet is recommended to manage diabetes.  Take any prescribed medicines to control diabetes as directed by your caregiver.  Controlling your high blood pressure (hypertension).  A low-salt (sodium), low-saturated fat, low-trans fat, and low-cholesterol diet is recommended to manage high blood pressure.  Take any prescribed medicines to control hypertension as directed by your caregiver.  Maintaining a healthy weight.  A reduced-calorie, low-sodium, low-saturated fat, low-trans fat, low-cholesterol diet is recommended to manage weight.  Stopping drug abuse.  Avoiding  birth control pills.  Talk to your caregiver about the risks of taking birth control pills if you are over 35 years old, smoke, get migraines, or have ever had a blood clot.  Getting evaluated for sleep disorders (sleep apnea).  Talk to your caregiver about getting a sleep evaluation if you snore a lot or have excessive sleepiness.  Taking medicines as directed by your caregiver.  For some people, aspirin or blood thinners (anticoagulants) are helpful in reducing the risk of forming abnormal blood clots that can lead to stroke. If you have the irregular heart rhythm of atrial fibrillation, you should be on a blood thinner unless there is a good reason you cannot take them.  Understand all your medicine instructions. SEEK IMMEDIATE MEDICAL CARE IF:   You have sudden weakness or numbness of the face, arm, or leg, especially on one side of the body.  You have sudden confusion.  You have trouble speaking (aphasia) or understanding.  You have sudden trouble seeing in one or both eyes.  You have sudden trouble walking.  You have dizziness.  You have a loss of balance or coordination.  You have a sudden, severe headache with no known cause.  You have new chest pain or an irregular heartbeat. Any of these symptoms may represent a serious problem that is an emergency. Do not wait to see if the symptoms will go away. Get medical help right away. Call your local emergency services (911 in [place] Do not drive yourself to the hospital. Document

## 2013-10-23 NOTE — Progress Notes (Signed)
[facility] [place] HISTORY AND PHYSICAL MRN : [id number]  History of Present Illness:   Cesar [name] is a 78 y.o. male patient of Dr. Scot Dock who is s/p EVAR in November, 2011. He returns today for routine post EVAR surveillance. Since his past medical history indicates occlusion and stenosis of carotid artery without mention of cerebral infarction, Dr. Scot Dock decided at his last visit to include carotid arteries Duplex evaluation for this visit.  He denies ever having  stroke or TIA symptoms, denies claudication with walking, denies non-healing wounds, denies new medical problems or new surgical hx since last seen. He has numbness in the lateral aspect of right foot, this has been evaluated, unknown etiology; it is noted that he has a history of lumbar disc surgery. He denies abdominal or back pain. He denies ever having an MI, had 4 vessel CABG in 2001.  Pt Diabetic: No Pt smoker: former smoker, quit in 2000  Current Outpatient Prescriptions  Medication Sig Dispense Refill  . aspirin 81 MG tablet Take 81 mg by mouth daily.        Marland Kitchen atenolol (TENORMIN) 25 MG tablet Take 25 mg by mouth daily.        Marland Kitchen donepezil (ARICEPT) 10 MG tablet Take 10 mg by mouth daily.      . fenofibrate 160 MG tablet Take 160 mg by mouth daily.        . fluorouracil (EFUDEX) 5 % cream Apply topically as needed.      . Multiple Vitamin (MULTIVITAMIN) tablet Take 1 tablet by mouth daily.      Marland Kitchen NITROSTAT 0.4 MG SL tablet DISSOLVE 1 TABLET UNDER THETONGUE AS NEEDED  25 tablet  1  . sertraline (ZOLOFT) 50 MG tablet Take 50 mg by mouth daily.         No current facility-administered medications for this visit.   Pt meds include: Statin :No ASA: Yes Other anticoagulants/antiplatelets: no  Past Medical History  Diagnosis Date  . Depression   . Heart murmur     corrected per pt  . Hyperlipidemia   . Hypertension   . Autoimmune hepatitis   . Coronary atherosclerosis of  unspecified type of vessel, native or graft   . Peripheral vascular disease, unspecified   . Occlusion and stenosis of carotid artery without mention of cerebral infarction   . Nonspecific elevation of levels of transaminase or lactic acid dehydrogenase (LDH)   . Unspecified hemorrhoids without mention of complication   . Other chronic nonalcoholic liver disease   . Osteoarthrosis, unspecified whether generalized or localized, unspecified site   . Other and unspecified hyperlipidemia   . AAA (abdominal aortic aneurysm)   . Cancer   . Memory deficits 09/23/2013  . Cervicogenic headache   . Hx of cardiovascular stress test     LexiScan Myoview (12/14):  No ischemia, EF 59%, normal study.    Past Surgical History  Procedure Laterality Date  . Lumbar disc surgery    . Triple a indo vascular graft  8/09  . Polypectomy    . Appendectomy    . Tonsillectomy    . Skin cancer excision    . Abdominal aortic aneurysm repair    . Coronary artery bypass graft  2001    x4    Social History History  Substance Use Topics  . Smoking status: Former Smoker    Quit date: 10/17/1968  . Smokeless tobacco: Never Used     Comment: quit 1970  .  Alcohol Use: No    Family History Family History  Problem Relation Age of Onset  . Colon cancer Neg Hx   . Colon polyps Father   . Heart disease Mother   . Hyperlipidemia Mother   . Heart attack Mother   . Multiple sclerosis Sister   . Diabetes Sister    No Known Allergies   REVIEW OF SYSTEMS: See HPI for pertinent positives and negatives.  Physical Examination Filed Vitals:   10/23/13 0944 10/23/13 0947  BP: 126/72 123/61  Pulse: 45 44  Resp:  16  Height:  5\' 11"  (1.803 m)  Weight:  177 lb (80.287 kg)  SpO2:  95%   Body mass index is 24.7 kg/(m^2).  General:  WDWN in NAD Gait: Normal HENT: WNL Eyes: Pupils equal Pulmonary: normal non-labored breathing , without Rales, rhonchi,  wheezing Cardiac: RRR, without  Murmurs, rubs or  gallops.  Abdomen: soft, NT, no masses Skin: no rashes or ulcers noted;  no Gangrene , no cellulitis; no open wounds;   VASCULAR EXAM  Carotid Bruits Left Right   Negative Negative           Aorta is slightly palpable Radial pulses are 2+ and =                  VASCULAR EXAM: Extremities without ischemic changes  without Gangrene; without open wounds.                                                                                                          LE Pulses LEFT RIGHT       FEMORAL   palpable   palpable        POPLITEAL  not palpable   not palpable       POSTERIOR TIBIAL  not palpable   not palpable        DORSALIS PEDIS      ANTERIOR TIBIAL not palpable  not palpable    Musculoskeletal: no muscle wasting or atrophy; no edema  Neurologic: A&O X 3; Appropriate Affect ;  SENSATION: normal; MOTOR FUNCTION: 5/5 Symmetric, CN 2-12 intact Speech is fluent/normal  Non-Invasive Vascular Imaging (10/23/2013):  AAA Duplex: Largest diameter of aorta: Today: 4.17 cm, no endoleak 10/03/13: 4.3 cm  Carotid arteries Duplex: Bilateral internal carotid artery velocities suggest <40% stenosis.  ASSESSMENT:  Cesar [name] is a 78 y.o. male who is s/p EVAR in November, 2011. His AAA sac size has decrease to 4.17 cm from 4.3 cm in the last year, no endo leak. His internal carotid arteries have minimal plaque. He is asymptomatic: denies abdominal or back pain, denies any history of stroke or TIA.  PLAN:   Return in 1 year for EVAR Duplex.  I discussed in depth with the patient the nature of atherosclerosis, and emphasized the importance of maximal medical management including strict control of blood pressure, blood glucose, and lipid levels, obtaining regular exercise, and cessation of smoking.  The patient is aware that without maximal medical management the underlying atherosclerotic disease process will progress,  limiting the benefit of any interventions.  The patient  was given information about stroke prevention and what symptoms should prompt the patient to seek immediate medical care.  The patient was given information about AAA including signs, symptoms, treatment,  what symptoms should prompt the patient to seek immediate medical care, and how to minimize the risk of enlargement and rupture of aneurysms.   Clemon Chambers, RN, MSN, FNP-C [facility] Office: [phone]  Clinic MD: Scot Dock 10/23/2013 10:08 AM

## 2013-12-05 ENCOUNTER — Other Ambulatory Visit: Payer: Self-pay | Admitting: Internal Medicine

## 2013-12-05 DIAGNOSIS — G44009 Cluster headache syndrome, unspecified, not intractable: Secondary | ICD-10-CM

## 2013-12-05 DIAGNOSIS — R42 Dizziness and giddiness: Secondary | ICD-10-CM

## 2013-12-08 ENCOUNTER — Ambulatory Visit
Admission: RE | Admit: 2013-12-08 | Discharge: 2013-12-08 | Disposition: A | Discharge status: Home / Self Care | Payer: Medicare Other | Source: Ambulatory Visit | Attending: Internal Medicine | Admitting: Internal Medicine

## 2013-12-08 DIAGNOSIS — G44009 Cluster headache syndrome, unspecified, not intractable: Secondary | ICD-10-CM

## 2013-12-08 DIAGNOSIS — R42 Dizziness and giddiness: Secondary | ICD-10-CM

## 2013-12-08 MED ORDER — GADOBENATE DIMEGLUMINE 529 MG/ML IV SOLN
17.0000 mL | Freq: Once | INTRAVENOUS | Status: AC | PRN
  Administered 2013-12-08: 17 mL via INTRAVENOUS

## 2013-12-10 ENCOUNTER — Inpatient Hospital Stay: Admission: RE | Admit: 2013-12-10 | Payer: Medicare Other | Source: Ambulatory Visit

## 2014-02-17 ENCOUNTER — Telehealth: Payer: Self-pay

## 2014-02-17 NOTE — Telephone Encounter (Signed)
Pt knows to come for labs. 

## 2014-02-17 NOTE — Telephone Encounter (Signed)
Message copied by Algernon Huxley on Mon Feb 17, 2014  2:16 PM ------      Message from: HUNT, [place] R      Created: Thu Aug 22, 2013  4:20 PM       LFT's in 6 mth ------

## 2014-02-18 ENCOUNTER — Other Ambulatory Visit (INDEPENDENT_AMBULATORY_CARE_PROVIDER_SITE_OTHER): Payer: Medicare Other

## 2014-02-18 DIAGNOSIS — R945 Abnormal results of liver function studies: Principal | ICD-10-CM

## 2014-02-18 DIAGNOSIS — R7989 Other specified abnormal findings of blood chemistry: Secondary | ICD-10-CM

## 2014-02-18 LAB — HEPATIC FUNCTION PANEL
ALT: 38 U/L (ref 0–53)
AST: 66 U/L — ABNORMAL HIGH (ref 0–37)
Albumin: 3.8 g/dL (ref 3.5–5.2)
Alkaline Phosphatase: 43 U/L (ref 39–117)
BILIRUBIN DIRECT: 0.1 mg/dL (ref 0.0–0.3)
TOTAL PROTEIN: 7.6 g/dL (ref 6.0–8.3)
Total Bilirubin: 0.6 mg/dL (ref 0.2–1.2)

## 2014-02-19 ENCOUNTER — Other Ambulatory Visit: Payer: Self-pay

## 2014-02-19 DIAGNOSIS — R945 Abnormal results of liver function studies: Principal | ICD-10-CM

## 2014-02-19 DIAGNOSIS — R7989 Other specified abnormal findings of blood chemistry: Secondary | ICD-10-CM

## 2014-06-18 ENCOUNTER — Telehealth: Payer: Self-pay | Admitting: *Deleted

## 2014-06-18 NOTE — Telephone Encounter (Signed)
Patient's wife dropped off paper work for next office visit. She thought that he had appointment on 06/24/14, Richmond Campbell left a voice message letting them know that appointment is in October. I tried to call but was disconnected with Mrs. Pruden.

## 2014-06-24 ENCOUNTER — Ambulatory Visit: Payer: Medicare Other | Admitting: Nurse Practitioner

## 2014-07-23 ENCOUNTER — Encounter: Payer: Self-pay | Admitting: Adult Health

## 2014-07-23 ENCOUNTER — Ambulatory Visit (INDEPENDENT_AMBULATORY_CARE_PROVIDER_SITE_OTHER): Payer: Medicare Other | Admitting: Adult Health

## 2014-07-23 ENCOUNTER — Encounter (INDEPENDENT_AMBULATORY_CARE_PROVIDER_SITE_OTHER): Payer: Self-pay

## 2014-07-23 VITALS — BP 124/61 | HR 51 | Ht 70.0 in | Wt 173.0 lb

## 2014-07-23 DIAGNOSIS — R413 Other amnesia: Secondary | ICD-10-CM

## 2014-07-23 MED ORDER — DONEPEZIL HCL 5 MG PO TABS: 5.0000 mg | ORAL_TABLET | Freq: Every day | ORAL | Status: DC

## 2014-07-23 MED ORDER — MEMANTINE HCL 28 X 5 MG & 21 X 10 MG PO TABS: ORAL_TABLET | ORAL | Status: DC

## 2014-07-23 NOTE — Progress Notes (Signed)
I have read the note, and I agree with the clinical assessment and plan.  Cesar [name],Cesar [name]   

## 2014-07-23 NOTE — Progress Notes (Signed)
PATIENT: Cesar [name] DOB: 1936/09/11  REASON FOR VISIT: follow up HISTORY FROM: patient  HISTORY OF PRESENT ILLNESS: Cesar [name] is a 78 year old male with a history of mild memory disorder. He returns today for follow-up. He is currently taking Aricept 10 mg daily but has been only taking 3/4 of the tablet because it is hard on his stomach. He has had some ongoing issues with diarrhea on this medication. He states the lower dose does not cause the diarrhea. He states that his memory is getting worse. He states that he has the most trouble with instant recall. 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 He is able to complete all ADLs independently. He is able to operate a motor vehicle but has forgotten where he parked his car before. Wife completes the fiances at home and has been doing that. Wife gave me a letter that  detailed a beach trip in which the patient could not find the beach house they were staying at. He also become very agitated and frustrated that he could not identify the right house.   HISTORY 09/23/13 (CW): 78 year old right-handed white male with a history of a mild memory disorder. The patient initially was seen through this office on 11/05/2010. The patient at that time gave a 2 or 3 year history of mild memory issues. The patient was set up for MRI evaluation that showed minimal small vessel changes, and blood work was unremarkable. The patient never returned for followup. The patient apparently was placed on Namenda at some point, but he recently stopped the medication, and he was switched to Aricept. The patient is on 10 mg dosing at nighttime, and he has been on this for about one month. The patient is tolerating the medication relatively well, but he does have occasional diarrhea. The patient reports that his memory has changed slightly over time. The patient will have good days  and bad days with the memory. The patient is operating a motor vehicle without difficulty, and he carries a notepad around with him to take notes. The patient has not given up any activities of daily living secondary to his memory. His wife does help him with remembering appointments. The patient denies any focal numbness or weakness of the face, arms, or legs. The patient does have a mild balance issue. The patient indicates that he has some numbness and some discomfort involving the right leg, and prior lumbosacral spine surgery. The patient returns to this office for an evaluation. The patient does have some chronic neck discomfort, and some headaches coming up from the back of the neck.  REVIEW OF SYSTEMS: Full 14 system review of systems performed and notable only for:  Constitutional: Fatigue Eyes: Eye itching Ear/Nose/Throat: N/A  Skin: N/A  Cardiovascular: N/A  Respiratory: N/A  Gastrointestinal: Diarrhea Genitourinary: N/A Hematology/Lymphatic: N/A  Endocrine: N/A Musculoskeletal: Joint pain, back pain, muscle cramps, neck pain, neck stiffness  Allergy/Immunology: N/A  Neurological: Memory loss, dizziness, headache Psychiatric: Agitation, confusion Sleep: Restless leg, daytime sleepiness, snoring, acting out dreams   ALLERGIES: No Known Allergies  HOME MEDICATIONS: Outpatient Prescriptions Prior to Visit  Medication Sig Dispense Refill  . aspirin 81 MG tablet Take 81 mg by mouth daily.        Marland Kitchen atenolol (TENORMIN) 25 MG tablet Take 25 mg by mouth daily.        Marland Kitchen  donepezil (ARICEPT) 10 MG tablet Take 7.5 mg by mouth daily. PATIENT TAKING 7.$RemoveBeforeDE'5MG'GxiJBxRtGMliOdX$       . fenofibrate 160 MG tablet Take 160 mg by mouth daily.        . fluorouracil (EFUDEX) 5 % cream Apply topically as needed.      Marland Kitchen NITROSTAT 0.4 MG SL tablet DISSOLVE 1 TABLET UNDER THETONGUE AS NEEDED  25 tablet  1  . sertraline (ZOLOFT) 50 MG tablet Take 50 mg by mouth daily.        . Multiple Vitamin (MULTIVITAMIN) tablet Take 1  tablet by mouth daily.       No facility-administered medications prior to visit.    PAST MEDICAL HISTORY: Past Medical History  Diagnosis Date  . Depression   . Heart murmur     corrected per pt  . Hyperlipidemia   . Hypertension   . Autoimmune hepatitis   . Coronary atherosclerosis of unspecified type of vessel, native or graft   . Peripheral vascular disease, unspecified   . Occlusion and stenosis of carotid artery without mention of cerebral infarction   . Nonspecific elevation of levels of transaminase or lactic acid dehydrogenase (LDH)   . Unspecified hemorrhoids without mention of complication   . Other chronic nonalcoholic liver disease   . Osteoarthrosis, unspecified whether generalized or localized, unspecified site   . Other and unspecified hyperlipidemia   . AAA (abdominal aortic aneurysm)   . Cancer   . Memory deficits 09/23/2013  . Cervicogenic headache   . Hx of cardiovascular stress test     LexiScan Myoview (12/14):  No ischemia, EF 59%, normal study.    PAST SURGICAL HISTORY: Past Surgical History  Procedure Laterality Date  . Lumbar disc surgery    . Triple a indo vascular graft  8/09  . Polypectomy    . Appendectomy    . Tonsillectomy    . Skin cancer excision    . Abdominal aortic aneurysm repair    . Coronary artery bypass graft  2001    x4    FAMILY HISTORY: Family History  Problem Relation Age of Onset  . Colon cancer Neg Hx   . Colon polyps Father   . Heart disease Mother   . Hyperlipidemia Mother   . Heart attack Mother   . Multiple sclerosis Sister   . Diabetes Sister     SOCIAL HISTORY: History   Social History  . Marital Status: Married    Spouse Name: Glenda    Number of Children: 4  . Years of Education: DOCTORATE   Occupational History  . Retired     Theme park manager   Social History Main Topics  . Smoking status: Former Smoker    Quit date: 10/17/1968  . Smokeless tobacco: Never Used     Comment: quit 1970  . Alcohol Use:  No  . Drug Use: No  . Sexual Activity: Not on file   Other Topics Concern  . Not on file   Social History Narrative   Patient lives at home with wife [name].    Patient has 4 children.    Patient is retired.   Patient has a college education.       PHYSICAL EXAM  Filed Vitals:   07/23/14 0853  BP: 124/61  Pulse: 51  Height: $Remove'5\' 10"'wrfhbwu$  (1.778 m)  Weight: 173 lb (78.472 kg)   Body mass index is 24.82 kg/(m^2).  Generalized: Well developed, in no acute distress   Neurological examination  Mentation: Alert oriented  to time, place, history taking. Follows all commands speech and language fluent. MMSE 28/30 Cranial nerve II-XII: Pupils were equal round reactive to light. Extraocular movements were full, visual field were full on confrontational test. Facial sensation and strength were normal.  Uvula tongue midline. Head turning and shoulder shrug  were normal and symmetric. Motor: The motor testing reveals 5 over 5 strength of all 4 extremities. Good symmetric motor tone is noted throughout.  Sensory: Sensory testing is intact to soft touch on all 4 extremities. No evidence of extinction is noted.  Coordination: Cerebellar testing reveals good finger-nose-finger and heel-to-shin bilaterally.  Gait and station: Gait is normal. Tandem gait is normal. Romberg is negative. No drift is seen.  Reflexes: Deep tendon reflexes are symmetric and normal bilaterally.    DIAGNOSTIC DATA (LABS, IMAGING, TESTING) - I reviewed patient records, labs, notes, testing and imaging myself where available.  Lab Results  Component Value Date   WBC 10.8* 09/01/2010   HGB 11.4* 09/01/2010   HCT 34.3* 09/01/2010   MCV 92.5 09/01/2010   PLT 219 09/01/2010      Component Value Date/Time   NA 137 09/01/2010 0400   K 4.0 09/01/2010 0400   CL 109 09/01/2010 0400   CO2 24 09/01/2010 0400   GLUCOSE 110* 09/01/2010 0400   BUN 13 09/01/2010 0400   CREATININE 1.14 09/01/2010 0400   CALCIUM 8.8 09/01/2010  0400   PROT 7.6 02/18/2014 0737   ALBUMIN 3.8 02/18/2014 0737   AST 66* 02/18/2014 0737   ALT 38 02/18/2014 0737   ALKPHOS 43 02/18/2014 0737   BILITOT 0.6 02/18/2014 0737   GFRNONAA >60 09/01/2010 0400   GFRAA  Value: >60        The eGFR has been calculated using the MDRD equation. This calculation has not been validated in all clinical situations. eGFR's persistently <60 mL/min signify possible Chronic Kidney Disease. 09/01/2010 0400   Lab Results  Component Value Date   CHOL  Value: 142        ATP III CLASSIFICATION:  <200     mg/dL   Desirable  200-239  mg/dL   Borderline High  >=240    mg/dL   High 04/08/2008   HDL 42 04/08/2008   LDLCALC  Value: 81        Total Cholesterol/HDL:CHD Risk Coronary Heart Disease Risk Table                     Men   Women  1/2 Average Risk   3.4   3.3 04/08/2008   TRIG 95 04/08/2008   CHOLHDL 3.4 04/08/2008       ASSESSMENT AND PLAN 78 y.o. year old male  has a past medical history of Depression; Heart murmur; Hyperlipidemia; Hypertension; Autoimmune hepatitis; Coronary atherosclerosis of unspecified type of vessel, native or graft; Peripheral vascular disease, unspecified; Occlusion and stenosis of carotid artery without mention of cerebral infarction; Nonspecific elevation of levels of transaminase or lactic acid dehydrogenase (LDH); Unspecified hemorrhoids without mention of complication; Other chronic nonalcoholic liver disease; Osteoarthrosis, unspecified whether generalized or localized, unspecified site; Other and unspecified hyperlipidemia; AAA (abdominal aortic aneurysm); Cancer; Memory deficits (09/23/2013); Cervicogenic headache; and cardiovascular stress test. here with:  1. mild memory loss  Patient's memory has remained stable over the past year. His MMSE score is 28/30. His previous score was 28/30 as well. Patient does feel that his memory is getting worse, he notices it more with instant recall of conversations. Patient is  currently taking Aricept but is  having some ongoing diarrhea with this medication. I will decrease the dose to 5 mg and start him on Namenda. I will also send the patient for neuropsychological testing for evaluation of mild memory loss. If symptoms worsen or he develops new symptoms he should call her office. Otherwise he will followup in 6 months or sooner if needed.  Ward Givens, MSN, NP-C 07/23/2014, 9:14 AM [facility] [address], [place], [place] [id number] [phone]  Note: This document was prepared with digital dictation and possible smart phrase technology. Any transcriptional errors that result from this process are unintentional.

## 2014-07-23 NOTE — Patient Instructions (Signed)

## 2014-08-18 ENCOUNTER — Telehealth: Payer: Self-pay

## 2014-08-18 NOTE — Telephone Encounter (Signed)
Pt aware.

## 2014-08-18 NOTE — Telephone Encounter (Signed)
-----   Message from Maury Dus, RN sent at 02/19/2014  8:21 AM EDT ----- Regarding: LFT's Pt needs LFT's, order in.

## 2014-08-20 ENCOUNTER — Other Ambulatory Visit (INDEPENDENT_AMBULATORY_CARE_PROVIDER_SITE_OTHER): Payer: Medicare Other

## 2014-08-20 ENCOUNTER — Other Ambulatory Visit: Payer: Self-pay | Admitting: Adult Health

## 2014-08-20 DIAGNOSIS — R7989 Other specified abnormal findings of blood chemistry: Secondary | ICD-10-CM

## 2014-08-20 DIAGNOSIS — R945 Abnormal results of liver function studies: Principal | ICD-10-CM

## 2014-08-20 LAB — HEPATIC FUNCTION PANEL
ALT: 28 U/L (ref 0–53)
AST: 55 U/L — ABNORMAL HIGH (ref 0–37)
Albumin: 3.2 g/dL — ABNORMAL LOW (ref 3.5–5.2)
Alkaline Phosphatase: 39 U/L (ref 39–117)
BILIRUBIN DIRECT: 0.1 mg/dL (ref 0.0–0.3)
Total Bilirubin: 0.5 mg/dL (ref 0.2–1.2)
Total Protein: 7.6 g/dL (ref 6.0–8.3)

## 2014-08-20 MED ORDER — MEMANTINE HCL 10 MG PO TABS: 10.0000 mg | ORAL_TABLET | Freq: Two times a day (BID) | ORAL | Status: AC

## 2014-08-21 ENCOUNTER — Other Ambulatory Visit: Payer: Self-pay

## 2014-08-21 DIAGNOSIS — R945 Abnormal results of liver function studies: Principal | ICD-10-CM

## 2014-08-21 DIAGNOSIS — R7989 Other specified abnormal findings of blood chemistry: Secondary | ICD-10-CM

## 2014-09-03 ENCOUNTER — Encounter: Payer: Self-pay | Admitting: Neurology

## 2014-09-09 ENCOUNTER — Encounter: Payer: Self-pay | Admitting: Neurology

## 2014-10-14 ENCOUNTER — Encounter: Payer: Self-pay | Admitting: Family

## 2014-10-21 ENCOUNTER — Encounter: Payer: Self-pay | Admitting: Family

## 2014-10-21 ENCOUNTER — Ambulatory Visit (HOSPITAL_COMMUNITY)
Admission: RE | Admit: 2014-10-21 | Discharge: 2014-10-21 | Disposition: A | Discharge status: Home / Self Care | Payer: Medicare Other | Source: Ambulatory Visit | Attending: Family | Admitting: Family

## 2014-10-21 ENCOUNTER — Ambulatory Visit (INDEPENDENT_AMBULATORY_CARE_PROVIDER_SITE_OTHER): Payer: Medicare Other | Admitting: Family

## 2014-10-21 VITALS — BP 122/74 | HR 48 | Resp 14 | Ht 71.0 in | Wt 175.0 lb

## 2014-10-21 DIAGNOSIS — Z48812 Encounter for surgical aftercare following surgery on the circulatory system: Secondary | ICD-10-CM | POA: Diagnosis not present

## 2014-10-21 DIAGNOSIS — I714 Abdominal aortic aneurysm, without rupture, unspecified: Secondary | ICD-10-CM

## 2014-10-21 DIAGNOSIS — Z95828 Presence of other vascular implants and grafts: Secondary | ICD-10-CM

## 2014-10-21 NOTE — Addendum Note (Signed)
Addended by: Mena Goes on: 10/21/2014 04:04 PM   Modules accepted: Orders

## 2014-10-21 NOTE — Patient Instructions (Signed)
Abdominal Aortic Aneurysm An aneurysm is a weakened or damaged part of an artery wall that bulges from the normal force of blood pumping through the body. An abdominal aortic aneurysm is an aneurysm that occurs in the lower part of the aorta, the main artery of the body.  The major concern with an abdominal aortic aneurysm is that it can enlarge and burst (rupture) or blood can flow between the layers of the wall of the aorta through a tear (aorticdissection). Both of these conditions can cause bleeding inside the body and can be life threatening unless diagnosed and treated promptly. CAUSES  The exact cause of an abdominal aortic aneurysm is unknown. Some contributing factors are:   A hardening of the arteries caused by the buildup of fat and other substances in the lining of a blood vessel (arteriosclerosis).  Inflammation of the walls of an artery (arteritis).   Connective tissue diseases, such as Marfan syndrome.   Abdominal trauma.   An infection, such as syphilis or staphylococcus, in the wall of the aorta (infectious aortitis) caused by bacteria. RISK FACTORS  Risk factors that contribute to an abdominal aortic aneurysm may include:  Age older than 60 years.   High blood pressure (hypertension).  Male gender.  Ethnicity (white race).  Obesity.  Family history of aneurysm (first degree relatives only).  Tobacco use. PREVENTION  The following healthy lifestyle habits may help decrease your risk of abdominal aortic aneurysm:  Quitting smoking. Smoking can raise your blood pressure and cause arteriosclerosis.  Limiting or avoiding alcohol.  Keeping your blood pressure, blood sugar level, and cholesterol levels within normal limits.  Decreasing your salt intake. In somepeople, too much salt can raise blood pressure and increase your risk of abdominal aortic aneurysm.  Eating a diet low in saturated fats and cholesterol.  Increasing your fiber intake by including  whole grains, vegetables, and fruits in your diet. Eating these foods may help lower blood pressure.  Maintaining a healthy weight.  Staying physically active and exercising regularly. SYMPTOMS  The symptoms of abdominal aortic aneurysm may vary depending on the size and rate of growth of the aneurysm.Most grow slowly and do not have any symptoms. When symptoms do occur, they may include:  Pain (abdomen, side, lower back, or groin). The pain may vary in intensity. A sudden onset of severe pain may indicate that the aneurysm has ruptured.  Feeling full after eating only small amounts of food.  Nausea or vomiting or both.  Feeling a pulsating lump in the abdomen.  Feeling faint or passing out. DIAGNOSIS  Since most unruptured abdominal aortic aneurysms have no symptoms, they are often discovered during diagnostic exams for other conditions. An aneurysm may be found during the following procedures:  Ultrasonography (A one-time screening for abdominal aortic aneurysm by ultrasonography is also recommended for all men aged 65-75 years who have ever smoked).  X-ray exams.  A computed tomography (CT).  Magnetic resonance imaging (MRI).  Angiography or arteriography. TREATMENT  Treatment of an abdominal aortic aneurysm depends on the size of your aneurysm, your age, and risk factors for rupture. Medication to control blood pressure and pain may be used to manage aneurysms smaller than 6 cm. Regular monitoring for enlargement may be recommended by your caregiver if:  The aneurysm is 3-4 cm in size (an annual ultrasonography may be recommended).  The aneurysm is 4-4.5 cm in size (an ultrasonography every 6 months may be recommended).  The aneurysm is larger than 4.5 cm in   size (your caregiver may ask that you be examined by a vascular surgeon). If your aneurysm is larger than 6 cm, surgical repair may be recommended. There are two main methods for repair of an aneurysm:   Endovascular  repair (a minimally invasive surgery). This is done most often.  Open repair. This method is used if an endovascular repair is not possible. Document Released: 07/13/2005 Document Revised: 01/28/2013 Document Reviewed: 11/02/2012 ExitCare Patient Information 2015 [employer]. This information is not intended to replace advice given to you by your health care provider. Make sure you discuss any questions you have with your health care provider.  

## 2014-10-21 NOTE — Progress Notes (Signed)
[facility] [place]  Established EVAR  History of Present Illness  Cesar [name] is a 79 y.o. ([birth year]) male patient of Dr. Scot [name] who is s/p EVAR in November 2011. He returns today for routine post EVAR surveillance.  Carotid Duplex result from January 2015 shows bilateral internal carotid artery velocities suggest <40% stenosis. He denies ever having stroke or TIA symptoms, denies claudication with walking, denies non-healing wounds, denies new medical problems or newsurgical hx since last seen. He has numbness in the lateral aspect of right foot, this has been evaluated, unknown etiology; it is noted that he has a history of lumbar disc surgery. He denies abdominal or back pain. He denies ever having an MI, had 4 vessel CABG in 2001.  Pt Diabetic: No Pt smoker: former smoker, quit in 2000  Pt meds include: Statin :No ASA: Yes Other anticoagulants/antiplatelets: no   Past Medical History  Diagnosis Date  . Depression   . Heart murmur     corrected per pt  . Hyperlipidemia   . Hypertension   . Autoimmune hepatitis   . Coronary atherosclerosis of unspecified type of vessel, native or graft   . Peripheral vascular disease, unspecified   . Occlusion and stenosis of carotid artery without mention of cerebral infarction   . Nonspecific elevation of levels of transaminase or lactic acid dehydrogenase (LDH)   . Unspecified hemorrhoids without mention of complication   . Other chronic nonalcoholic liver disease   . Osteoarthrosis, unspecified whether generalized or localized, unspecified site   . Other and unspecified hyperlipidemia   . AAA (abdominal aortic aneurysm)   . Cancer   . Memory deficits 09/23/2013  . Cervicogenic headache   . Hx of cardiovascular stress test     LexiScan Myoview (12/14):  No ischemia, EF 59%, normal study.   Past Surgical History  Procedure Laterality Date  . Lumbar disc surgery    . Triple a indo vascular graft   8/09  . Polypectomy    . Appendectomy    . Tonsillectomy    . Skin cancer excision    . Abdominal aortic aneurysm repair    . Coronary artery bypass graft  2001    x4  . Spine surgery     Social History History  Substance Use Topics  . Smoking status: Former Smoker    Quit date: 10/17/1968  . Smokeless tobacco: Never Used     Comment: quit 1970  . Alcohol Use: No   Family History Family History  Problem Relation Age of Onset  . Colon cancer Neg Hx   . Colon polyps Father   . Heart disease Mother     After age 51  . Hyperlipidemia Mother   . Heart attack Mother   . Multiple sclerosis Sister   . Diabetes Sister    Current Outpatient Prescriptions on File Prior to Visit  Medication Sig Dispense Refill  . aspirin 81 MG tablet Take 81 mg by mouth daily.      Marland Kitchen atenolol (TENORMIN) 25 MG tablet Take 25 mg by mouth daily.      Marland Kitchen donepezil (ARICEPT) 5 MG tablet TAKE 1 TABLET (5 MG TOTAL) BY MOUTH AT BEDTIME. 30 tablet 3  . fenofibrate 160 MG tablet Take 160 mg by mouth daily.      . fluorouracil (EFUDEX) 5 % cream Apply topically as needed.    . memantine (NAMENDA) 10 MG tablet Take 1 tablet (10 mg total) by mouth 2 (two) times  daily. 60 tablet 3  . NITROSTAT 0.4 MG SL tablet DISSOLVE 1 TABLET UNDER THETONGUE AS NEEDED 25 tablet 1  . sertraline (ZOLOFT) 50 MG tablet Take 50 mg by mouth daily.      . memantine (NAMENDA TITRATION PAK) tablet pack 5 mg/day for =1 week; 5 mg twice daily for =1 week; 15 mg/day given in 5 mg and 10 mg separated doses for =1 week; then 10 mg twice daily (Patient not taking: Reported on 10/21/2014) 49 tablet 0   No current facility-administered medications on file prior to visit.   No Known Allergies   ROS: See HPI for pertinent positives and negatives.  Physical Examination  Filed Vitals:   10/21/14 0916  BP: 122/74  Pulse: 48  Resp: 14  Height: 5\' 11"  (1.803 m)  Weight: 175 lb (79.379 kg)  SpO2: 97%   Body mass index is 24.42  kg/(m^2).  General: WDWN in NAD Gait: Normal HENT: WNL Eyes: Pupils equal Pulmonary: normal non-labored breathing , without Rales, rhonchi,or  wheezing Cardiac: RRR, without detected murmur.  Abdomen: soft, NT, no palpable masses Skin: no rashes or ulcers; no gangrene , no cellulitis; no open wounds.  VASCULAR EXAM  Carotid Bruits Left Right   Negative positive   Aorta is slightly palpable Radial pulses are 2+ and =   VASCULAR EXAM: Extremities without ischemic changes  without Gangrene; without open wounds. Feet are warm, pink, well perfused.     LE Pulses LEFT RIGHT   FEMORAL 2+ palpable 2+ palpable    POPLITEAL not palpable  not palpable   POSTERIOR TIBIAL not palpable  not palpable    DORSALIS PEDIS  ANTERIOR TIBIAL not palpable  not palpable    Musculoskeletal: no muscle wasting or atrophy; no peripheral edema Neurologic: A&O X 3; Appropriate Affect ;  SENSATION: normal; MOTOR FUNCTION: 5/5 Symmetric, CN 2-12 intact Speech is fluent/normal    Non-Invasive Vascular Imaging  EVAR Duplex (Date: 10/21/2014)  ABDOMINAL AORTA DUPLEX EVALUATION - POST ENDOVASCULAR REPAIR    INDICATION: Follow up aneurysm repair    PREVIOUS INTERVENTION(S): EVAR 08/31/2010    DUPLEX EXAM:      DIAMETER AP (cm) DIAMETER TRANSVERSE (cm) VELOCITIES (cm/sec)  Aorta 3.5 3.7 74  Right Common Iliac 1.4 1.3 94  Left Common Iliac 1.4 - 33    Comparison Study       Date DIAMETER AP (cm) DIAMETER TRANSVERSE (cm)  10/23/2013 4.1 4.1     ADDITIONAL FINDINGS:     IMPRESSION: 1. Technically difficult exam due to gas. 2. 3.5 x 3.7 cm sac size    Compared to the previous exam:  Smaller in diameter in comparison to last exam      Medical Decision  Making  Cesar [name] is a 79 y.o. male who presents s/p EVAR (Date: November 2011).  Pt is asymptomatic with decresed sac size.  Carotid Duplex result from January 2015 shows bilateral internal carotid artery velocities suggest <40% stenosis. He has no history of stroke or TIA, but now seems to have a right carotid bruit. Will recheck carotid Duplex on his return in a year   I discussed with the patient the importance of surveillance of the endograft.  The next endograft duplex will be scheduled for 12 months. Also carotid Duplex.  The patient will follow up with [place] in 12 months with these studies.  I emphasized the importance of maximal medical management including strict control of blood pressure, blood glucose, and lipid levels, antiplatelet agents,  obtaining regular exercise, and cessation of smoking.   The patient was given information about AAA including signs, symptoms, treatment, and how to minimize the risk of enlargement and rupture of aneurysms.    Thank you for allowing [place] to participate in this patient's care.  Clemon Chambers, RN, MSN, FNP-C [facility] [place] Office: [phone]  Clinic Physician: Early  10/21/2014, 9:17 AM

## 2014-10-23 NOTE — Addendum Note (Signed)
Addended by: Mena Goes on: 10/23/2014 09:21 AM   Modules accepted: Orders

## 2014-10-27 ENCOUNTER — Other Ambulatory Visit (HOSPITAL_COMMUNITY): Payer: Medicare Other

## 2014-10-27 ENCOUNTER — Ambulatory Visit: Payer: Medicare Other | Admitting: Family

## 2014-11-05 ENCOUNTER — Ambulatory Visit: Payer: Medicare Other | Admitting: Vascular Surgery

## 2014-11-20 DIAGNOSIS — R413 Other amnesia: Secondary | ICD-10-CM

## 2014-12-03 ENCOUNTER — Telehealth: Payer: Self-pay | Admitting: Adult Health

## 2014-12-03 NOTE — Telephone Encounter (Signed)
Patient had neuropsychological testing completed. The results show a mild cognitive impairment. The results were reviewed with the patient on 11/28/2014 by Dr. Valentina Shaggy. The patient has a revisit in April.

## 2015-01-05 ENCOUNTER — Telehealth: Payer: Self-pay | Admitting: Cardiology

## 2015-01-05 NOTE — Telephone Encounter (Signed)
New Message  Pt c/o of Chest Pain: STAT if CP now or developed within 24 hours  1. Are you having CP right now? No- only discomfort    2. Are you experiencing any other symptoms (ex. SOB, nausea, vomiting, sweating)? No, but fell twice last week (balance issues)  3. How long have you been experiencing CP? Does not know exactly, has been experiencing discomfort     4. Is your CP continuous or coming and going? Comes and goes  5. Have you taken Nitroglycerin? no ?

## 2015-01-05 NOTE — Telephone Encounter (Signed)
Returned call to patient he stated he has been having chest discomfort off and on this past Saturday and Sunday.Stated chest discomfort worse yesterday.Stated reminded him how he felt when he had first heart surgery.Stated discomfort was in center of his chest.No chest pain today feels good.Stated wanted to see Dr.Hochrein only.Appointment scheduled with Dr.Hochrein 01/22/15 at 10:45 am.Advised if he has any more chest discomfort he will need to go to ER.

## 2015-01-22 ENCOUNTER — Ambulatory Visit (INDEPENDENT_AMBULATORY_CARE_PROVIDER_SITE_OTHER): Payer: Medicare Other | Admitting: Cardiology

## 2015-01-22 ENCOUNTER — Ambulatory Visit (INDEPENDENT_AMBULATORY_CARE_PROVIDER_SITE_OTHER): Payer: Medicare Other | Admitting: Adult Health

## 2015-01-22 ENCOUNTER — Encounter: Payer: Self-pay | Admitting: Adult Health

## 2015-01-22 ENCOUNTER — Ambulatory Visit
Admission: RE | Admit: 2015-01-22 | Discharge: 2015-01-22 | Disposition: A | Discharge status: Home / Self Care | Payer: Medicare Other | Source: Ambulatory Visit | Attending: Cardiology | Admitting: Cardiology

## 2015-01-22 ENCOUNTER — Encounter: Payer: Self-pay | Admitting: Cardiology

## 2015-01-22 VITALS — BP 127/55 | HR 49 | Ht 71.0 in | Wt 177.0 lb

## 2015-01-22 VITALS — BP 100/62 | HR 44 | Ht 71.0 in | Wt 176.8 lb

## 2015-01-22 DIAGNOSIS — R079 Chest pain, unspecified: Secondary | ICD-10-CM

## 2015-01-22 DIAGNOSIS — R413 Other amnesia: Secondary | ICD-10-CM

## 2015-01-22 DIAGNOSIS — R0989 Other specified symptoms and signs involving the circulatory and respiratory systems: Secondary | ICD-10-CM

## 2015-01-22 NOTE — Progress Notes (Signed)
I have read the note, and I agree with the clinical assessment and plan.  Cesar [name],Cesar [name]   

## 2015-01-22 NOTE — Patient Instructions (Signed)
Continue Aricept and namenda If symptoms worsen please let us know.

## 2015-01-22 NOTE — Progress Notes (Signed)
HPI The patient presents for follow up of CAD.  He now has 79 year old bypass grafts. 2 weeks ago he had an episode of chest discomfort. This occurred at rest. It was midsternal. It was 3 out of 10 in intensity. It was like his previous angina. It came on during rest and went away spontaneously after about 15 minutes. He did not have associated symptoms. He's been out beginning in the garden since then without bringing on this discomfort. He denies any shortness of breath, PND or orthopnea. Palpitations, presyncope or syncope. He's had no weight gain or edema.   No Known Allergies  Current Outpatient Prescriptions  Medication Sig Dispense Refill  . aspirin 81 MG tablet Take 81 mg by mouth daily.      Marland Kitchen atenolol (TENORMIN) 25 MG tablet Take 25 mg by mouth daily.      Marland Kitchen donepezil (ARICEPT) 5 MG tablet TAKE 1 TABLET (5 MG TOTAL) BY MOUTH AT BEDTIME. 30 tablet 3  . fenofibrate 160 MG tablet Take 160 mg by mouth daily.      . fluorouracil (EFUDEX) 5 % cream Apply topically as needed.    . memantine (NAMENDA) 10 MG tablet Take 1 tablet (10 mg total) by mouth 2 (two) times daily. 60 tablet 3  . NITROSTAT 0.4 MG SL tablet DISSOLVE 1 TABLET UNDER THETONGUE AS NEEDED 25 tablet 1  . sertraline (ZOLOFT) 50 MG tablet Take 50 mg by mouth daily.       No current facility-administered medications for this visit.    Past Medical History  Diagnosis Date  . Depression   . Heart murmur     corrected per pt  . Hyperlipidemia   . Hypertension   . Autoimmune hepatitis   . Coronary atherosclerosis of unspecified type of vessel, native or graft   . Peripheral vascular disease, unspecified   . Occlusion and stenosis of carotid artery without mention of cerebral infarction   . Nonspecific elevation of levels of transaminase or lactic acid dehydrogenase (LDH)   . Unspecified hemorrhoids without mention of complication   . Other chronic nonalcoholic liver disease   . Osteoarthrosis, unspecified whether  generalized or localized, unspecified site   . Other and unspecified hyperlipidemia   . AAA (abdominal aortic aneurysm)   . Cancer   . Memory deficits 09/23/2013  . Cervicogenic headache   . Hx of cardiovascular stress test     LexiScan Myoview (12/14):  No ischemia, EF 59%, normal study.    Past Surgical History  Procedure Laterality Date  . Lumbar disc surgery    . Triple a indo vascular graft  8/09  . Polypectomy    . Appendectomy    . Tonsillectomy    . Skin cancer excision    . Abdominal aortic aneurysm repair    . Coronary artery bypass graft  2001    x4  . Spine surgery      ROS:  Recent cold like symptoms.  As stated in the HPI and negative for all other systems.  PHYSICAL EXAM BP 100/62 mmHg  Pulse 44  Ht 5\' 11"  (1.803 m)  Wt 176 lb 12.8 oz (80.196 kg)  BMI 24.67 kg/m2 GENERAL:  Well appearing NECK:  No jugular venous distention, waveform within normal limits, carotid upstroke brisk and symmetric, bilateral bruits, no thyromegaly LYMPHATICS:  No cervical, inguinal adenopathy LUNGS:  Fine crackles diffusely BACK:  No CVA tenderness CHEST: Well healed sternotomy scar. HEART:  PMI not displaced or sustained,S1 and S2  within normal limits, no S3, no S4, no clicks, no rubs, no murmurs ABD:  Flat, positive bowel sounds normal in frequency in pitch, positive abdominal bruits, no rebound, no guarding, no midline pulsatile mass, midline bruit, no hepatomegaly, no splenomegaly EXT:  2 plus pulses throughout, no edema, no cyanosis no clubbing  EKG:   Sinus bradycardia, rate 44 , axis within normal limits, premature ectopic atrial complexes , no acute ST-T wave. 01/22/2015  ASSESSMENT AND PLAN  CAD -   Given the episode of chest discomfort he needs stress testing. He's unable to get his heart rate up with a treadmill test. I will order a Lexiscan Myoview.  ABNORMAL LUNG EXAM -  I heard diffuse fine crackles and I will check a chest x-ray.  CAROTID STENOSIS -   This is  followed by Dr. Scot Dock  HYPERTENSION NEC - The blood pressure is at target. No change in medications is indicated. We will continue with therapeutic lifestyle changes (TLC).  DYSLIPIDEMIA -  This is followed by Precious Reel, MD.  He has not been on statins because of its liver issues.
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
## 2015-01-22 NOTE — Patient Instructions (Addendum)
Your physician wants you to follow-up in: SIX months. You will receive a reminder letter in the mail two months in advance. If you don't receive a letter, please call our office to schedule the follow-up appointment.  .Dr.Hochrein has ordered for you to have a Chest X-Ray. This will be done at [facility] at [address]  Your physician has requested that you have a lexiscan myoview. For further information please visit [email or web address]. Please follow instruction sheet, as given.

## 2015-01-23 ENCOUNTER — Encounter: Payer: Self-pay | Admitting: Cardiology

## 2015-01-27 ENCOUNTER — Ambulatory Visit (HOSPITAL_COMMUNITY)
Admission: RE | Admit: 2015-01-27 | Discharge: 2015-01-27 | Disposition: A | Discharge status: Home / Self Care | Payer: Medicare Other | Source: Ambulatory Visit | Attending: Cardiovascular Disease | Admitting: Cardiovascular Disease

## 2015-01-27 DIAGNOSIS — I739 Peripheral vascular disease, unspecified: Secondary | ICD-10-CM | POA: Diagnosis not present

## 2015-01-27 DIAGNOSIS — I10 Essential (primary) hypertension: Secondary | ICD-10-CM | POA: Diagnosis not present

## 2015-01-27 DIAGNOSIS — R42 Dizziness and giddiness: Secondary | ICD-10-CM | POA: Insufficient documentation

## 2015-01-27 DIAGNOSIS — I252 Old myocardial infarction: Secondary | ICD-10-CM | POA: Diagnosis not present

## 2015-01-27 DIAGNOSIS — Z87891 Personal history of nicotine dependence: Secondary | ICD-10-CM | POA: Insufficient documentation

## 2015-01-27 DIAGNOSIS — R001 Bradycardia, unspecified: Secondary | ICD-10-CM | POA: Insufficient documentation

## 2015-01-27 DIAGNOSIS — Z951 Presence of aortocoronary bypass graft: Secondary | ICD-10-CM | POA: Diagnosis not present

## 2015-01-27 DIAGNOSIS — I2510 Atherosclerotic heart disease of native coronary artery without angina pectoris: Secondary | ICD-10-CM | POA: Insufficient documentation

## 2015-01-27 DIAGNOSIS — R079 Chest pain, unspecified: Secondary | ICD-10-CM

## 2015-01-27 DIAGNOSIS — Z8249 Family history of ischemic heart disease and other diseases of the circulatory system: Secondary | ICD-10-CM | POA: Insufficient documentation

## 2015-01-27 MED ORDER — TECHNETIUM TC 99M SESTAMIBI GENERIC - CARDIOLITE
31.0000 | Freq: Once | INTRAVENOUS | Status: AC | PRN
  Administered 2015-01-27: 31 via INTRAVENOUS

## 2015-01-27 MED ORDER — TECHNETIUM TC 99M SESTAMIBI GENERIC - CARDIOLITE
10.1000 | Freq: Once | INTRAVENOUS | Status: AC | PRN
  Administered 2015-01-27: 10.1 via INTRAVENOUS

## 2015-01-27 MED ORDER — REGADENOSON 0.4 MG/5ML IV SOLN
0.4000 mg | Freq: Once | INTRAVENOUS | Status: AC
  Administered 2015-01-27: 0.4 mg via INTRAVENOUS

## 2015-01-27 NOTE — Procedures (Addendum)
[facility] [address] [place] [address] [phone]  Cardiology Nuclear Med Study  Cesar [name] is a 79 y.o. male     MRN : [id number]     DOB: [birth year]  Procedure Date: 01/27/2015  Nuclear Med Background Indication for Stress Test:  Follow up CAD History:  CAD;CABG X4;MI;Last NUC MPI on 10/03/2003-normal;EF=59%; Cardiac Risk Factors: Carotid Disease, Family History - CAD, History of Smoking, Hypertension, Lipids and PVD  Symptoms:  Chest Pain, Dizziness and Light-Headedness   Nuclear Pre-Procedure Caffeine/Decaff Intake:  7:00pm NPO After: 5:00am   IV Site: R Forearm  IV 0.9% NS with Angio Cath:  22g  Chest Size (in):  44" IV Started by: Rolene Course, RN  Height: 5\' 11"  (1.803 m)  Cup Size: n/a  BMI:  Body mass index is 24.56 kg/(m^2). Weight:  176 lb (79.833 kg)   Tech Comments:  n/a    Nuclear Med Study 1 or 2 day study: 1 day  Stress Test Type:  [place] Provider:  Minus Breeding, MD   Resting Radionuclide: Technetium 67m Sestamibi  Resting Radionuclide Dose: 10.1 mCi   Stress Radionuclide:  Technetium 68m Sestamibi  Stress Radionuclide Dose: 31.0 mCi           Stress Protocol Rest HR: 49 Stress HR: 50  Rest BP: 153/66 Stress BP: 166/60  Exercise Time (min): n/a METS: n/a          Dose of Adenosine (mg):  n/a Dose of Lexiscan: 0.4 mg  Dose of Atropine (mg): n/a Dose of Dobutamine: n/a mcg/kg/min (at max HR)  Stress Test Technologist: Leane Para, CCT Nuclear Technologist: Imagene Riches, CNMT   Rest Procedure:  Myocardial perfusion imaging was performed at rest 45 minutes following the intravenous administration of Technetium 54m Sestamibi. Stress Procedure:  The patient received IV Lexiscan 0.4 mg over 15-seconds.  Technetium 67m Sestamibi injected IV at 30-seconds.  There were no significant changes with Lexiscan.  Quantitative spect images were obtained after a 45  minute delay.  Transient Ischemic Dilatation (Normal <1.22):  1.06  QGS EDV:  110 ml QGS ESV:  47 ml LV Ejection Fraction: 57%       Rest ECG: Sinus Bradycardia at 44  Stress ECG: No significant change from baseline ECG  QPS Raw Data Images:  Normal; no motion artifact; normal heart/lung ratio. Stress Images:  Normal homogeneous uptake in all areas of the myocardium. Rest Images:  Normal homogeneous uptake in all areas of the myocardium. Subtraction (SDS):  Normal  Impression Exercise Capacity:  Lexiscan with no exercise. BP Response:  Normal blood pressure response. Clinical Symptoms:  No symptoms. ECG Impression:  No significant ST segment change suggestive of ischemia. Comparison with Prior Nuclear Study: No significant change from previous study  Overall Impression:  Normal stress nuclear study.  LV Wall Motion:  NL LV Function, EF 57%; NL Wall Motion   Shelva Majestic A, MD  01/27/2015 12:36 PM

## 2015-02-16 ENCOUNTER — Telehealth: Payer: Self-pay

## 2015-02-16 NOTE — Telephone Encounter (Signed)
-----   Message from Maury Dus, RN sent at 08/21/2014  2:20 PM EST ----- Regarding: LFT's Pt needs to have LFT's drawn in 45mth in May, order in.

## 2015-02-16 NOTE — Telephone Encounter (Signed)
Pt aware and state he will come for labs.

## 2015-02-17 ENCOUNTER — Other Ambulatory Visit (INDEPENDENT_AMBULATORY_CARE_PROVIDER_SITE_OTHER): Payer: Medicare Other

## 2015-02-17 ENCOUNTER — Other Ambulatory Visit: Payer: Self-pay

## 2015-02-17 DIAGNOSIS — R7989 Other specified abnormal findings of blood chemistry: Secondary | ICD-10-CM

## 2015-02-17 DIAGNOSIS — R945 Abnormal results of liver function studies: Principal | ICD-10-CM

## 2015-02-17 LAB — HEPATIC FUNCTION PANEL
ALT: 24 U/L (ref 0–53)
AST: 47 U/L — ABNORMAL HIGH (ref 0–37)
Albumin: 3.6 g/dL (ref 3.5–5.2)
Alkaline Phosphatase: 46 U/L (ref 39–117)
Bilirubin, Direct: 0.1 mg/dL (ref 0.0–0.3)
Total Bilirubin: 0.5 mg/dL (ref 0.2–1.2)
Total Protein: 7.7 g/dL (ref 6.0–8.3)

## 2015-02-25 ENCOUNTER — Encounter: Payer: Self-pay | Admitting: Internal Medicine

## 2015-07-23 ENCOUNTER — Telehealth: Payer: Self-pay

## 2015-07-23 ENCOUNTER — Encounter: Payer: Self-pay | Admitting: Adult Health

## 2015-07-23 ENCOUNTER — Ambulatory Visit (INDEPENDENT_AMBULATORY_CARE_PROVIDER_SITE_OTHER): Payer: Medicare Other | Admitting: Adult Health

## 2015-07-23 VITALS — BP 152/77 | HR 49 | Resp 20 | Ht 70.5 in | Wt 174.0 lb

## 2015-07-23 DIAGNOSIS — R413 Other amnesia: Secondary | ICD-10-CM
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
## 2015-07-23 NOTE — Progress Notes (Signed)
I have read the note, and I agree with the clinical assessment and plan.  Delene Morais KEITH   

## 2015-07-23 NOTE — Telephone Encounter (Signed)
I spoke to the patient in regards to the CREAD study. The patient requested more information, so we will mail the Informed Consent Form.

## 2015-07-23 NOTE — Patient Instructions (Signed)
Continue with Aricept and Namenda We will continue to monitor memory I will refer you for research trial. If your symptoms worsen or you develop new symptoms please let us know.

## 2015-07-24 ENCOUNTER — Ambulatory Visit: Payer: Medicare Other | Admitting: Adult Health

## 2015-07-30 ENCOUNTER — Telehealth: Payer: Self-pay

## 2015-07-30 NOTE — Telephone Encounter (Signed)
I spoke to the patient in re the CREAD study, and the patient is interested in participating. Therefore, an FCSRT visit was scheduled for [id number] at 14:00h at [facility].

## 2015-08-03 ENCOUNTER — Ambulatory Visit (INDEPENDENT_AMBULATORY_CARE_PROVIDER_SITE_OTHER): Payer: Medicare Other | Admitting: Cardiology

## 2015-08-03 ENCOUNTER — Encounter: Payer: Self-pay | Admitting: Cardiology

## 2015-08-03 VITALS — BP 130/64 | HR 62 | Ht 70.0 in | Wt 176.5 lb

## 2015-08-03 DIAGNOSIS — I2510 Atherosclerotic heart disease of native coronary artery without angina pectoris: Secondary | ICD-10-CM

## 2015-08-03 NOTE — Patient Instructions (Signed)
Your physician wants you to follow-up in: 1 Year. You will receive a reminder letter in the mail two months in advance. If you don't receive a letter, please call our office to schedule the follow-up appointment.  
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
## 2015-08-06 ENCOUNTER — Encounter (INDEPENDENT_AMBULATORY_CARE_PROVIDER_SITE_OTHER): Payer: Self-pay

## 2015-08-06 DIAGNOSIS — Z0289 Encounter for other administrative examinations: Secondary | ICD-10-CM

## 2015-08-19 ENCOUNTER — Telehealth: Payer: Self-pay

## 2015-08-19 ENCOUNTER — Other Ambulatory Visit (INDEPENDENT_AMBULATORY_CARE_PROVIDER_SITE_OTHER): Payer: Medicare Other

## 2015-08-19 DIAGNOSIS — R7989 Other specified abnormal findings of blood chemistry: Secondary | ICD-10-CM | POA: Diagnosis not present

## 2015-08-19 DIAGNOSIS — R945 Abnormal results of liver function studies: Principal | ICD-10-CM

## 2015-08-19 LAB — HEPATIC FUNCTION PANEL
ALT: 22 U/L (ref 0–53)
AST: 42 U/L — ABNORMAL HIGH (ref 0–37)
Albumin: 3.4 g/dL — ABNORMAL LOW (ref 3.5–5.2)
Alkaline Phosphatase: 44 U/L (ref 39–117)
BILIRUBIN DIRECT: 0.1 mg/dL (ref 0.0–0.3)
Total Bilirubin: 0.4 mg/dL (ref 0.2–1.2)
Total Protein: 7.6 g/dL (ref 6.0–8.3)

## 2015-08-19 NOTE — Telephone Encounter (Signed)
Please let the patient know that his liver tests are stable. Repeat LFTs in 6 months

## 2015-08-19 NOTE — Telephone Encounter (Signed)
-----   Message from Algernon Huxley, RN sent at 02/17/2015  4:16 PM EDT ----- Regarding: Labs Needs LFTS in Nov, order in

## 2015-08-19 NOTE — Telephone Encounter (Signed)
Left message for pt to call back  °

## 2015-08-20 ENCOUNTER — Other Ambulatory Visit: Payer: Self-pay

## 2015-08-20 DIAGNOSIS — R7989 Other specified abnormal findings of blood chemistry: Secondary | ICD-10-CM

## 2015-08-20 DIAGNOSIS — R945 Abnormal results of liver function studies: Principal | ICD-10-CM

## 2015-08-21 ENCOUNTER — Encounter (INDEPENDENT_AMBULATORY_CARE_PROVIDER_SITE_OTHER): Payer: Self-pay | Admitting: Neurology

## 2015-08-21 ENCOUNTER — Encounter: Payer: Medicare Other | Admitting: Neurology

## 2015-08-21 ENCOUNTER — Encounter (INDEPENDENT_AMBULATORY_CARE_PROVIDER_SITE_OTHER): Payer: Self-pay | Admitting: Diagnostic Neuroimaging

## 2015-08-21 ENCOUNTER — Other Ambulatory Visit: Payer: Self-pay

## 2015-08-21 DIAGNOSIS — Z0289 Encounter for other administrative examinations: Secondary | ICD-10-CM

## 2015-08-21 NOTE — Telephone Encounter (Signed)
Left message for pt to call back  °

## 2015-08-21 NOTE — Telephone Encounter (Signed)
Detailed message left on answering machine regarding resutls and plan.

## 2015-08-25 ENCOUNTER — Telehealth: Payer: Self-pay

## 2015-08-25 NOTE — Telephone Encounter (Signed)
I spoke to the patient's spouse, Valery Chance, in regards to the CREAD study. Glenda wanted to know the results of the Screening Visit that took place on [id number]. I explained to her that all of the screening procedures went well, but we were waiting to hear from one. I told her that I would call her back as soon as I find out the results of that procedure.

## 2015-08-27 ENCOUNTER — Telehealth: Payer: Self-pay

## 2015-08-27 ENCOUNTER — Other Ambulatory Visit: Payer: Self-pay | Admitting: Neurology

## 2015-08-27 DIAGNOSIS — F0390 Unspecified dementia without behavioral disturbance: Secondary | ICD-10-CM

## 2015-08-27 NOTE — Telephone Encounter (Signed)
I spoke to the patient's spouse, Quasean Pinho, in regards to the CREAD study. I let her know that the patient qualifies at this point for the study. I let her know that we will call her to schedule the MRI appointment. Mrs. Fien voiced understanding.

## 2015-09-01 ENCOUNTER — Ambulatory Visit
Admission: RE | Admit: 2015-09-01 | Discharge: 2015-09-01 | Disposition: A | Discharge status: Home / Self Care | Payer: No Typology Code available for payment source | Source: Ambulatory Visit | Attending: Neurology | Admitting: Neurology

## 2015-09-01 DIAGNOSIS — F0390 Unspecified dementia without behavioral disturbance: Secondary | ICD-10-CM

## 2015-09-05 ENCOUNTER — Telehealth: Payer: Self-pay

## 2015-09-05 NOTE — Telephone Encounter (Signed)
Subjects wife was informed yesterday ( 11/18) re: MRI results.

## 2015-09-08 ENCOUNTER — Other Ambulatory Visit: Payer: Self-pay | Admitting: Neurology

## 2015-09-08 DIAGNOSIS — F0280 Dementia in other diseases classified elsewhere without behavioral disturbance: Secondary | ICD-10-CM

## 2015-09-08 DIAGNOSIS — G309 Alzheimer's disease, unspecified: Principal | ICD-10-CM

## 2015-09-14 ENCOUNTER — Ambulatory Visit
Admission: RE | Admit: 2015-09-14 | Discharge: 2015-09-14 | Disposition: A | Discharge status: Home / Self Care | Payer: No Typology Code available for payment source | Source: Ambulatory Visit | Attending: Neurology | Admitting: Neurology

## 2015-09-14 DIAGNOSIS — G309 Alzheimer's disease, unspecified: Principal | ICD-10-CM

## 2015-09-14 DIAGNOSIS — F0280 Dementia in other diseases classified elsewhere without behavioral disturbance: Secondary | ICD-10-CM

## 2015-09-17 ENCOUNTER — Other Ambulatory Visit: Payer: Self-pay | Admitting: Neurology

## 2015-09-17 DIAGNOSIS — G3183 Dementia with Lewy bodies: Principal | ICD-10-CM

## 2015-09-17 DIAGNOSIS — F0280 Dementia in other diseases classified elsewhere without behavioral disturbance: Secondary | ICD-10-CM

## 2015-09-18 ENCOUNTER — Telehealth: Payer: Self-pay | Admitting: Neurology

## 2015-09-18 NOTE — Telephone Encounter (Signed)
I spoke with Cesar [name].  I told her that [name] would be moving on to the next step of the process and that I would need to schedule the PET Scan.  I asked her if [name] was claustrophobic.  She said he was not.  I called [facility] scheduling and scheduled his PET scan.  He is scheduled to come in on Dec. 9th at 9am.  His arrival time is at 8:45am and he is not to eat or drink for 6 hours prior to his PET Scan.  I called Glenda back and relayed this information to her.

## 2015-09-21 ENCOUNTER — Telehealth: Payer: Self-pay | Admitting: Neurology

## 2015-09-21 NOTE — Telephone Encounter (Signed)
I left a message for Cesar [name].  I let her know that we will need to reschedule Cesar [name]' PET Scan.  I asked if she would be ok with moving the PET Scan to 2pm on Dec. 9th vs. 9am.  I left my direct telephone number and asked that she call me back at her earliest convenience.

## 2015-09-21 NOTE — Telephone Encounter (Signed)
I spoke with Cesar [name].  She stated that for the CREAD study, the patients can eat and drink as normal.  I called [name] back and relayed this message to her.  She confirmed that she would like to keep her appointment at Dec. 9th at 2pm.

## 2015-09-21 NOTE — Telephone Encounter (Signed)
I received a phone call from [employer].  She said that it would be ok to move [employer]' PET Scan to Dec. 9th at 2pm.  She asked if he could eat or drink something before then because it would be very difficult for [name] to go 8 hours without food or drink.  I told her that I did not know the answer to that question and that I would ask.  I sent an email to Madelaine Etienne posing that question.  I told [name] that I would call her back after I received an answer.

## 2015-09-25 ENCOUNTER — Encounter (HOSPITAL_COMMUNITY)
Admission: RE | Admit: 2015-09-25 | Discharge: 2015-09-25 | Disposition: A | Discharge status: Home / Self Care | Payer: Self-pay | Source: Ambulatory Visit | Attending: Neurology | Admitting: Neurology

## 2015-09-25 ENCOUNTER — Encounter (HOSPITAL_COMMUNITY): Payer: Self-pay

## 2015-09-25 DIAGNOSIS — G3183 Dementia with Lewy bodies: Secondary | ICD-10-CM | POA: Insufficient documentation

## 2015-09-25 DIAGNOSIS — F0280 Dementia in other diseases classified elsewhere without behavioral disturbance: Secondary | ICD-10-CM | POA: Insufficient documentation

## 2015-10-02 ENCOUNTER — Telehealth: Payer: Self-pay | Admitting: Neurology

## 2015-10-02 ENCOUNTER — Telehealth: Payer: Self-pay

## 2015-10-02 NOTE — Telephone Encounter (Signed)
I spoke to the patient's wife, Yonathan Demke, in regards to the CREAD study. I let her know that the patient qualifies for the study, and I transferred her to [place], to schedule the Baseline visit.

## 2015-10-02 NOTE — Telephone Encounter (Signed)
I was transferred a phone call from [facility].  I spoke with [facility] and let her know that Cesar [name] was a candidate for CREAD and that I needed to schedule his Baseline visit.  I scheduled the visit for 10/14/2015 at 8:00am.  I let her know that the visit would last for 6-6.5 hours and that we would provide lunch.  [name] stated that [name] could bring ([employer]) like last time and that she would bring her own meal due to her Celiac Disease.  She also asked if Cesar [name] could take his regular medication that morning.  I asked [name] that question and he stated that the patient could take his regular medication that morning.  I told this to [facility] and she was pleased.  She confirmed that she and Cesar [name] would be at this appointment.

## 2015-10-13 ENCOUNTER — Telehealth: Payer: Self-pay | Admitting: Neurology

## 2015-10-13 NOTE — Telephone Encounter (Signed)
I spoke with Cesar [name].  I reminded her of Jua Kiker. Masoner'  Research appointment for tomorrow, 10/14/2015 at 8am.  She confirmed that she would be here at 8am.

## 2015-10-13 NOTE — Telephone Encounter (Signed)
I spoke with [name].  I let her know that we would need to reschedule Cesar [name]' Baseline appointment.  We rescheduled for Thursday, 10/15/2015 at 8am

## 2015-10-14 ENCOUNTER — Encounter: Payer: Medicare Other | Admitting: Diagnostic Neuroimaging

## 2015-10-14 ENCOUNTER — Telehealth: Payer: Self-pay | Admitting: Neurology

## 2015-10-14 NOTE — Telephone Encounter (Signed)
I spoke with Cesar [name].  I confirmed Cesar [name]. Yanik' Research appointment for tomorrow at [employer].

## 2015-10-20 ENCOUNTER — Telehealth: Payer: Self-pay

## 2015-10-20 NOTE — Telephone Encounter (Signed)
I spoke to the patient's caregiver, Bronislaw Mesic, in regards to the CREAD study. I performed the Week 1 (Day 2) procedures as per protocol. [name] stated that the patient has not experienced any serious adverse events within the last 24 hours or any changes in medications. [name] stated that the patient experienced spasms on the right knee on [id number] for about 6 hours; the spasms were intermittent; however, [name] thinks that this is not related to the study medication. I thanked her for her time.

## 2015-10-23 ENCOUNTER — Encounter: Payer: Self-pay | Admitting: Family

## 2015-10-28 ENCOUNTER — Ambulatory Visit (INDEPENDENT_AMBULATORY_CARE_PROVIDER_SITE_OTHER): Payer: Medicare Other | Admitting: Family

## 2015-10-28 ENCOUNTER — Ambulatory Visit (INDEPENDENT_AMBULATORY_CARE_PROVIDER_SITE_OTHER)
Admission: RE | Admit: 2015-10-28 | Discharge: 2015-10-28 | Disposition: A | Discharge status: Home / Self Care | Payer: Medicare Other | Source: Ambulatory Visit | Attending: Family | Admitting: Family

## 2015-10-28 ENCOUNTER — Encounter: Payer: Self-pay | Admitting: Family

## 2015-10-28 ENCOUNTER — Ambulatory Visit: Payer: Medicare Other | Admitting: Vascular Surgery

## 2015-10-28 ENCOUNTER — Other Ambulatory Visit (HOSPITAL_COMMUNITY): Payer: Medicare Other

## 2015-10-28 ENCOUNTER — Other Ambulatory Visit: Payer: Self-pay | Admitting: Family

## 2015-10-28 ENCOUNTER — Ambulatory Visit (HOSPITAL_COMMUNITY)
Admission: RE | Admit: 2015-10-28 | Discharge: 2015-10-28 | Disposition: A | Discharge status: Home / Self Care | Payer: Medicare Other | Source: Ambulatory Visit | Attending: Family | Admitting: Family

## 2015-10-28 VITALS — BP 139/74 | HR 49 | Temp 97.3°F | Resp 14 | Ht 70.5 in | Wt 175.0 lb

## 2015-10-28 DIAGNOSIS — R0989 Other specified symptoms and signs involving the circulatory and respiratory systems: Secondary | ICD-10-CM

## 2015-10-28 DIAGNOSIS — I6523 Occlusion and stenosis of bilateral carotid arteries: Secondary | ICD-10-CM

## 2015-10-28 DIAGNOSIS — Z95828 Presence of other vascular implants and grafts: Secondary | ICD-10-CM

## 2015-10-28 DIAGNOSIS — I714 Abdominal aortic aneurysm, without rupture, unspecified: Secondary | ICD-10-CM

## 2015-10-28 NOTE — Patient Instructions (Signed)
Stroke Prevention Some medical conditions and behaviors are associated with an increased chance of having a stroke. You may prevent a stroke by making healthy choices and managing medical conditions. HOW CAN I REDUCE MY RISK OF HAVING A STROKE?   Stay physically active. Get at least 30 minutes of activity on most or all days.  Do not smoke. It may also be helpful to avoid exposure to secondhand smoke.  Limit alcohol use. Moderate alcohol use is considered to be:  No more than 2 drinks per day for men.  No more than 1 drink per day for nonpregnant women.  Eat healthy foods. This involves:  Eating 5 or more servings of fruits and vegetables a day.  Making dietary changes that address high blood pressure (hypertension), high cholesterol, diabetes, or obesity.  Manage your cholesterol levels.  Making food choices that are high in fiber and low in saturated fat, trans fat, and cholesterol may control cholesterol levels.  Take any prescribed medicines to control cholesterol as directed by your health care provider.  Manage your diabetes.  Controlling your carbohydrate and sugar intake is recommended to manage diabetes.  Take any prescribed medicines to control diabetes as directed by your health care provider.  Control your hypertension.  Making food choices that are low in salt (sodium), saturated fat, trans fat, and cholesterol is recommended to manage hypertension.  Ask your health care provider if you need treatment to lower your blood pressure. Take any prescribed medicines to control hypertension as directed by your health care provider.  If you are 18-39 years of age, have your blood pressure checked every 3-5 years. If you are 40 years of age or older, have your blood pressure checked every year.  Maintain a healthy weight.  Reducing calorie intake and making food choices that are low in sodium, saturated fat, trans fat, and cholesterol are recommended to manage  weight.  Stop drug abuse.  Avoid taking birth control pills.  Talk to your health care provider about the risks of taking birth control pills if you are over 35 years old, smoke, get migraines, or have ever had a blood clot.  Get evaluated for sleep disorders (sleep apnea).  Talk to your health care provider about getting a sleep evaluation if you snore a lot or have excessive sleepiness.  Take medicines only as directed by your health care provider.  For some people, aspirin or blood thinners (anticoagulants) are helpful in reducing the risk of forming abnormal blood clots that can lead to stroke. If you have the irregular heart rhythm of atrial fibrillation, you should be on a blood thinner unless there is a good reason you cannot take them.  Understand all your medicine instructions.  Make sure that other conditions (such as anemia or atherosclerosis) are addressed. SEEK IMMEDIATE MEDICAL CARE IF:   You have sudden weakness or numbness of the face, arm, or leg, especially on one side of the body.  Your face or eyelid droops to one side.  You have sudden confusion.  You have trouble speaking (aphasia) or understanding.  You have sudden trouble seeing in one or both eyes.  You have sudden trouble walking.  You have dizziness.  You have a loss of balance or coordination.  You have a sudden, severe headache with no known cause.  You have new chest pain or an irregular heartbeat. Any of these symptoms may represent a serious problem that is an emergency. Do not wait to see if the symptoms will   go away. Get medical help at once. Call your local emergency services (911 in [place] Do not drive yourself to the hospital.   This information is not intended to replace advice given to you by your health care provider. Make sure you discuss any questions you have with your health care provider.   Document Released: 11/10/2004 Document Revised: 10/24/2014 Document Reviewed:  04/05/2013 Elsevier Interactive Patient Education 2016 [employer]  

## 2015-10-28 NOTE — Progress Notes (Signed)
[facility] [place]  Established EVAR  History of Present Illness  Cesar [name] is a 80 y.o. ([birth year]) male patient of Dr. Scot Dock who is s/p EVAR in November 2011, and has known minimal bilateral extracranial carotid artery stenosis. He returns today for routine post EVAR and extracranial carotid artery surveillance.  Carotid Duplex result from January 2015 shows bilateral internal carotid artery velocities suggest <40% stenosis. He denies ever having stroke or TIA symptoms, denies claudication with walking, denies non-healing wounds, denies new medical problems or newsurgical hx since last seen. He has numbness in the lateral aspect of right foot, this has been evaluated, unknown etiology; it is noted that he has a history of lumbar disc surgery. He denies abdominal or back pain. He denies ever having an MI, had 4 vessel CABG in 2001.  Pt and wife state pt is in a clinical trial for an infusion medication for short term memory loss.  Wife states pt has been coughing lately.  Pt Diabetic: No Pt smoker: former smoker, quit in 2000  Pt meds include: Statin :No, wife states he had liver problems from a statin ASA: Yes Other anticoagulants/antiplatelets: no    Past Medical History  Diagnosis Date  . Depression   . Hyperlipidemia   . Hypertension   . Autoimmune hepatitis ([place])   . Coronary atherosclerosis of unspecified type of vessel, native or graft   . Peripheral vascular disease, unspecified ([place])   . Occlusion and stenosis of carotid artery without mention of cerebral infarction   . Unspecified hemorrhoids without mention of complication   . Osteoarthrosis, unspecified whether generalized or localized, unspecified site   . Other and unspecified hyperlipidemia   . AAA (abdominal aortic aneurysm) ([place])   . Cancer ([place])     skin  . Memory deficits 09/23/2013  . Cervicogenic headache   . Hx of cardiovascular stress test     LexiScan Myoview  (12/14):  No ischemia, EF 59%, normal study.   Past Surgical History  Procedure Laterality Date  . Lumbar disc surgery    . Triple a indo vascular graft  8/09  . Polypectomy    . Appendectomy    . Tonsillectomy    . Skin cancer excision    . Coronary artery bypass graft  2001    x4  . Spine surgery    . Abdominal aortic aneurysm repair  Nov. 15, 2011    EVAR    Social History Social History  Substance Use Topics  . Smoking status: Former Smoker    Quit date: 10/17/1968  . Smokeless tobacco: Never Used     Comment: quit 1970  . Alcohol Use: No   Family History Family History  Problem Relation Age of Onset  . Colon cancer Neg Hx   . Colon polyps Father   . Heart disease Mother     After age 49  . Hyperlipidemia Mother   . Heart attack Mother   . Multiple sclerosis Sister   . Diabetes Sister    Current Outpatient Prescriptions on File Prior to Visit  Medication Sig Dispense Refill  . aspirin 81 MG tablet Take 81 mg by mouth daily.      Marland Kitchen atenolol (TENORMIN) 25 MG tablet Take 25 mg by mouth daily.      Marland Kitchen donepezil (ARICEPT) 5 MG tablet TAKE 1 TABLET (5 MG TOTAL) BY MOUTH AT BEDTIME. 30 tablet 3  . fenofibrate 160 MG tablet Take 160 mg by mouth daily.      Marland Kitchen  memantine (NAMENDA) 10 MG tablet Take 1 tablet (10 mg total) by mouth 2 (two) times daily. 60 tablet 3  . Multiple Vitamin (MULTIVITAMIN WITH MINERALS) TABS tablet Take 1 tablet by mouth daily.    Marland Kitchen NITROSTAT 0.4 MG SL tablet DISSOLVE 1 TABLET UNDER THETONGUE AS NEEDED 25 tablet 1  . sertraline (ZOLOFT) 50 MG tablet Take 50 mg by mouth daily.       No current facility-administered medications on file prior to visit.   No Known Allergies   ROS: See HPI for pertinent positives and negatives.  Physical Examination  Filed Vitals:   10/28/15 0938  BP: 139/74  Pulse: 49  Temp: 97.3 F (36.3 C)  TempSrc: Oral  Resp: 14  Height: 5' 10.5" (1.791 m)  Weight: 175 lb (79.379 kg)  SpO2: 96%   Body mass index is  24.75 kg/(m^2).  General: WDWN in NAD Gait: Normal HENT: WNL Eyes: Pupils equal Pulmonary: normal non-labored breathing, + rales in all posterior fields, no rhonchi,or wheezing Cardiac: RRR, no detected murmur.  Abdomen: soft, NT, no palpable masses Skin: no rashes or ulcers; no cellulitis.  VASCULAR EXAM  Carotid Bruits Left Right   Negative positive   Aorta is slightly palpable Radial pulses are 2+ and =   VASCULAR EXAM: Extremities without ischemic changes  without Gangrene; without open wounds. Feet are warm, pink, appear well perfused.     LE Pulses LEFT RIGHT   FEMORAL 2+ palpable 2+ palpable    POPLITEAL not palpable  not palpable   POSTERIOR TIBIAL not palpable  not palpable    DORSALIS PEDIS  ANTERIOR TIBIAL not palpable  not palpable    Musculoskeletal: no muscle wasting or atrophy; no peripheral edema Neurologic: A&O X 3; Appropriate Affect ;  SENSATION: normal; MOTOR FUNCTION: 5/5 Symmetric, CN 2-12 intact Speech is fluent/normal        July 2014 ABI's: normal bilaterally with triphasic waveforms in the right leg and biphasic in the left leg.  Non-Invasive Vascular Imaging(Date: 10/28/2015)  EVAR Duplex   AAA sac size: 3.13 cm x 3.13 cm (10/21/2014: 3.5 cm x 3.7 cm)  no endoleak detected   Carotid Duplex <40% stenosis of the bilateral internal carotid artery Bilateral vertebral artery is antegrade. No significant change compared to prior exam.   Medical Decision Making  Cesar [name] is a 80 y.o. male who is s/p EVAR (Date: November 2011).   Pt is asymptomatic with a decrease sac size compared to a year ago.  He has no hx of stroke or TIA. Today's carotid duplex suggests minimal bilateral ICA  stenosis, no significant change compared to prior exam.   Absent pedal pulses with no signs of ischemia in the LE's; will check ABI's on his return in a year.  I advised pt and wife to let pt's PCP know that he has been coughing and that I hear crackles in his lungs.   I discussed with the patient the importance of surveillance of the endograft.  The next endograft duplex will be scheduled for 12 months. Will also schedule carotid duplex and ABI's.   The patient will follow up with [place] in 12 months with these studies.  I emphasized the importance of maximal medical management including strict control of blood pressure, blood glucose, and lipid levels, antiplatelet agents, obtaining regular exercise, and cessation of smoking.   Thank you for allowing [place] to participate in this patient's care.  Vinnie Level Nickel, RN, MSN, FNP-C [facility]  [place] Office: [phone]  Clinic Physician: Scot Dock  10/28/2015, 10:04 AM

## 2015-11-03 ENCOUNTER — Other Ambulatory Visit (HOSPITAL_COMMUNITY): Payer: Self-pay | Admitting: Respiratory Therapy

## 2015-11-03 ENCOUNTER — Other Ambulatory Visit: Payer: Self-pay | Admitting: Internal Medicine

## 2015-11-03 DIAGNOSIS — J479 Bronchiectasis, uncomplicated: Secondary | ICD-10-CM

## 2015-11-04 ENCOUNTER — Telehealth: Payer: Self-pay | Admitting: Neurology

## 2015-11-04 NOTE — Telephone Encounter (Signed)
Glenda Cartelli called.  She stated Cesar [name] had recently gone to the hospital and that his physician took him off Atenolol for two weeks.  She also said that the doctor had ordered a breathing test for [facility] on Jan. 27th and a CT scan of the lungs.  I told her that would relay this message to [facility].

## 2015-11-06 ENCOUNTER — Ambulatory Visit
Admission: RE | Admit: 2015-11-06 | Discharge: 2015-11-06 | Disposition: A | Discharge status: Home / Self Care | Payer: Medicare Other | Source: Ambulatory Visit | Attending: Internal Medicine | Admitting: Internal Medicine

## 2015-11-06 DIAGNOSIS — J479 Bronchiectasis, uncomplicated: Secondary | ICD-10-CM

## 2015-11-06 DIAGNOSIS — R911 Solitary pulmonary nodule: Secondary | ICD-10-CM | POA: Insufficient documentation

## 2015-11-06 DIAGNOSIS — IMO0001 Reserved for inherently not codable concepts without codable children: Secondary | ICD-10-CM | POA: Insufficient documentation

## 2015-11-09 NOTE — Telephone Encounter (Signed)
I spoke to the patient's spouse, Dmoni Causby, in regards to the dizziness and low blood pressure symptoms that the patient has been having during the last 10 days. [name] stated that the patient has always had dizziness, but it has gotten worse. She also stated that the patient's PCP took him off Atenolol to see if the medication is the cause of the low blood pressure. I asked Glenda to let us know if anything further develops. Finally, I reminded her of the patient's next visit on [id number] at 13:00h. Glenda voiced understanding and appreciation.

## 2015-11-13 ENCOUNTER — Ambulatory Visit (HOSPITAL_COMMUNITY)
Admission: RE | Admit: 2015-11-13 | Discharge: 2015-11-13 | Disposition: A | Discharge status: Home / Self Care | Payer: Medicare Other | Source: Ambulatory Visit | Attending: Internal Medicine | Admitting: Internal Medicine

## 2015-11-13 DIAGNOSIS — F17210 Nicotine dependence, cigarettes, uncomplicated: Secondary | ICD-10-CM | POA: Insufficient documentation

## 2015-11-13 DIAGNOSIS — J479 Bronchiectasis, uncomplicated: Secondary | ICD-10-CM | POA: Insufficient documentation

## 2015-11-13 LAB — PULMONARY FUNCTION TEST
DL/VA % pred: 45 %
DL/VA: 2.1 ml/min/mmHg/L
DLCO unc % pred: 32 %
DLCO unc: 10.79 ml/min/mmHg
FEF 25-75 Pre: 1.6 L/sec
FEF2575-%Pred-Pre: 72 %
FEV1-%Pred-Pre: 78 %
FEV1-Pre: 2.44 L
FEV1FVC-%Pred-Pre: 99 %
FEV6-%Pred-Pre: 82 %
FEV6-Pre: 3.32 L
FEV6FVC-%Pred-Pre: 103 %
FVC-%Pred-Pre: 78 %
FVC-Pre: 3.4 L
Pre FEV1/FVC ratio: 72 %
Pre FEV6/FVC Ratio: 98 %
RV % pred: 46 %
RV: 1.23 L
TLC % pred: 63 %
TLC: 4.62 L

## 2015-11-16 LAB — POCT B-TYPE NATRIURETIC PEPTIDE (BNP): Pro B Natriuretic peptide (BNP): 354.5

## 2015-11-23 ENCOUNTER — Ambulatory Visit (INDEPENDENT_AMBULATORY_CARE_PROVIDER_SITE_OTHER): Payer: Medicare Other | Admitting: Adult Health

## 2015-11-23 ENCOUNTER — Encounter: Payer: Self-pay | Admitting: Adult Health

## 2015-11-23 VITALS — BP 90/53 | HR 74 | Ht 70.5 in | Wt 178.0 lb

## 2015-11-23 DIAGNOSIS — R413 Other amnesia: Secondary | ICD-10-CM

## 2015-11-23 DIAGNOSIS — I6523 Occlusion and stenosis of bilateral carotid arteries: Secondary | ICD-10-CM | POA: Diagnosis not present

## 2015-11-23 NOTE — Progress Notes (Signed)
I have read the note, and I agree with the clinical assessment and plan.  WILLIS,CHARLES KEITH   

## 2015-11-23 NOTE — Patient Instructions (Signed)
Memory score is stable Continue Aricept and Namenda If your symptoms worsen or you develop new symptoms please let us know.   

## 2015-11-23 NOTE — Progress Notes (Signed)
PATIENT: Cesar [name] DOB: 1936-05-17  REASON FOR VISIT: follow up- memory HISTORY FROM: patient  HISTORY OF PRESENT ILLNESS:  Cesar [name] is a 80 year old male with a history of a mild memory disorder. He returns today for follow-up. The patient is currently taking Aricept 5 mg as well as Namenda. He is tolerating this medication well. The patient is also  Participating in a research trial for dementia.  The patient is able to complete all ADLs independently. He operates a Teacher, music without difficulty. Although he does not drive very often. The patient states that he is sleeping well. The patient reports that he did have some issues with vertigo however he went to his primary care and they gave him exercises to complete and that has been beneficial. He denies any new neurological symptoms. He returns today for an evaluation.  HISTORY 07/23/15: Cesar [name] is a 80 year old male with a history of mild memory disorder. He returns today for follow-up. He is currently taking Aricept 5 mg daily as well as Namenda. Patient feels that his memory has remained stable. He denies any significant changes. He is able to complete all ADLs independently. He operates a Teacher, music without difficulty. He assists his wife with the finances. He does not feel that he's had to give up anything due to his memory. He does find that when he is in social situations he tends to not be completely engaged due to his memory. 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 The patient states that he sleeps well at night. Denies any agitation or aggressive behavior. Denies any new medical issues. He returns today for an evaluation.  REVIEW OF SYSTEMS: Out of a complete 14 system review of symptoms, the patient complains only of the following symptoms, and all other reviewed systems are negative.   Memory loss, dizziness, headache, neck pain  ALLERGIES: No Known Allergies  HOME  MEDICATIONS: Outpatient Prescriptions Prior to Visit  Medication Sig Dispense Refill  . ascorbic acid (VITAMIN C) 250 MG CHEW Chew 250 mg by mouth 2 (two) times daily.    Marland Kitchen aspirin 81 MG tablet Take 81 mg by mouth daily.      Marland Kitchen atenolol (TENORMIN) 25 MG tablet Take 25 mg by mouth daily.      Marland Kitchen donepezil (ARICEPT) 5 MG tablet TAKE 1 TABLET (5 MG TOTAL) BY MOUTH AT BEDTIME. 30 tablet 3  . fenofibrate 160 MG tablet Take 160 mg by mouth daily.      . memantine (NAMENDA) 10 MG tablet Take 1 tablet (10 mg total) by mouth 2 (two) times daily. 60 tablet 3  . Multiple Vitamin (MULTIVITAMIN WITH MINERALS) TABS tablet Take 1 tablet by mouth daily.    Marland Kitchen NITROSTAT 0.4 MG SL tablet DISSOLVE 1 TABLET UNDER THETONGUE AS NEEDED 25 tablet 1  . sertraline (ZOLOFT) 50 MG tablet Take 50 mg by mouth daily.       No facility-administered medications prior to visit.    PAST MEDICAL HISTORY: Past Medical History  Diagnosis Date  . Depression   . Hyperlipidemia   . Hypertension   . Autoimmune hepatitis ([place])   . Coronary atherosclerosis of unspecified type of vessel, native or graft   . Peripheral vascular disease, unspecified ([place])   . Occlusion and stenosis of carotid artery without mention of cerebral infarction   . Unspecified hemorrhoids without mention of complication   . Osteoarthrosis, unspecified whether generalized or localized, unspecified site   .  Other and unspecified hyperlipidemia   . AAA (abdominal aortic aneurysm) ([facility])   . Cancer ([facility])     skin  . Memory deficits 09/23/2013  . Cervicogenic headache   . Hx of cardiovascular stress test     LexiScan Myoview (12/14):  No ischemia, EF 59%, normal study.    PAST SURGICAL HISTORY: Past Surgical History  Procedure Laterality Date  . Lumbar disc surgery    . Triple a indo vascular graft  8/09  . Polypectomy    . Appendectomy    . Tonsillectomy    . Skin cancer excision    . Coronary artery bypass graft  2001    x4  . Spine surgery     . Abdominal aortic aneurysm repair  Nov. 15, 2011    EVAR     FAMILY HISTORY: Family History  Problem Relation Age of Onset  . Colon cancer Neg Hx   . Colon polyps Father   . Heart disease Mother     After age 60  . Hyperlipidemia Mother   . Heart attack Mother   . Multiple sclerosis Sister   . Diabetes Sister     SOCIAL HISTORY: Social History   Social History  . Marital Status: Married    Spouse Name: Cesar [name]  . Number of Children: 4  . Years of Education: DOCTORATE   Occupational History  . Retired     [employer]   Social History Main Topics  . Smoking status: Former Smoker    Quit date: 10/17/1968  . Smokeless tobacco: Never Used     Comment: quit 1970  . Alcohol Use: No  . Drug Use: No  . Sexual Activity: Not on file   Other Topics Concern  . Not on file   Social History Narrative   Patient lives at home with wife Cesar [name].    Patient has 4 children.    Patient is retired.   Patient has a college education.       PHYSICAL EXAM  Filed Vitals:   11/23/15 0849  BP: 90/53  Pulse: 74  Height: 5' 10.5" (1.791 m)  Weight: 178 lb (80.74 kg)   Body mass index is 25.17 kg/(m^2).  MMSE - Mini Mental State Exam 11/23/2015 07/23/2015 01/22/2015  Orientation to time 4 4 5  Orientation to Place 5 4 5  Registration 3 3 3  Attention/ Calculation 5 5 5  Recall 0 0 3  Language- name 2 objects 2 2 2  Language- repeat 1 0 1  Language- follow 3 step command 3 3 1  Language- read & follow direction 1 1 1  Write a sentence 1 1 1  Copy design 1 1 1  Total score 26 24 28     Generalized: Well developed, in no acute distress   Neurological examination  Mentation: Alert oriented to time, place, history taking. Follows all commands speech and language fluent Cranial nerve II-XII: Pupils were equal round reactive to light. Extraocular movements were full, visual field were full on confrontational test. Facial sensation and strength were normal. Uvula tongue midline. Head  turning and shoulder shrug  were normal and symmetric. Motor: The motor testing reveals 5 over 5 strength of all 4 extremities. Good symmetric motor tone is noted throughout.  Sensory: Sensory testing is intact to soft touch on all 4 extremities. No evidence of extinction is noted.  Coordination: Cerebellar testing reveals good finger-nose-finger and heel-to-shin bilaterally.  Gait and station: Gait is normal. Tandem gait is normal.   Romberg is negative. No drift is seen.  Reflexes: Deep tendon reflexes are symmetric and normal bilaterally.   DIAGNOSTIC DATA (LABS, IMAGING, TESTING) - I reviewed patient records, labs, notes, testing and imaging myself where available.  Lab Results  Component Value Date   WBC 10.8* 09/01/2010   HGB 11.4* 09/01/2010   HCT 34.3* 09/01/2010   MCV 92.5 09/01/2010   PLT 219 09/01/2010      Component Value Date/Time   NA 137 09/01/2010 0400   K 4.0 09/01/2010 0400   CL 109 09/01/2010 0400   CO2 24 09/01/2010 0400   GLUCOSE 110* 09/01/2010 0400   BUN 13 09/01/2010 0400   CREATININE 1.14 09/01/2010 0400   CALCIUM 8.8 09/01/2010 0400   PROT 7.6 08/19/2015 1225   ALBUMIN 3.4* 08/19/2015 1225   AST 42* 08/19/2015 1225   ALT 22 08/19/2015 1225   ALKPHOS 44 08/19/2015 1225   BILITOT 0.4 08/19/2015 1225   GFRNONAA >60 09/01/2010 0400   GFRAA  09/01/2010 0400    >60        The eGFR has been calculated using the MDRD equation. This calculation has not been validated in all clinical situations. eGFR's persistently <60 mL/min signify possible Chronic Kidney Disease.     ASSESSMENT AND PLAN 79 y.o. year old male  has a past medical history of Depression; Hyperlipidemia; Hypertension; Autoimmune hepatitis ([facility]); Coronary atherosclerosis of unspecified type of vessel, native or graft; Peripheral vascular disease, unspecified ([facility]); Occlusion and stenosis of carotid artery without mention of cerebral infarction; Unspecified hemorrhoids without mention of  complication; Osteoarthrosis, unspecified whether generalized or localized, unspecified site; Other and unspecified hyperlipidemia; AAA (abdominal aortic aneurysm) ([facility]); Cancer ([facility]); Memory deficits (09/23/2013); Cervicogenic headache; and cardiovascular stress test. here with:   1. Mild memory disorder   Overall the patient's memory has remained stable. His MMSE today is 26/30 was producing 24/30. He will continue on Aricept 5 mg daily as well as Namenda. Patient advised that if his symptoms worsen or he develops any new symptoms he should let us know. He will follow-up in 6 months with Dr. Willis.      , MSN, NP-C 11/23/2015, 9:41 AM [facility] [address], [address] [place], [place] [address] [phone]    

## 2015-12-08 ENCOUNTER — Telehealth: Payer: Self-pay | Admitting: Neurology

## 2015-12-08 NOTE — Telephone Encounter (Signed)
I spoke with Burton Apley "[employer].  I reminded him of his research appointment for tomorrow at 1pm.  He confirmed that he would attend.

## 2015-12-09 ENCOUNTER — Encounter (INDEPENDENT_AMBULATORY_CARE_PROVIDER_SITE_OTHER): Payer: Self-pay

## 2015-12-09 DIAGNOSIS — Z0289 Encounter for other administrative examinations: Secondary | ICD-10-CM

## 2015-12-11 ENCOUNTER — Other Ambulatory Visit: Payer: Self-pay | Admitting: Neurology

## 2015-12-11 DIAGNOSIS — F0390 Unspecified dementia without behavioral disturbance: Secondary | ICD-10-CM

## 2015-12-11 DIAGNOSIS — F0280 Dementia in other diseases classified elsewhere without behavioral disturbance: Secondary | ICD-10-CM

## 2015-12-11 DIAGNOSIS — G3183 Dementia with Lewy bodies: Principal | ICD-10-CM

## 2015-12-16 ENCOUNTER — Other Ambulatory Visit (INDEPENDENT_AMBULATORY_CARE_PROVIDER_SITE_OTHER): Payer: Medicare Other

## 2015-12-16 ENCOUNTER — Encounter: Payer: Self-pay | Admitting: Pulmonary Disease

## 2015-12-16 ENCOUNTER — Telehealth: Payer: Self-pay | Admitting: Pulmonary Disease

## 2015-12-16 ENCOUNTER — Ambulatory Visit (INDEPENDENT_AMBULATORY_CARE_PROVIDER_SITE_OTHER): Payer: Medicare Other | Admitting: Pulmonary Disease

## 2015-12-16 VITALS — BP 110/68 | HR 73 | Ht 71.0 in | Wt 181.4 lb

## 2015-12-16 DIAGNOSIS — J849 Interstitial pulmonary disease, unspecified: Secondary | ICD-10-CM

## 2015-12-16 DIAGNOSIS — I6523 Occlusion and stenosis of bilateral carotid arteries: Secondary | ICD-10-CM | POA: Diagnosis not present

## 2015-12-16 DIAGNOSIS — F32A Depression, unspecified: Secondary | ICD-10-CM | POA: Insufficient documentation

## 2015-12-16 DIAGNOSIS — M1990 Unspecified osteoarthritis, unspecified site: Secondary | ICD-10-CM | POA: Insufficient documentation

## 2015-12-16 DIAGNOSIS — F329 Major depressive disorder, single episode, unspecified: Secondary | ICD-10-CM | POA: Insufficient documentation

## 2015-12-16 DIAGNOSIS — J984 Other disorders of lung: Secondary | ICD-10-CM | POA: Diagnosis not present

## 2015-12-16 DIAGNOSIS — R911 Solitary pulmonary nodule: Secondary | ICD-10-CM

## 2015-12-16 DIAGNOSIS — IMO0001 Reserved for inherently not codable concepts without codable children: Secondary | ICD-10-CM

## 2015-12-16 LAB — C-REACTIVE PROTEIN: CRP: 0.5 mg/dL (ref 0.5–20.0)

## 2015-12-16 NOTE — Telephone Encounter (Signed)
PFT 11/03/15: FVC 3.40 L (78%) FEV1 2.44 L (78%) FEV1/FVC 0.72 FEF 25-75 1.60 L (72%) TLC 4.62 L (63%) RV 46% ERV 109% DLCO uncorrected 32%  IMAGING CT CHEST W/O 11/06/15 (personally reviewed by me): Basilar predominant subpleural intralobular septal thickening. Suggestion of subpleural honeycomb changes as well. No gross bronchiectasis. Calcified nodule in left face consistent with prior granulomatous. No identification of calcifications within spleen or liver. No pathologic mediastinal adenopathy. Minimal pleural thickening on the right without pleural effusion. 3 mm right apical nodule. These changes appear to be present on abdominal CT imaging going back to 2009.  LABS ABG on RA:  7.40 / 37 / 88 / 97%  02/11/09 Alpha-1 antitrypsin: 189 ANA: Negative

## 2015-12-16 NOTE — Patient Instructions (Signed)
   I will call you with the results of your blood work or CT scan if we need to do anything before your next appointment. Otherwise we will review the test results at that time.  We are doing a CT scan of your chest to better see/evaluate the extent of the scar tissue in your lungs  I'm doing blood work to evaluate for possible causes were your body could be attacking her lungs.  We will do a breathing test and walking test on or before your next appointment.  I may refer you to pulmonary rehabilitation after your next appointment to help with exercise and improved your functional capabilities depending upon the results.  Please call my office if you have any further questions or concerns before your next appointment.  I will see you back in 4-6 weeks   TESTS ORDERED: 1. High-resolution CT scan of chest without contrast 2. Serum autoimmune workup and lab tests today 3. 6 minute walk test on room air on her before next appointment 4. Spirometry with DLCO on her before next appointment

## 2015-12-16 NOTE — Progress Notes (Signed)
Subjective:    Patient ID: Cesar [name], male    DOB: 08-29-1936, 80 y.o.   MRN: [id number]  HPI He reports he has noticed increased dyspnea on exertion particularly over the last year. He reports he has noticed increased heart rate with his dyspnea as well. His wife reports she never noticed a breathing problem prior to the last 6 months. His wife reports he has had intermittent coughing that seems to be more frequent, especially at night. He reports it is rarely productive of a cumulative amount of 1tsp in a day. He reports it is usually clear. Denies any wheezing. Wife reports that last week he was able to walk 4/10 of a mile but the week prior he had to stop after 2/10 (1 lap around his block) due to reported fatigue in his legs. Denies any reflux, dyspepsia, or morning brash water taste. Denies any rashes. He reports stiffness in his hands that lasts all day long and has been worse over the last 6 months. Denies any joint swelling or erythema. No other joint pain. He does have occasional cramping in his right leg but no other myalgias. No Raynauds. No dysphagia or odynophagia. No dry eyes or dry mouth. No oral ulcers. No history of chemotherapy or XRT to his chest. No history of amiodarone exposure. Previously was found to have hepatitis that was thought to be autoimmune. Patient was treated with a course of Predisone approximately 5 years ago.   Review of Systems No dysuria or hematuria. He does report dark urine occasionally. No adenopathy in his neck, groin, or axilla. A pertinent 14 point review of systems is negative except as per the history of presenting illness.  No Known Allergies  Current Outpatient Prescriptions on File Prior to Visit  Medication Sig Dispense Refill  . ascorbic acid (VITAMIN C) 250 MG CHEW Chew 250 mg by mouth 2 (two) times daily.    Marland Kitchen aspirin 81 MG tablet Take 81 mg by mouth daily.      Marland Kitchen atenolol (TENORMIN) 25 MG tablet Take 25 mg by mouth daily.      Marland Kitchen  donepezil (ARICEPT) 5 MG tablet TAKE 1 TABLET (5 MG TOTAL) BY MOUTH AT BEDTIME. 30 tablet 3  . fenofibrate 160 MG tablet Take 160 mg by mouth daily.      . memantine (NAMENDA) 10 MG tablet Take 1 tablet (10 mg total) by mouth 2 (two) times daily. 60 tablet 3  . Multiple Vitamin (MULTIVITAMIN WITH MINERALS) TABS tablet Take 1 tablet by mouth daily.    Marland Kitchen NITROSTAT 0.4 MG SL tablet DISSOLVE 1 TABLET UNDER THETONGUE AS NEEDED 25 tablet 1  . sertraline (ZOLOFT) 50 MG tablet Take 50 mg by mouth daily.       No current facility-administered medications on file prior to visit.    Past Medical History  Diagnosis Date  . Depression   . Hyperlipidemia   . Hypertension   . Autoimmune hepatitis ([place])   . Coronary atherosclerosis of unspecified type of vessel, native or graft   . Peripheral vascular disease, unspecified ([place])   . Occlusion and stenosis of carotid artery without mention of cerebral infarction   . Unspecified hemorrhoids without mention of complication   . Osteoarthrosis, unspecified whether generalized or localized, unspecified site   . Other and unspecified hyperlipidemia   . AAA (abdominal aortic aneurysm) ([place])   . Cancer ([place])     skin  . Memory deficits 09/23/2013  . Cervicogenic headache   .  Hx of cardiovascular stress test     LexiScan Myoview (12/14):  No ischemia, EF 59%, normal study.  . ILD (interstitial lung disease) ([place])     Past Surgical History  Procedure Laterality Date  . Lumbar disc surgery    . Triple a indo vascular graft  8/09  . Polypectomy    . Appendectomy    . Tonsillectomy    . Skin cancer excision    . Coronary artery bypass graft  2001    x4  . Spine surgery    . Abdominal aortic aneurysm repair  Nov. 15, 2011    EVAR     Family History  Problem Relation Age of Onset  . Colon cancer Neg Hx   . Lung disease Neg Hx   . Rheumatologic disease Neg Hx   . Colon polyps Father   . Heart disease Mother     After age 8  . Hyperlipidemia  Mother   . Heart attack Mother   . Multiple sclerosis Sister   . Diabetes Sister   . Heart disease Paternal Grandmother     Social History   Social History  . Marital Status: Married    Spouse Name: [name]  . Number of Children: 4  . Years of Education: DOCTORATE   Occupational History  . Retired     Theme park manager   Social History Main Topics  . Smoking status: Former Smoker -- 1.00 packs/day for 40 years    Types: Cigarettes    Start date: 04/25/1960    Quit date: 10/18/1999  . Smokeless tobacco: Never Used     Comment: quit 1970  . Alcohol Use: 0.0 oz/week    0 Standard drinks or equivalent per week     Comment: rare alcohol  . Drug Use: No  . Sexual Activity: Not Asked   Other Topics Concern  . None   Social History Narrative   Patient lives at home with wife [name].    Patient has 4 children.    Patient is retired.   Patient has a college education.       [facility]:   Originally from [place], [place]. Has also lived in [place], [place], [place], [place]. 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 No pets currently. Had a [place] with his children and a second [place] in [place] just prior to 1985. No mold, asbestos, or hot tub exposure. He has done wood working with treated lumber but always domestic wood. No metal working.       Objective:   Physical Exam BP 110/68 mmHg  Pulse 73  Ht '5\' 11"'  (1.803 m)  Wt 181 lb 6.4 oz (82.283 kg)  BMI 25.31 kg/m2  SpO2 93% General:  Awake. Alert. No acute distress. Caucasian male. Wife accompanying patient today. Integument:  Warm & dry. No rash on exposed skin. No bruising. Tattoo on upper arm noted. Lymphatics:  No appreciated cervical or supraclavicular lymphadenoapthy. HEENT:  Moist mucus membranes. No oral ulcers. No scleral  injection or icterus.  Cardiovascular:  Regular rate. No edema. No appreciable JVD.  Pulmonary:  Basilar predominant Velcro crackles that progressively less and towards the apices. Symmetric chest wall expansion. No accessory muscle use on room air. Abdomen: Soft. Normal bowel sounds. Nondistended. Grossly nontender. Musculoskeletal:  Normal bulk and tone. Hand grip strength 5/5 bilaterally. No joint deformity or effusion appreciated. Mild synovial thickening of bilateral MCP & PIP joints. Neurological:  CN 2-12 grossly in tact. No meningismus. Moving all 4 extremities equally. Symmetric brachioradialis deep tendon reflexes. Psychiatric:  Mood and affect congruent. Speech normal rhythm, rate & tone.   PFT 11/03/15: FVC 3.40 L (78%) FEV1 2.44 L (78%) FEV1/FVC 0.72 FEF 25-75 1.60 L (72%) TLC 4.62 L (63%) RV 46% ERV 109% DLCO uncorrected 32%  IMAGING CT CHEST W/O 11/06/15 (personally reviewed by me): Basilar predominant subpleural intralobular septal thickening. Suggestion of subpleural honeycomb changes as well. No gross bronchiectasis. Calcified nodule in left face consistent with prior granulomatous. No identification of calcifications within spleen or liver. No pathologic mediastinal adenopathy. Minimal pleural thickening on the right without pleural effusion. 3 mm right apical nodule. These changes appear to be present on abdominal CT imaging going back to 2009.  LABS ABG on RA (08/30/10): 7.40 / 37 / 88 / 97%  02/11/09 Alpha-1 antitrypsin: 189 ANA: Negative     Assessment & Plan:  53 -year-old Caucasian male with evidence of interstitial lung disease on CT scan of the chest as well as moderate restriction on his pulmonary function testing from January of this year. Patient  does have a history of autoimmune hepatitis which responded to prednisone treatment raising the question of another possible autoimmune disease contributing to his underlying interstitial lung disease such as rheumatoid  arthritis. This is especially concerning given his history ofjoint pain/stiffness in his hands. Certainly previous exposure to birds/parakeets could have precipitated a hypersensitivity pneumonitis picture but the pattern seen on his CT imaging does not appear highly suggestive of this. It's difficult to discern whether or not the patient may have a UIP pattern on his CT imaging as this is not a high-resolution CT scan but grossly this seems to be most consistent with the basilar predominance and peripheral distribution. We did discuss immunosuppression but with the lack of data and further imaging information I feel this may be somewhat premature. It does seem as though his disease is progressively worsening with his report of dyspnea that has been more notable over the last 6 months to 1 year. Despite the patient's reported symptoms it does appear as though he has had some suggestion of fibrosis going back to abdominal CT scans from 2009. We did briefly discuss the potential need for a surgical lung biopsy to better evaluate his lung parenchyma and attempt to provide him with a definitive diagnosis depending upon the results of his serum testing and imaging workup. I instructed the patient to contact my office if he had any new problems with his breathing before his next appointment.  1. Interstitial lung disease: Checking high-resolution CT scan of the chest to better evaluate his lung parenchyma. Checking spirometry with DLCO and 6 minute walk test on room air to evaluate his functional capabilities and further track his lung function. Holding off on initiating treatment and/or immunosuppression at this time. Checking autoimmune workup with ESR, CRP, and a reflex to comprehensive panel, rheumatoid factor, anti-CCP, SSA, SSB, SCL 70, and hypersensitivity pneumonitis panel. 2. Restrictive lung disease: Most consistent with his underlying interstitial lung disease. Continuing autoimmune workup as noted  previously. 3. Right upper lobe nodule: Measures 3 mm on CT imaging. Given patient's prior tobacco exposure this will need to be followed. Plan to discuss timing of imaging at next appointment but I would not favor repeating a CT scan prior  to 6 months. 4. Arthritis: Unclear if this is autoimmune or simply osteoarthritis. Checking serum ANA with reflex to compress panel, ESR, CRP, rheumatoid factor, and anti-CCP. 5. Health maintenance: Received influenza vaccine September 2016. 6. Follow-up: Patient to return to clinic in 4-6 weeks.   Sonia Baller Ashok Cordia, M.D. [facility] Pager:  [phone] After 3pm or if no response, call [phone] 4:45 PM 12/16/2015

## 2015-12-17 LAB — SJOGRENS SYNDROME-B EXTRACTABLE NUCLEAR ANTIBODY: SSB (La) (ENA) Antibody, IgG: <1

## 2015-12-17 LAB — ANA W/REFLEX: ANA: NEGATIVE

## 2015-12-17 LAB — RHEUMATOID FACTOR: Rhuematoid fact SerPl-aCnc: 16 IU/mL — ABNORMAL HIGH (ref <=14)

## 2015-12-17 LAB — SEDIMENTATION RATE: Sed Rate: 34 mm/hr — ABNORMAL HIGH (ref 0–22)

## 2015-12-17 LAB — SJOGRENS SYNDROME-A EXTRACTABLE NUCLEAR ANTIBODY: SSA (Ro) (ENA) Antibody, IgG: <1

## 2015-12-17 LAB — ANTI-SCLERODERMA ANTIBODY: Scleroderma (Scl-70) (ENA) Antibody, IgG: <1

## 2015-12-17 LAB — CYCLIC CITRUL PEPTIDE ANTIBODY, IGG

## 2015-12-21 ENCOUNTER — Ambulatory Visit (INDEPENDENT_AMBULATORY_CARE_PROVIDER_SITE_OTHER)
Admission: RE | Admit: 2015-12-21 | Discharge: 2015-12-21 | Disposition: A | Discharge status: Home / Self Care | Payer: Medicare Other | Source: Ambulatory Visit | Attending: Pulmonary Disease | Admitting: Pulmonary Disease

## 2015-12-21 DIAGNOSIS — R911 Solitary pulmonary nodule: Secondary | ICD-10-CM | POA: Diagnosis not present

## 2015-12-21 DIAGNOSIS — J849 Interstitial pulmonary disease, unspecified: Secondary | ICD-10-CM

## 2015-12-21 DIAGNOSIS — IMO0001 Reserved for inherently not codable concepts without codable children: Secondary | ICD-10-CM

## 2015-12-22 LAB — HYPERSENSITIVITY PNUEMONITIS PROFILE

## 2015-12-23 ENCOUNTER — Encounter (INDEPENDENT_AMBULATORY_CARE_PROVIDER_SITE_OTHER): Payer: Self-pay

## 2015-12-24 ENCOUNTER — Encounter: Payer: Self-pay | Admitting: Pulmonary Disease

## 2015-12-25 ENCOUNTER — Other Ambulatory Visit: Payer: Self-pay | Admitting: Neurology

## 2015-12-25 ENCOUNTER — Telehealth: Payer: Self-pay

## 2015-12-25 ENCOUNTER — Telehealth: Payer: Self-pay | Admitting: Neurology

## 2015-12-25 DIAGNOSIS — G441 Vascular headache, not elsewhere classified: Secondary | ICD-10-CM

## 2015-12-25 MED ORDER — TRAMADOL HCL 50 MG PO TABS: 50.0000 mg | ORAL_TABLET | Freq: Four times a day (QID) | ORAL | Status: DC | PRN

## 2015-12-25 NOTE — Telephone Encounter (Signed)
i returned call from patient's wife and she informed me that he has been having daily headaches since her last infusion which are moderate to severe not accompanied by light sound sensitivity or vomiting. Patient already has an MRI scan of the brain scheduled as part of the study on 12/31/15. I recommend he keep that appointment and I called in a prescription of tramadol 50-100 mg 3 times daily as needed for headache. She was instructed to call our office if the headache does not respond or take him to the emergency room if it gets worse. She voiced understanding

## 2015-12-25 NOTE — Telephone Encounter (Signed)
I spoke to the patient's spouse, Maliq Inclan, in re traMADol (ULTRAM) 50 MG tablet. I confirmed with the patient that this Rx was called in by Dr. Erlinda Hong and that it will be ready to be picked up in an hour at her local pharmacy. Glenda voiced understanding and appreciation.

## 2015-12-25 NOTE — Telephone Encounter (Signed)
Dr. Leonie Man ordered tramadol for this pt but did not go through due to this medication can not be e-prescribed. I called his pharmacy at [facility] and called in for him. Tramadol 50mg  1-2 tab tid PRN, 30 tabs, one refill.   Rosalin Hawking, MD PhD Stroke Neurology 12/25/2015 4:33 PM

## 2015-12-25 NOTE — Telephone Encounter (Signed)
Patient's wife, Cesar [name], called to let us know that the patient has been suffering from headaches ever since his last infusion on [id number] for the CREAD study. The headaches last all day long, are worse in the morning, are constant, and go from the back of the head to front. Patient has tried Tylenol and Advil without any relieve. I told [name] that I would contact Dr. Leonie Man and will let her know what he says. She requested to be called back at her mobile phone.

## 2015-12-25 NOTE — Telephone Encounter (Signed)
Patient's wife, Jassiah Liberti, is calling to let us know that the traMADol (ULTRAM) 50 MG tablet Rx is not available at the pharmacy. I contacted the pharmacy, and they don't have a Rx for this patient on their system. I told the patient that although I could see the prescription on the system, I did not know why the pharmacy did not receive. I told her that I would ask Dr. Leonie Man to please reorder it.

## 2015-12-29 ENCOUNTER — Telehealth: Payer: Self-pay

## 2015-12-29 NOTE — Telephone Encounter (Signed)
I spoke to the patient's spouse, Treyvor Marsala, and she reports that the patient is feeling much better after taking traMADol (ULTRAM) 50 MG tablet as prescribed by Dr. Leonie Man. I reminded her of the patient's MRI on [id number] and of his next infusion appointment on 23MAR2017. She voiced understanding and appreciation.

## 2015-12-30 ENCOUNTER — Telehealth: Payer: Self-pay | Admitting: Pulmonary Disease

## 2015-12-30 NOTE — Telephone Encounter (Signed)
Spoke with the patient's wife regarding the results of his recent high-resolution CT scan. Pattern seems to be an atypical UIP pattern. Serum autoimmune workup thus far has remained negative. His wife does report he has had an increased cough with some sinus congestion this week. I urged her to contact our office if his cough worsened given the potential for worsening of lung function and/or pneumonia. With the elevation in his BNP I will discuss checking a transthoracic echocardiogram at his next appointment. I will hold off on initiating treatment with Ofev or Esbriet pending the results of his repeat pulmonary function testing at follow-up appointment.

## 2015-12-30 NOTE — Telephone Encounter (Signed)
thanks

## 2015-12-30 NOTE — Telephone Encounter (Signed)
Katrina kindly call patient and see if he got his tramadol

## 2015-12-31 ENCOUNTER — Ambulatory Visit
Admission: RE | Admit: 2015-12-31 | Discharge: 2015-12-31 | Disposition: A | Discharge status: Home / Self Care | Payer: Medicare Other | Source: Ambulatory Visit | Attending: Neurology | Admitting: Neurology

## 2015-12-31 DIAGNOSIS — F0390 Unspecified dementia without behavioral disturbance: Secondary | ICD-10-CM

## 2015-12-31 NOTE — Telephone Encounter (Signed)
Rn call [facility] pharmacy,and pt pick up medication on 3/10/.2017 already. No new prescription needs to be sent.

## 2016-01-01 ENCOUNTER — Telehealth: Payer: Self-pay

## 2016-01-01 NOTE — Telephone Encounter (Signed)
I spoke to the patient's spouse, Nigil Barile, in re the patient's upcoming infusion on 23MAR2017. I asked her if we could reschedule the appointment to [id number] at 13:00h, and she stated that this change was ok. Therefore, his next infusion for the CREAD study will be on 21MAR2017. I also asked her about the patient's headaches, and she stated that the patient is no longer having them, and that he no longer needs the medication. I thanked her for her time.

## 2016-01-05 ENCOUNTER — Encounter (INDEPENDENT_AMBULATORY_CARE_PROVIDER_SITE_OTHER): Payer: Self-pay

## 2016-01-05 DIAGNOSIS — Z0289 Encounter for other administrative examinations: Secondary | ICD-10-CM

## 2016-01-07 ENCOUNTER — Telehealth: Payer: Self-pay

## 2016-01-07 ENCOUNTER — Emergency Department (HOSPITAL_COMMUNITY)
Admission: EM | Admit: 2016-01-07 | Discharge: 2016-01-07 | Disposition: A | Discharge status: Home / Self Care | Payer: Medicare Other | Attending: Emergency Medicine | Admitting: Emergency Medicine

## 2016-01-07 ENCOUNTER — Emergency Department (HOSPITAL_COMMUNITY): Payer: Medicare Other

## 2016-01-07 ENCOUNTER — Encounter (HOSPITAL_COMMUNITY): Payer: Self-pay | Admitting: Cardiology

## 2016-01-07 DIAGNOSIS — Y9389 Activity, other specified: Secondary | ICD-10-CM | POA: Insufficient documentation

## 2016-01-07 DIAGNOSIS — Y998 Other external cause status: Secondary | ICD-10-CM | POA: Diagnosis not present

## 2016-01-07 DIAGNOSIS — Z79899 Other long term (current) drug therapy: Secondary | ICD-10-CM | POA: Insufficient documentation

## 2016-01-07 DIAGNOSIS — F329 Major depressive disorder, single episode, unspecified: Secondary | ICD-10-CM | POA: Insufficient documentation

## 2016-01-07 DIAGNOSIS — Z85828 Personal history of other malignant neoplasm of skin: Secondary | ICD-10-CM | POA: Diagnosis not present

## 2016-01-07 DIAGNOSIS — Z8719 Personal history of other diseases of the digestive system: Secondary | ICD-10-CM | POA: Diagnosis not present

## 2016-01-07 DIAGNOSIS — E785 Hyperlipidemia, unspecified: Secondary | ICD-10-CM | POA: Diagnosis not present

## 2016-01-07 DIAGNOSIS — Z87891 Personal history of nicotine dependence: Secondary | ICD-10-CM | POA: Insufficient documentation

## 2016-01-07 DIAGNOSIS — I2510 Atherosclerotic heart disease of native coronary artery without angina pectoris: Secondary | ICD-10-CM | POA: Insufficient documentation

## 2016-01-07 DIAGNOSIS — S0003XA Contusion of scalp, initial encounter: Secondary | ICD-10-CM | POA: Diagnosis not present

## 2016-01-07 DIAGNOSIS — Z8709 Personal history of other diseases of the respiratory system: Secondary | ICD-10-CM | POA: Diagnosis not present

## 2016-01-07 DIAGNOSIS — Y9289 Other specified places as the place of occurrence of the external cause: Secondary | ICD-10-CM | POA: Diagnosis not present

## 2016-01-07 DIAGNOSIS — W06XXXA Fall from bed, initial encounter: Secondary | ICD-10-CM | POA: Insufficient documentation

## 2016-01-07 DIAGNOSIS — I10 Essential (primary) hypertension: Secondary | ICD-10-CM | POA: Insufficient documentation

## 2016-01-07 DIAGNOSIS — S0990XA Unspecified injury of head, initial encounter: Secondary | ICD-10-CM | POA: Diagnosis present

## 2016-01-07 DIAGNOSIS — M1990 Unspecified osteoarthritis, unspecified site: Secondary | ICD-10-CM | POA: Diagnosis not present

## 2016-01-07 DIAGNOSIS — Z7982 Long term (current) use of aspirin: Secondary | ICD-10-CM | POA: Insufficient documentation

## 2016-01-07 DIAGNOSIS — R42 Dizziness and giddiness: Secondary | ICD-10-CM

## 2016-01-07 DIAGNOSIS — W19XXXA Unspecified fall, initial encounter: Secondary | ICD-10-CM

## 2016-01-07 MED ORDER — MECLIZINE HCL 12.5 MG PO TABS: 12.5000 mg | ORAL_TABLET | Freq: Three times a day (TID) | ORAL | Status: AC | PRN

## 2016-01-07 NOTE — Discharge Instructions (Signed)
Benign Positional Vertigo °Vertigo is the feeling that you or your surroundings are moving when they are not. Benign positional vertigo is the most common form of vertigo. The cause of this condition is not serious (is benign). This condition is triggered by certain movements and positions (is positional). This condition can be dangerous if it occurs while you are doing something that could endanger you or others, such as driving.  °CAUSES °In many cases, the cause of this condition is not known. It may be caused by a disturbance in an area of the inner ear that helps your brain to sense movement and balance. This disturbance can be caused by a viral infection (labyrinthitis), head injury, or repetitive motion. °RISK FACTORS °This condition is more likely to develop in: °· Women. °· People who are 50 years of age or older. °SYMPTOMS °Symptoms of this condition usually happen when you move your head or your eyes in different directions. Symptoms may start suddenly, and they usually last for less than a minute. Symptoms may include: °· Loss of balance and falling. °· Feeling like you are spinning or moving. °· Feeling like your surroundings are spinning or moving. °· Nausea and vomiting. °· Blurred vision. °· Dizziness. °· Involuntary eye movement (nystagmus). °Symptoms can be mild and cause only slight annoyance, or they can be severe and interfere with daily life. Episodes of benign positional vertigo may return (recur) over time, and they may be triggered by certain movements. Symptoms may improve over time. °DIAGNOSIS °This condition is usually diagnosed by medical history and a physical exam of the head, neck, and ears. You may be referred to a health care provider who specializes in ear, nose, and throat (ENT) problems (otolaryngologist) or a provider who specializes in disorders of the nervous system (neurologist). You may have additional testing, including: °· MRI. °· A CT scan. °· Eye movement tests. Your  health care provider may ask you to change positions quickly while he or she watches you for symptoms of benign positional vertigo, such as nystagmus. Eye movement may be tested with an electronystagmogram (ENG), caloric stimulation, the Dix-Hallpike test, or the roll test. °· An electroencephalogram (EEG). This records electrical activity in your brain. °· Hearing tests. °TREATMENT °Usually, your health care provider will treat this by moving your head in specific positions to adjust your inner ear back to normal. Surgery may be needed in severe cases, but this is rare. In some cases, benign positional vertigo may resolve on its own in 2-4 weeks. °HOME CARE INSTRUCTIONS °Safety °· Move slowly. Avoid sudden body or head movements. °· Avoid driving. °· Avoid operating heavy machinery. °· Avoid doing any tasks that would be dangerous to you or others if a vertigo episode would occur. °· If you have trouble walking or keeping your balance, try using a cane for stability. If you feel dizzy or unstable, sit down right away. °· Return to your normal activities as told by your health care provider. Ask your health care provider what activities are safe for you. °General Instructions °· Take over-the-counter and prescription medicines only as told by your health care provider. °· Avoid certain positions or movements as told by your health care provider. °· Drink enough fluid to keep your urine clear or pale yellow. °· Keep all follow-up visits as told by your health care provider. This is important. °SEEK MEDICAL CARE IF: °· You have a fever. °· Your condition gets worse or you develop new symptoms. °· Your family or friends   notice any behavioral changes.  Your nausea or vomiting gets worse.  You have numbness or a "pins and needles" sensation. SEEK IMMEDIATE MEDICAL CARE IF:  You have difficulty speaking or moving.  You are always dizzy.  You faint.  You develop severe headaches.  You have weakness in your  legs or arms.  You have changes in your hearing or vision.  You develop a stiff neck.  You develop sensitivity to light.   This information is not intended to replace advice given to you by your health care provider. Make sure you discuss any questions you have with your health care provider.   Follow-up with your primary care provider for reevaluation. Take meclizine as needed for vertigo and dizziness. Return to the emergency department if you experience severe worsening of your symptoms, loss of consciousness, blurry vision, vomiting, severe headache or neck pain.

## 2016-01-07 NOTE — Telephone Encounter (Signed)
Answered the call from [facility], [place] wife, she told pt fell, hit his head and feels dizzy. Dizziness is off and on. Also mentioned .Marland KitchenMarland KitchenMarland KitchenMillikan saw subject few weeks ago and told he has vertigo. I requested the call back number and told her to wait so i can call dr. Leonie Man for his opinion.  After talking to Dr. Leonie Man told the subject to go to ED.

## 2016-01-07 NOTE — Telephone Encounter (Signed)
I returned the patient's spouse's call. She stated that the patient woke up this morning with dizziness and fell as he was getting up. She stated that the patient bumped his head when he fell, then had breakfast, and he seems to be fine now. Dr. Leonie Man was informed of these symptoms, and he advised the patient to go to the Urgent Care or to the Emergency Room. I relayed this message to the patient's spouse, and she stated that she would take the patient to the ER. I told her that we will monitor the patient's progress. She voiced understanding and appreciation.

## 2016-01-07 NOTE — ED Notes (Signed)
Pt reports he was getting out of bed and fell hitting the right side of his head on the end table. States he is in a clinical trial at [facility] for vertigo at this time.

## 2016-01-07 NOTE — ED Provider Notes (Signed)
CSN: [id number]     Arrival date & time 01/07/16  1320 History   First MD Initiated Contact with Patient 01/07/16 1611     Chief Complaint  Patient presents with  . Fall     (Consider location/radiation/quality/duration/timing/severity/associated sxs/prior Treatment) HPI   Cesar [name] is a 80 y.o M with a past medical history of HTN, HLD, Alzheimer's who presents the emergency department today to be evaluated after a fall. Patient states that he got out of bed this morning around 7 AM felt very dizzy and off balance and fell backwards striking his head on the coffee table. Denies loss of consciousness. Patient with hematuria after the fall and got back in bed and lay down for several hours. Patient's wife states that patient is on a clinical [facility] for memory impairment. She contacted the neurology office told her that he had fallen and they requested that he come to the ER to be evaluated. Patient endorses a dull headache at the site of the impact. Denies weakness, dizziness, blurry vision, vomiting, neck pain, back pain. Patient is not on blood thinners.   Past Medical History  Diagnosis Date  . Depression   . Hyperlipidemia   . Hypertension   . Autoimmune hepatitis ([place])   . Coronary atherosclerosis of unspecified type of vessel, native or graft   . Peripheral vascular disease, unspecified ([place])   . Occlusion and stenosis of carotid artery without mention of cerebral infarction   . Unspecified hemorrhoids without mention of complication   . Osteoarthrosis, unspecified whether generalized or localized, unspecified site   . Other and unspecified hyperlipidemia   . AAA (abdominal aortic aneurysm) ([place])   . Cancer ([place])     skin  . Memory deficits 09/23/2013  . Cervicogenic headache   . Hx of cardiovascular stress test     LexiScan Myoview (12/14):  No ischemia, EF 59%, normal study.  . ILD (interstitial lung disease) ([place])   . Arthritis     Past Surgical History  Procedure Laterality Date  . Lumbar disc surgery    . Triple a indo vascular graft  8/09  . Polypectomy    . Appendectomy    . Tonsillectomy    . Skin cancer excision    . Coronary artery bypass graft  2001    x4  . Spine surgery    . Abdominal aortic aneurysm repair  Nov. 15, 2011    EVAR    Family History  Problem Relation Age of Onset  . Colon cancer Neg Hx   . Lung disease Neg Hx   . Rheumatologic disease Neg Hx   . Colon polyps Father   . Heart disease Mother     After age 74  . Hyperlipidemia Mother   . Heart attack Mother   . Multiple sclerosis Sister   . Diabetes Sister   . Heart disease Paternal Grandmother    Social History  Substance Use Topics  . Smoking status: Former Smoker -- 1.00 packs/day for 40 years    Types: Cigarettes    Start date: 04/25/1960    Quit date: 10/18/1999  . Smokeless tobacco: Never Used     Comment: quit 1970  . Alcohol Use: 0.0 oz/week    0 Standard drinks or equivalent per week     Comment: rare alcohol    Review of Systems  All other systems reviewed and are negative.     Allergies  Review of patient's allergies indicates  no known allergies.  Home Medications   Prior to Admission medications   Medication Sig Start Date End Date Taking? Authorizing Provider  ascorbic acid (VITAMIN C) 250 MG CHEW Chew 250 mg by mouth 2 (two) times daily.    Historical Provider, MD  aspirin 81 MG tablet Take 81 mg by mouth daily.      Historical Provider, MD  atenolol (TENORMIN) 25 MG tablet Take 25 mg by mouth daily.      Historical Provider, MD  Cetirizine HCl (ZYRTEC ALLERGY PO) Take 10 mg by mouth daily.    Historical Provider, MD  donepezil (ARICEPT) 5 MG tablet TAKE 1 TABLET (5 MG TOTAL) BY MOUTH AT BEDTIME. 08/20/14   Ward Givens, NP  fenofibrate 160 MG tablet Take 160 mg by mouth daily.      Historical Provider, MD  memantine (NAMENDA) 10 MG tablet Take 1 tablet (10 mg total) by mouth 2 (two) times  daily. 08/20/14   Ward Givens, NP  Multiple Vitamin (MULTIVITAMIN WITH MINERALS) TABS tablet Take 1 tablet by mouth daily.    Historical Provider, MD  NITROSTAT 0.4 MG SL tablet DISSOLVE 1 TABLET UNDER THETONGUE AS NEEDED 03/13/13   Minus Breeding, MD  sertraline (ZOLOFT) 50 MG tablet Take 50 mg by mouth daily.      Historical Provider, MD  traMADol (ULTRAM) 50 MG tablet Take 1 tablet (50 mg total) by mouth every 6 (six) hours as needed for severe pain. Take 1 or 2 tablets as needed three times daily 12/25/15   Garvin Fila, MD   BP 132/80 mmHg  Pulse 64  Temp(Src) 97.1 F (36.2 C) (Oral)  Resp 18  Wt 82.101 kg  SpO2 99% Physical Exam  Constitutional: He is oriented to person, place, and time. He appears well-developed and well-nourished. No distress.  HENT:  Head: Normocephalic.  Mouth/Throat: No oropharyngeal exudate.  Small area of ecchymoses and right frontal scalp. No skull depression or sign of skull fracture. No battle sign. No raccoon eyes.  Eyes: Conjunctivae and EOM are normal. Pupils are equal, round, and reactive to light. Right eye exhibits no discharge. Left eye exhibits no discharge. No scleral icterus.  Neck: Normal range of motion. Neck supple.  No midline cervical spinal tenderness.  Cardiovascular: Normal rate, regular rhythm, normal heart sounds and intact distal pulses.  Exam reveals no gallop and no friction rub.   No murmur heard. Pulmonary/Chest: Effort normal and breath sounds normal. No respiratory distress. He has no wheezes. He has no rales. He exhibits no tenderness.  Abdominal: Soft. He exhibits no distension. There is no tenderness. There is no guarding.  Musculoskeletal: Normal range of motion. He exhibits no edema.  Lymphadenopathy:    He has no cervical adenopathy.  Neurological: He is alert and oriented to person, place, and time. He displays normal reflexes. No cranial nerve deficit. He exhibits normal muscle tone. Coordination normal.  Strength 5/5  throughout. No sensory deficits.  No gait abnormality. Normal finger to nose.  Skin: Skin is warm and dry. No rash noted. He is not diaphoretic. No erythema. No pallor.  Psychiatric: He has a normal mood and affect. His behavior is normal.  Nursing note and vitals reviewed.   ED Course  Procedures (including critical care time) Labs Review Labs Reviewed - No data to display  Imaging Review Ct Head Wo Contrast  01/07/2016  CLINICAL DATA:  Status post fall, hit his head EXAM: CT HEAD WITHOUT CONTRAST CT CERVICAL SPINE WITHOUT CONTRAST TECHNIQUE:  Multidetector CT imaging of the head and cervical spine was performed following the standard protocol without intravenous contrast. Multiplanar CT image reconstructions of the cervical spine were also generated. COMPARISON:  MR brain 12/31/2015 FINDINGS: CT HEAD FINDINGS There is no evidence of mass effect, midline shift, or extra-axial fluid collections. There is no evidence of a space-occupying lesion or intracranial hemorrhage. There is no evidence of a cortical-based area of acute infarction. There is generalized cerebral atrophy. There is periventricular white matter low attenuation likely secondary to microangiopathy. The ventricles and sulci are appropriate for the patient's age. The basal cisterns are patent. Visualized portions of the orbits are unremarkable. The visualized portions of the paranasal sinuses and mastoid air cells are unremarkable. Cerebrovascular atherosclerotic calcifications are noted. The osseous structures are unremarkable. CT CERVICAL SPINE FINDINGS The alignment is anatomic. The vertebral body heights are maintained. There is no acute fracture. There is no static listhesis. The prevertebral soft tissues are normal. The intraspinal soft tissues are not fully imaged on this examination due to poor soft tissue contrast, but there is no gross soft tissue abnormality. There is osseous fusion of the C4-5 vertebral bodies. There is  degenerative disc disease with disc height loss at C6-7 and bilateral uncovertebral degenerative changes. There is right facet arthropathy at C3-4 with foraminal narrowing. There is bilateral facet arthropathy at C5-6. The visualized portions of the lung apices demonstrate no focal abnormality. There is bilateral carotid artery atherosclerosis. IMPRESSION: 1. No acute intracranial pathology. 2. No acute osseous injury of the cervical spine. Electronically Signed   By: Kathreen Devoid   On: 01/07/2016 19:10   Ct Cervical Spine Wo Contrast  01/07/2016  CLINICAL DATA:  Status post fall, hit his head EXAM: CT HEAD WITHOUT CONTRAST CT CERVICAL SPINE WITHOUT CONTRAST TECHNIQUE: Multidetector CT imaging of the head and cervical spine was performed following the standard protocol without intravenous contrast. Multiplanar CT image reconstructions of the cervical spine were also generated. COMPARISON:  MR brain 12/31/2015 FINDINGS: CT HEAD FINDINGS There is no evidence of mass effect, midline shift, or extra-axial fluid collections. There is no evidence of a space-occupying lesion or intracranial hemorrhage. There is no evidence of a cortical-based area of acute infarction. There is generalized cerebral atrophy. There is periventricular white matter low attenuation likely secondary to microangiopathy. The ventricles and sulci are appropriate for the patient's age. The basal cisterns are patent. Visualized portions of the orbits are unremarkable. The visualized portions of the paranasal sinuses and mastoid air cells are unremarkable. Cerebrovascular atherosclerotic calcifications are noted. The osseous structures are unremarkable. CT CERVICAL SPINE FINDINGS The alignment is anatomic. The vertebral body heights are maintained. There is no acute fracture. There is no static listhesis. The prevertebral soft tissues are normal. The intraspinal soft tissues are not fully imaged on this examination due to poor soft tissue contrast,  but there is no gross soft tissue abnormality. There is osseous fusion of the C4-5 vertebral bodies. There is degenerative disc disease with disc height loss at C6-7 and bilateral uncovertebral degenerative changes. There is right facet arthropathy at C3-4 with foraminal narrowing. There is bilateral facet arthropathy at C5-6. The visualized portions of the lung apices demonstrate no focal abnormality. There is bilateral carotid artery atherosclerosis. IMPRESSION: 1. No acute intracranial pathology. 2. No acute osseous injury of the cervical spine. Electronically Signed   By: Kathreen Devoid   On: 01/07/2016 19:10   I have personally reviewed and evaluated these images and lab results as part of  my medical decision-making.   EKG Interpretation None      MDM   Final diagnoses:  Fall, initial encounter  Vertigo     80 y.o M Presents to the ED for evaluation after a fall that occurred early this morning, greater than 5 hours PTA. No LOC. Patient states that he woke up with his typical vertigo like symptoms, dizziness and became off balance and fell. Denies lightheadedness or near syncope. Patient appears well in ED. No neurological deficits. Patient is not anticoagulated. CT reveals no acute intracranial pathology. No sign of intracranial bleed or skull fracture. The patient. Patient is ambulatory in ED without difficulty. No dizziness upon standing or gait disturbance. Patient is currently in a clinical trial for memory impairment and vertigo with [facility] neurology. Patient may follow up with them as an outpatient. Return precautions outlined in patient discharge instructions.   Patient was discussed with  Dr. Wilson Singer who agrees with the treatment plan.     Dondra Spry [employer], PA-C 01/08/16 1727  Virgel Manifold, MD 01/12/16 1041

## 2016-01-11 NOTE — Telephone Encounter (Signed)
Agree with plan 

## 2016-01-21 ENCOUNTER — Ambulatory Visit (INDEPENDENT_AMBULATORY_CARE_PROVIDER_SITE_OTHER): Payer: Medicare Other | Admitting: Pulmonary Disease

## 2016-01-21 VITALS — BP 120/60 | HR 77

## 2016-01-21 DIAGNOSIS — I6523 Occlusion and stenosis of bilateral carotid arteries: Secondary | ICD-10-CM | POA: Diagnosis not present

## 2016-01-21 DIAGNOSIS — J849 Interstitial pulmonary disease, unspecified: Secondary | ICD-10-CM | POA: Diagnosis not present

## 2016-01-21 DIAGNOSIS — R911 Solitary pulmonary nodule: Secondary | ICD-10-CM

## 2016-01-21 DIAGNOSIS — R059 Cough, unspecified: Secondary | ICD-10-CM

## 2016-01-21 DIAGNOSIS — R05 Cough: Secondary | ICD-10-CM

## 2016-01-21 DIAGNOSIS — IMO0001 Reserved for inherently not codable concepts without codable children: Secondary | ICD-10-CM

## 2016-01-21 MED ORDER — BENZONATATE 100 MG PO CAPS: 100.0000 mg | ORAL_CAPSULE | Freq: Three times a day (TID) | ORAL | Status: AC | PRN

## 2016-01-21 NOTE — Progress Notes (Signed)
Subjective:    Patient ID: Cesar [name], male    DOB: 1935-12-09, 80 y.o.   MRN: [id number]  [facility]  Acute visit for Cough with known ILD & RUL Nodule.  HPI Cough:  He started having a cough Tuesday evening with a headache. Has a nonproductive cough. No sore throat or sinus congestion. No sick contacts. He has coughed less today and seems to be improving. Influenza DFA negative.  ILD:  Autoimmune workup negative.  Reports with his cough his dyspnea has increased but is intermittent. Pattern on HRCT is Atypical UIP.   RUL Nodule:  3 mm and seen on CT imaging January 2017. Stable on HRCT of the chest in March 2017.  Review of Systems He report some pain in his lower back & ribs with his coughing. No nausea or diarrhea. He has had near syncope. Denies any chest tightness. Last night he had a temperature of 101.40F. Denies any chills or sweats.   No Known Allergies  Current Outpatient Prescriptions on File Prior to Visit  Medication Sig Dispense Refill  . ascorbic acid (VITAMIN C) 250 MG CHEW Chew 250 mg by mouth 2 (two) times daily.    Marland Kitchen aspirin 81 MG tablet Take 81 mg by mouth daily.      . Cetirizine HCl (ZYRTEC ALLERGY PO) Take 10 mg by mouth daily.    Marland Kitchen donepezil (ARICEPT) 5 MG tablet TAKE 1 TABLET (5 MG TOTAL) BY MOUTH AT BEDTIME. 30 tablet 3  . fenofibrate 160 MG tablet Take 160 mg by mouth daily.      . meclizine (ANTIVERT) 12.5 MG tablet Take 1 tablet (12.5 mg total) by mouth 3 (three) times daily as needed for dizziness. 30 tablet 0  . memantine (NAMENDA) 10 MG tablet Take 1 tablet (10 mg total) by mouth 2 (two) times daily. 60 tablet 3  . Multiple Vitamin (MULTIVITAMIN WITH MINERALS) TABS tablet Take 1 tablet by mouth daily.    Marland Kitchen NITROSTAT 0.4 MG SL tablet DISSOLVE 1 TABLET UNDER THETONGUE AS NEEDED 25 tablet 1  . sertraline (ZOLOFT) 50 MG tablet Take 50 mg by mouth daily.      . traMADol (ULTRAM) 50 MG tablet Take 1 tablet (50 mg total) by mouth every 6 (six) hours as  needed for severe pain. Take 1 or 2 tablets as needed three times daily 30 tablet 1   No current facility-administered medications on file prior to visit.    Past Medical History  Diagnosis Date  . Depression   . Hyperlipidemia   . Hypertension   . Autoimmune hepatitis ([place])   . Coronary atherosclerosis of unspecified type of vessel, native or graft   . Peripheral vascular disease, unspecified ([place])   . Occlusion and stenosis of carotid artery without mention of cerebral infarction   . Unspecified hemorrhoids without mention of complication   . Osteoarthrosis, unspecified whether generalized or localized, unspecified site   . Other and unspecified hyperlipidemia   . AAA (abdominal aortic aneurysm) ([name])   . Cancer ([place])     skin  . Memory deficits 09/23/2013  . Cervicogenic headache   . Hx of cardiovascular stress test     LexiScan Myoview (12/14):  No ischemia, EF 59%, normal study.  . ILD (interstitial lung disease) ([place])   . Arthritis     Past Surgical History  Procedure Laterality Date  . Lumbar disc surgery    . Triple a indo vascular graft  8/09  . Polypectomy    .  Appendectomy    . Tonsillectomy    . Skin cancer excision    . Coronary artery bypass graft  2001    x4  . Spine surgery    . Abdominal aortic aneurysm repair  Nov. 15, 2011    EVAR     Family History  Problem Relation Age of Onset  . Colon cancer Neg Hx   . Lung disease Neg Hx   . Rheumatologic disease Neg Hx   . Colon polyps Father   . Heart disease Mother     After age 86  . Hyperlipidemia Mother   . Heart attack Mother   . Multiple sclerosis Sister   . Diabetes Sister   . Heart disease Paternal Grandmother     Social History   Social History  . Marital Status: Married    Spouse Name: [name]  . Number of Children: 4  . Years of Education: DOCTORATE   Occupational History  . Retired     Theme park manager   Social History Main Topics  . Smoking status: Former Smoker -- 1.00 packs/day for 40  years    Types: Cigarettes    Start date: 04/25/1960    Quit date: 10/18/1999  . Smokeless tobacco: Never Used     Comment: quit 1970  . Alcohol Use: 0.0 oz/week    0 Standard drinks or equivalent per week     Comment: rare alcohol  . Drug Use: No  . Sexual Activity: Not on file   Other Topics Concern  . Not on file   Social History Narrative   Patient lives at home with wife [name].    Patient has 4 children.    Patient is retired.   Patient has a college education.       [facility] [facility]:   Originally from [place], [place]. Has also lived in [place], [place], [place], [place]. 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 No pets currently. Had a [place] with his children and a second [place] in [place] just prior to 1985. No mold, asbestos, or hot tub exposure. He has done wood working with treated lumber but always domestic wood. No metal working.       Objective:   Physical Exam BP 120/60 mmHg  Pulse 77  SpO2 93% General:  Awake. Alert. No distress. Wife accompanying patient today. Integument:  Warm & dry. No rash on exposed skin. No bruising.  Lymphatics:  No appreciated cervical or supraclavicular lymphadenoapthy. HEENT:  Moist mucus membranes. No oral ulcers. No scleral injections.  Cardiovascular:  Regular rate. No edema. No appreciable JVD.  Pulmonary:  Basilar predominant Velcro crackles that progressively less and towards the apices. Good aeration bilaterally with normal work of breathing. Intermittent nonproductive cough witnessed. Abdomen: Soft. Normal bowel sounds. Nondistended. Nontender.  PFT 11/03/15: FVC 3.40 L (78%) FEV1 2.44 L (78%) FEV1/FVC 0.72 FEF 25-75 1.60 L (72%) TLC 4.62 L (63%) RV 46% ERV 109% DLCO uncorrected 32%  IMAGING HRCT CHEST W/O 12/21/15 (personally  reviewed by me): Atypical UIP pattern with basilar predominance subpleural intralobular septal thickening and honeycombing changes. No evidence for groundglass opacities. No pleural effusion or thickening. No pericardial effusion. No mediastinal mass. 91m right upper lobe nodule unchanged. CT CHEST W/O 11/06/15 (previously reviewed by me): Basilar predominant subpleural  intralobular septal thickening. Suggestion of subpleural honeycomb changes as well. No gross bronchiectasis. Calcified nodule in left face consistent with prior granulomatous. No identification of calcifications within spleen or liver. No pathologic mediastinal adenopathy. Minimal pleural thickening on the right without pleural effusion. 3 mm right apical nodule. These changes appear to be present on abdominal CT imaging going back to 2009.  LABS 12/16/15 COPD: 0.5 ESR: 34 ANA: Negative Rheumatoid factor: 16 Anti-CCP:  <16 SSA:  <1 SSB:  <1 SCL 70:  <1 Hypersensitivity pneumonitis panel: Negative  11/16/15 Pro-BNP:  354.5  ABG on RA (08/30/10): 7.40 / 37 / 88 / 97%  02/11/09 Alpha-1 antitrypsin: 189 ANA: Negative     Assessment & Plan:  16 -year-old Caucasian male probable viral URI precipitating his cough. His influenza DFA was negative. In the absence of other symptoms that would be strongly suggestive of influenza I believe this is an alternative viral etiology. I reviewed his high-resolution CT scan with him and his wife today as well as the results of his lab tests. I instructed the patient to contact my office if he was not significantly better on Monday as we would need to adjust his follow-up appointments and pulmonary function testing. At this time I am holding on initiating antibiotic therapy.  1. Viral URI: Recommend continued rest and symptomatic management with Tessalon Perles for cough suppression. 2. Interstitial lung disease/UIP Pattern: Already scheduled for repeat walk test and pulmonary function testing.  Holding on treatment with Ofev or Esbriet at this time. 3. Right upper lobe nodule: Measures 3 mm on CT imaging. Plan to discuss timing of repeat chest CT imaging at next appointment. 4. Health maintenance: Received influenza vaccine September 2016. 5. Follow-up: Patient will keep previously scheduled follow-up appointments later this month.  Sonia Baller Ashok Cordia, M.D. [facility] Pager:  [phone] After 3pm or if no response, call [phone] 3:19 PM 01/21/2016

## 2016-01-21 NOTE — Addendum Note (Signed)
Addended by: Len Blalock on: 01/21/2016 04:01 PM   Modules accepted: Orders

## 2016-01-21 NOTE — Patient Instructions (Addendum)
   Please call my office if you have any persistent fever, chills, or sweats.  Please call my office if your cough starts to produce any mucus.  Try taking one of the Tessalon Perles 3 times a day to help with your cough while you are sick and then use as needed.  We will keep your appointments as scheduled but if you don't feel significantly better on Monday. Please call and inform us so that we can move here follow-up appointments accordingly.

## 2016-01-27 ENCOUNTER — Ambulatory Visit (INDEPENDENT_AMBULATORY_CARE_PROVIDER_SITE_OTHER): Payer: Medicare Other | Admitting: Pulmonary Disease

## 2016-01-27 DIAGNOSIS — J849 Interstitial pulmonary disease, unspecified: Secondary | ICD-10-CM | POA: Diagnosis not present

## 2016-01-27 DIAGNOSIS — R0609 Other forms of dyspnea: Secondary | ICD-10-CM

## 2016-01-27 LAB — PULMONARY FUNCTION TEST
FEF 25-75 PRE: 1.39 L/s
FEF2575-%Pred-Pre: 64 %
FEV1-%Pred-Pre: 76 %
FEV1-Pre: 2.33 L
FEV1FVC-%PRED-PRE: 97 %
FEV6-%Pred-Pre: 83 %
FEV6-PRE: 3.32 L
FEV6FVC-%Pred-Pre: 106 %
FVC-%PRED-PRE: 78 %
PRE FEV6/FVC RATIO: 100 %
Pre FEV1/FVC ratio: 70 %

## 2016-01-27 NOTE — Progress Notes (Signed)
PFT done today. 01/27/2016 

## 2016-01-28 NOTE — Progress Notes (Signed)
6MWT 01/27/16:  Walked 405 meters / Baseline sat 96% on RA / Nadir Sat 92% on RA

## 2016-02-01 ENCOUNTER — Telehealth: Payer: Self-pay | Admitting: Neurology

## 2016-02-01 NOTE — Telephone Encounter (Signed)
Patient states he is calling back about the infusion and his headaches. He gave no further information.  Thanks!

## 2016-02-02 ENCOUNTER — Telehealth: Payer: Self-pay

## 2016-02-02 ENCOUNTER — Other Ambulatory Visit: Payer: Self-pay | Admitting: Neurology

## 2016-02-02 MED ORDER — TOPIRAMATE 50 MG PO TABS: 100.0000 mg | ORAL_TABLET | Freq: Two times a day (BID) | ORAL | Status: DC

## 2016-02-02 NOTE — Telephone Encounter (Signed)
I spoke to the patient about his headaches which seem to have started after the infusions. Tramadol helps but knocks him out and headaches return. I recommend he try Topamax 50 mg daily for 3 days followed by twice daily and I prescribed.it electronically.Marland Kitchen He was advised to keep his scheduled appointment for the next infusion next week. She voiced understanding.

## 2016-02-02 NOTE — Telephone Encounter (Signed)
I spoke to the patient's wife, Mickael Napoles, in re the CREAD study. She stated that the patient still has headaches, and even though the patient was prescribed traMADol (ULTRAM) 50 MG tablet, she states that "tramadol knocks him out and the next day he still has the headaches." I told her that I would move the patient's appointment from [id number] to [id number] at 13:00h, so the patient can see Dr. Leonie Man and they can discuss whether to proceed with the infusions. She agreed and the patient was rescheduled.

## 2016-02-03 ENCOUNTER — Telehealth: Payer: Self-pay

## 2016-02-03 NOTE — Telephone Encounter (Signed)
I will call pharmacy and clarify

## 2016-02-03 NOTE — Telephone Encounter (Signed)
Fax receive from pharmacy about new topamax prescription. The pharmacy needs clarification on the orders. The directions stated take 2 tablets 100 by mouth 2 times daily. Start 1 tablet at night daily x3 days then one tablet twice a daily.

## 2016-02-04 NOTE — Telephone Encounter (Signed)
I spoke to the patient's spouse, Stefano Kulzer, in re the CREAD study. I told her that Dr. Leonie Man had called the pharmacy to clarify the Rx for the patient. I told her that the Rx was ready to be picked up. [name] stated that she was called by the pharmacy and she picked it up on [phone]. She expressed appreciation.

## 2016-02-08 ENCOUNTER — Encounter (INDEPENDENT_AMBULATORY_CARE_PROVIDER_SITE_OTHER): Payer: Self-pay

## 2016-02-08 DIAGNOSIS — Z0289 Encounter for other administrative examinations: Secondary | ICD-10-CM

## 2016-02-09 ENCOUNTER — Ambulatory Visit: Payer: Medicare Other | Admitting: Pulmonary Disease

## 2016-02-11 ENCOUNTER — Ambulatory Visit (INDEPENDENT_AMBULATORY_CARE_PROVIDER_SITE_OTHER): Payer: Medicare Other | Admitting: Pulmonary Disease

## 2016-02-11 ENCOUNTER — Encounter: Payer: Self-pay | Admitting: Pulmonary Disease

## 2016-02-11 VITALS — BP 128/62 | HR 72 | Ht 71.0 in | Wt 175.8 lb

## 2016-02-11 DIAGNOSIS — I6523 Occlusion and stenosis of bilateral carotid arteries: Secondary | ICD-10-CM

## 2016-02-11 DIAGNOSIS — J849 Interstitial pulmonary disease, unspecified: Secondary | ICD-10-CM | POA: Diagnosis not present

## 2016-02-11 DIAGNOSIS — IMO0001 Reserved for inherently not codable concepts without codable children: Secondary | ICD-10-CM

## 2016-02-11 DIAGNOSIS — R911 Solitary pulmonary nodule: Secondary | ICD-10-CM

## 2016-02-11 NOTE — Patient Instructions (Signed)
   Call me if you have any new breathing problems or worsening of your cough before your next appointment.  He will have a breathing and walking test at his next appointment  I will see him back in 3 months or sooner if needed  TESTS ORDERED: 1. Spirometry with DLCO at next appointment 2. Six-minute walk test on room air at next appointment

## 2016-02-11 NOTE — Progress Notes (Signed)
Subjective:    Patient ID: Cesar [name], male    DOB: Jan 20, 1936, 80 y.o.   MRN: [id number]  C.C.:  Follow-up  for known ILD (Atypical UIP) & RUL Nodule.  HPI ILD:  Autoimmune workup negative. Pattern on HRCT is Atypical UIP. His cough has improved significantly. Cough is now only rare/intermittent. Cough remains nonproductive.   RUL Nodule:  3 mm and seen on CT imaging January 2017. Stable on HRCT of the chest in March 2017.  Review of Systems No subjective fever, chills, or sweats. No chest pain or pressure. No nausea, emesis, abdominal pain. No reflux or dyspepsia.   No Known Allergies  Current Outpatient Prescriptions on File Prior to Visit  Medication Sig Dispense Refill  . ascorbic acid (VITAMIN C) 250 MG CHEW Chew 250 mg by mouth 2 (two) times daily.    Marland Kitchen aspirin 81 MG tablet Take 81 mg by mouth daily.      . benzonatate (TESSALON) 100 MG capsule Take 1-2 capsules (100-200 mg total) by mouth 3 (three) times daily as needed for cough. 90 capsule 0  . Cetirizine HCl (ZYRTEC ALLERGY PO) Take 10 mg by mouth daily.    Marland Kitchen donepezil (ARICEPT) 5 MG tablet TAKE 1 TABLET (5 MG TOTAL) BY MOUTH AT BEDTIME. 30 tablet 3  . fenofibrate 160 MG tablet Take 160 mg by mouth daily.      . meclizine (ANTIVERT) 12.5 MG tablet Take 1 tablet (12.5 mg total) by mouth 3 (three) times daily as needed for dizziness. 30 tablet 0  . memantine (NAMENDA) 10 MG tablet Take 1 tablet (10 mg total) by mouth 2 (two) times daily. 60 tablet 3  . Multiple Vitamin (MULTIVITAMIN WITH MINERALS) TABS tablet Take 1 tablet by mouth daily.    Marland Kitchen NITROSTAT 0.4 MG SL tablet DISSOLVE 1 TABLET UNDER THETONGUE AS NEEDED 25 tablet 1  . sertraline (ZOLOFT) 50 MG tablet Take 50 mg by mouth daily.      Marland Kitchen topiramate (TOPAMAX) 50 MG tablet Take 2 tablets (100 mg total) by mouth 2 (two) times daily. Start 1tablet at night daily x 3 days then one tablet twice daily 60 tablet 1  . traMADol (ULTRAM) 50 MG tablet Take 1 tablet (50 mg  total) by mouth every 6 (six) hours as needed for severe pain. Take 1 or 2 tablets as needed three times daily 30 tablet 1   No current facility-administered medications on file prior to visit.    Past Medical History  Diagnosis Date  . Depression   . Hyperlipidemia   . Hypertension   . Autoimmune hepatitis ([place])   . Coronary atherosclerosis of unspecified type of vessel, native or graft   . Peripheral vascular disease, unspecified ([place])   . Occlusion and stenosis of carotid artery without mention of cerebral infarction   . Unspecified hemorrhoids without mention of complication   . Osteoarthrosis, unspecified whether generalized or localized, unspecified site   . Other and unspecified hyperlipidemia   . AAA (abdominal aortic aneurysm) ([place])   . Cancer ([place])     skin  . Memory deficits 09/23/2013  . Cervicogenic headache   . Hx of cardiovascular stress test     LexiScan Myoview (12/14):  No ischemia, EF 59%, normal study.  . ILD (interstitial lung disease) ([place])   . Arthritis     Past Surgical History  Procedure Laterality Date  . Lumbar disc surgery    . Triple a indo vascular graft  8/09  .  Polypectomy    . Appendectomy    . Tonsillectomy    . Skin cancer excision    . Coronary artery bypass graft  2001    x4  . Spine surgery    . Abdominal aortic aneurysm repair  Nov. 15, 2011    EVAR     Family History  Problem Relation Age of Onset  . Colon cancer Neg Hx   . Lung disease Neg Hx   . Rheumatologic disease Neg Hx   . Colon polyps Father   . Heart disease Mother     After age 37  . Hyperlipidemia Mother   . Heart attack Mother   . Multiple sclerosis Sister   . Diabetes Sister   . Heart disease Paternal Grandmother     Social History   Social History  . Marital Status: Married    Spouse Name: [name]  . Number of Children: 4  . Years of Education: DOCTORATE   Occupational History  . Retired     Theme park manager   Social History Main Topics  . Smoking status:  Former Smoker -- 1.00 packs/day for 40 years    Types: Cigarettes    Start date: 04/25/1960    Quit date: 10/18/1999  . Smokeless tobacco: Never Used     Comment: quit 1970  . Alcohol Use: 0.0 oz/week    0 Standard drinks or equivalent per week     Comment: rare alcohol  . Drug Use: No  . Sexual Activity: Not Asked   Other Topics Concern  . None   Social History Narrative   Patient lives at home with wife [name].    Patient has 4 children.    Patient is retired.   Patient has a college education.       [facility]:   Originally from [place], [place]. Has also lived in [place], [place], [place], [place]. 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 No pets currently. Had a [place] with his children and a second [place] in [place] just prior to 1985. No mold, asbestos, or hot tub exposure. He has done wood working with treated lumber but always domestic wood. No metal working.       Objective:   Physical Exam BP 128/62 mmHg  Pulse 72  Ht '5\' 11"'  (1.803 m)  Wt 175 lb 12.8 oz (79.742 kg)  BMI 24.53 kg/m2  SpO2 96% General:  Awake. Alert. No distress.  Integument:  Warm & dry. No rash on exposed skin.  Lymphatics:  No appreciated cervical or supraclavicular lymphadenoapthy. HEENT:  Moist mucus membranes. No oral ulcers. No nasal turbinate swelling. Cardiovascular:  Regular rate. No edema. No appreciable JVD.  Pulmonary:  Basilar Velcro crackles unchanged. Normal work of breathing on room air. Speaking in complete sentences.  Abdomen: Soft. Normal bowel sounds. Nondistended. Nontender.  PFT 01/27/16: FVC 3.32 L (78%) FEV1 2.33 L (76%) FEV1/FVC 0.70 FEF 25-75 1.39 L (64%) (unable to obtain DLCO due to cough) 11/03/15: FVC 3.40 L (78%) FEV1 2.44 L (78%) FEV1/FVC 0.72 FEF 25-75 1.60 L  (72%) TLC 4.62 L (63%) RV 46% ERV 109% DLCO uncorrected 32%  6MWT 01/27/16: Walked 405 meters / Baseline sat 96% on RA / Nadir Sat 92% on RA  IMAGING HRCT CHEST W/O 12/21/15 (personally  reviewed by me): Atypical UIP pattern with basilar predominance subpleural intralobular septal thickening and honeycombing changes. No evidence for groundglass opacities. No pleural effusion or thickening. No pericardial effusion. No mediastinal mass. 10m right upper lobe nodule unchanged.  CT CHEST W/O 11/06/15 (previously reviewed by me): Basilar predominant subpleural intralobular septal thickening. Suggestion of subpleural honeycomb changes as well. No gross bronchiectasis. Calcified nodule in left face consistent with prior granulomatous. No identification of calcifications within spleen or liver. No pathologic mediastinal adenopathy. Minimal pleural thickening on the right without pleural effusion. 3 mm right apical nodule. These changes appear to be present on abdominal CT imaging going back to 2009.  LABS 12/16/15 CRP: 0.5 ESR: 34 ANA: Negative Rheumatoid factor: 16 Anti-CCP:  <16 SSA:  <1 SSB:  <1 SCL 70:  <1 Hypersensitivity pneumonitis panel: Negative  11/16/15 Pro-BNP:  354.5  ABG on RA (08/30/10): 7.40 / 37 / 88 / 97%  02/11/09 Alpha-1 antitrypsin: 189 ANA: Negative     Assessment & Plan:  89-year-old Caucasian male underlying ILD. Continues to have minimal symptoms. No significant desaturation on his prior 6 minute walk test. His spirometry has decreased significantly since previous testing but this was during acute illness which is likely the cause for his decline. We discussed initiating treatment with Ofv or Esbriet but given his history of transaminitis and the potential for negative side effects the patient and I do not feel it's necessary at this time. Additionally, I do not feel that a surgical lung biopsy will be of great benefit the patient either. I instructed the patient and his  wife to contact my office if he had any new breathing problems before his next appointment.  1. Interstitial lung disease/Atypical UIP Pattern: Continuing to hold on treatment with Ofev or Esbriet at this time. Holding on surgical lung biopsy. Repeat spirometry with DLCO & 6 minute walk test at next appointment. 2. Right upper lobe nodule: Measures 3 mm on CT imaging. Plan for repeat CT imaging in January 2017. 3. Health maintenance: Received influenza vaccine September 2016, Prevnar 2015, & Pneumovax 2007.  4. Follow-up: Return to clinic in 3 months or sooner if needed.  JSonia BallerNAshok Cordia M.D. [facility] Center[facility] Care Pager:  [phone] 3pm or if no response, call [phone] 3:33 PM 02/11/2016

## 2016-02-16 ENCOUNTER — Telehealth: Payer: Self-pay

## 2016-02-16 NOTE — Telephone Encounter (Signed)
-----   Message from Algernon Huxley, RN sent at 08/20/2015  8:42 AM EDT ----- Regarding: LFT's Pt needs LFT's in 6 mth, order in epic.

## 2016-02-16 NOTE — Telephone Encounter (Signed)
Pt aware, order in epic. 

## 2016-02-17 ENCOUNTER — Other Ambulatory Visit (INDEPENDENT_AMBULATORY_CARE_PROVIDER_SITE_OTHER): Payer: Medicare Other

## 2016-02-17 DIAGNOSIS — R7989 Other specified abnormal findings of blood chemistry: Secondary | ICD-10-CM | POA: Diagnosis not present

## 2016-02-17 DIAGNOSIS — R945 Abnormal results of liver function studies: Principal | ICD-10-CM

## 2016-02-17 LAB — HEPATIC FUNCTION PANEL
ALBUMIN: 3.9 g/dL (ref 3.5–5.2)
ALK PHOS: 49 U/L (ref 39–117)
ALT: 22 U/L (ref 0–53)
AST: 43 U/L — AB (ref 0–37)
BILIRUBIN DIRECT: 0.1 mg/dL (ref 0.0–0.3)
Total Bilirubin: 0.4 mg/dL (ref 0.2–1.2)
Total Protein: 8 g/dL (ref 6.0–8.3)

## 2016-02-18 ENCOUNTER — Other Ambulatory Visit: Payer: Self-pay

## 2016-02-18 DIAGNOSIS — R945 Abnormal results of liver function studies: Principal | ICD-10-CM

## 2016-02-18 DIAGNOSIS — R7989 Other specified abnormal findings of blood chemistry: Secondary | ICD-10-CM

## 2016-03-07 ENCOUNTER — Other Ambulatory Visit: Payer: Self-pay | Admitting: Neurology

## 2016-03-07 DIAGNOSIS — F0390 Unspecified dementia without behavioral disturbance: Secondary | ICD-10-CM

## 2016-03-24 ENCOUNTER — Ambulatory Visit
Admission: RE | Admit: 2016-03-24 | Discharge: 2016-03-24 | Disposition: A | Discharge status: Home / Self Care | Payer: No Typology Code available for payment source | Source: Ambulatory Visit | Attending: Neurology | Admitting: Neurology

## 2016-03-24 DIAGNOSIS — F0390 Unspecified dementia without behavioral disturbance: Secondary | ICD-10-CM

## 2016-03-29 ENCOUNTER — Telehealth: Payer: Self-pay

## 2016-03-29 NOTE — Telephone Encounter (Signed)
-----   Message from Garvin Fila, MD sent at 03/26/2016  2:47 PM EDT ----- Mitchell Heir inform patient that brain MRI showed mild shrinkage of brain and hardening of arteries. No surprising or worrisome findings

## 2016-03-29 NOTE — Telephone Encounter (Signed)
Rn call patients wife Cesar [name] on [place] list. Rn stated per Dr.Sethi note the mri of the brain showed mild shrinkage of the brain and hardening of arteries. NO worrisome or surprising findings. PTs wife verbalized understanding.

## 2016-04-01 ENCOUNTER — Other Ambulatory Visit: Payer: Self-pay | Admitting: Neurology

## 2016-04-01 MED ORDER — TOPIRAMATE 50 MG PO TABS: 100.0000 mg | ORAL_TABLET | Freq: Two times a day (BID) | ORAL | Status: DC

## 2016-04-04 ENCOUNTER — Telehealth: Payer: Self-pay

## 2016-04-04 ENCOUNTER — Other Ambulatory Visit: Payer: Self-pay | Admitting: Neurology

## 2016-04-04 MED ORDER — TOPIRAMATE 50 MG PO TABS: 100.0000 mg | ORAL_TABLET | Freq: Every day | ORAL | Status: DC

## 2016-04-04 NOTE — Telephone Encounter (Signed)
Rn call [facility] about clarification on topamax. The pharmacist stated the order has 2 tablets by mouth daily, than one tablet twice daily. Rn stated the md is on vacation this week. Rn will see if the work in can do temp fill until Dr. Leonie Man comes back.

## 2016-04-05 NOTE — Telephone Encounter (Addendum)
Medication verified by Dr. Krista Blue the work in MD on 04/04/2016 for the am. Medication change to 2 tables daily  per order.

## 2016-05-16 ENCOUNTER — Ambulatory Visit (INDEPENDENT_AMBULATORY_CARE_PROVIDER_SITE_OTHER): Payer: Medicare Other | Admitting: Pulmonary Disease

## 2016-05-16 ENCOUNTER — Ambulatory Visit (INDEPENDENT_AMBULATORY_CARE_PROVIDER_SITE_OTHER): Payer: Self-pay | Admitting: Pulmonary Disease

## 2016-05-16 ENCOUNTER — Encounter: Payer: Self-pay | Admitting: Pulmonary Disease

## 2016-05-16 VITALS — BP 124/60 | HR 58 | Ht 70.5 in | Wt 169.0 lb

## 2016-05-16 DIAGNOSIS — R911 Solitary pulmonary nodule: Secondary | ICD-10-CM

## 2016-05-16 DIAGNOSIS — IMO0001 Reserved for inherently not codable concepts without codable children: Secondary | ICD-10-CM

## 2016-05-16 DIAGNOSIS — J849 Interstitial pulmonary disease, unspecified: Secondary | ICD-10-CM | POA: Diagnosis not present

## 2016-05-16 DIAGNOSIS — I6523 Occlusion and stenosis of bilateral carotid arteries: Secondary | ICD-10-CM

## 2016-05-16 DIAGNOSIS — R0600 Dyspnea, unspecified: Secondary | ICD-10-CM

## 2016-05-16 DIAGNOSIS — R0602 Shortness of breath: Secondary | ICD-10-CM | POA: Insufficient documentation

## 2016-05-16 LAB — PULMONARY FUNCTION TEST
DL/VA % PRED: 46 %
DL/VA: 2.16 ml/min/mmHg/L
DLCO COR: 11.17 ml/min/mmHg
DLCO cor % pred: 33 %
DLCO unc % pred: 33 %
DLCO unc: 11.17 ml/min/mmHg
FEF 25-75 PRE: 1.41 L/s
FEF 25-75 Post: 1.43 L/sec
FEF2575-%CHANGE-POST: 1 %
FEF2575-%PRED-POST: 67 %
FEF2575-%PRED-PRE: 66 %
FEV1-%Change-Post: 1 %
FEV1-%PRED-PRE: 80 %
FEV1-%Pred-Post: 81 %
FEV1-POST: 2.47 L
FEV1-PRE: 2.44 L
FEV1FVC-%CHANGE-POST: 2 %
FEV1FVC-%Pred-Pre: 97 %
FEV6-%CHANGE-POST: 0 %
FEV6-%PRED-PRE: 87 %
FEV6-%Pred-Post: 87 %
FEV6-PRE: 3.43 L
FEV6-Post: 3.44 L
FEV6FVC-%Change-Post: 1 %
FEV6FVC-%Pred-Post: 106 %
FEV6FVC-%Pred-Pre: 104 %
FVC-%CHANGE-POST: -1 %
FVC-%PRED-POST: 81 %
FVC-%PRED-PRE: 83 %
FVC-POST: 3.44 L
FVC-PRE: 3.49 L
POST FEV1/FVC RATIO: 72 %
POST FEV6/FVC RATIO: 100 %
PRE FEV1/FVC RATIO: 70 %
Pre FEV6/FVC Ratio: 98 %

## 2016-05-16 NOTE — Progress Notes (Signed)
Subjective:    Patient ID: Cesar [name], male    DOB: [birth year], 80 y.o.   MRN: [id number]  C.C.:  Follow-up  for known ILD (Atypical UIP) & RUL Nodule.  HPI ILD:  Autoimmune workup negative. Pattern on HRCT is Atypical UIP. He reports no change in his baseline dyspnea on exertion. Reports pervious cough resolved after being started on Loratadine & Flonase. No cough at all currently. No wheezing.    RUL Nodule:  3 mm and seen on CT imaging January 2017. Stable on HRCT of the chest in March 2017. Plan for repeat CT chest without contrast January 2018.  Review of Systems Denies any chest tightness, pressure, or pain. No fever, chills, or sweats. No rashes or bruising. This is questionable as the patient does have memory problems.   No Known Allergies  Current Outpatient Prescriptions on File Prior to Visit  Medication Sig Dispense Refill  . aspirin 81 MG tablet Take 81 mg by mouth daily.      . benzonatate (TESSALON) 100 MG capsule Take 1-2 capsules (100-200 mg total) by mouth 3 (three) times daily as needed for cough. 90 capsule 0  . donepezil (ARICEPT) 5 MG tablet TAKE 1 TABLET (5 MG TOTAL) BY MOUTH AT BEDTIME. 30 tablet 3  . fenofibrate 160 MG tablet Take 160 mg by mouth daily.      . meclizine (ANTIVERT) 12.5 MG tablet Take 1 tablet (12.5 mg total) by mouth 3 (three) times daily as needed for dizziness. 30 tablet 0  . memantine (NAMENDA) 10 MG tablet Take 1 tablet (10 mg total) by mouth 2 (two) times daily. 60 tablet 3  . Multiple Vitamin (MULTIVITAMIN WITH MINERALS) TABS tablet Take 1 tablet by mouth daily.    Marland Kitchen NITROSTAT 0.4 MG SL tablet DISSOLVE 1 TABLET UNDER THETONGUE AS NEEDED 25 tablet 1  . topiramate (TOPAMAX) 50 MG tablet Take 2 tablets (100 mg total) by mouth daily. 60 tablet 11  . traMADol (ULTRAM) 50 MG tablet Take 1 tablet (50 mg total) by mouth every 6 (six) hours as needed for severe pain. Take 1 or 2 tablets as needed three times daily 30 tablet 1   No current  facility-administered medications on file prior to visit.     Past Medical History:  Diagnosis Date  . AAA (abdominal aortic aneurysm) ([place])   . Arthritis   . Autoimmune hepatitis ([place])   . Cancer ([place])    skin  . Cervicogenic headache   . Coronary atherosclerosis of unspecified type of vessel, native or graft   . Depression   . Hx of cardiovascular stress test    LexiScan Myoview (12/14):  No ischemia, EF 59%, normal study.  . Hyperlipidemia   . Hypertension   . ILD (interstitial lung disease) ([place])   . Memory deficits 09/23/2013  . Occlusion and stenosis of carotid artery without mention of cerebral infarction   . Osteoarthrosis, unspecified whether generalized or localized, unspecified site   . Other and unspecified hyperlipidemia   . Peripheral vascular disease, unspecified ([place])   . Unspecified hemorrhoids without mention of complication     Past Surgical History:  Procedure Laterality Date  . ABDOMINAL AORTIC ANEURYSM REPAIR  Nov. 15, 2011   EVAR   . APPENDECTOMY    . CORONARY ARTERY BYPASS GRAFT  2001   x4  . LUMBAR DISC SURGERY    . POLYPECTOMY    . SKIN CANCER EXCISION    . SPINE SURGERY    .  TONSILLECTOMY    . Triple A Indo Vascular Graft  8/09    Family History  Problem Relation Age of Onset  . Colon cancer Neg Hx   . Lung disease Neg Hx   . Rheumatologic disease Neg Hx   . Colon polyps Father   . Heart disease Mother     After age 46  . Hyperlipidemia Mother   . Heart attack Mother   . Multiple sclerosis Sister   . Diabetes Sister   . Heart disease Paternal Grandmother     Social History   Social History  . Marital status: Married    Spouse name: [name]  . Number of children: 4  . Years of education: DOCTORATE   Occupational History  . Retired Retired    Theme park manager   Social History Main Topics  . Smoking status: Former Smoker    Packs/day: 1.00    Years: 40.00    Types: Cigarettes    Start date: 04/25/1960    Quit date: 10/18/1999  .  Smokeless tobacco: Never Used     Comment: quit 1970  . Alcohol use 0.0 oz/week     Comment: rare alcohol  . Drug use: No  . Sexual activity: Not Asked   Other Topics Concern  . None   Social History Narrative   Patient lives at home with wife [name].    Patient has 4 children.    Patient is retired.   Patient has a college education.        Pulmonary:   Originally from [place], [place]. Has also lived in [place], [place], [place], [place]. 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 No pets currently. Had a [place] with his children and a second [place] in [place] just prior to 1985. No mold, asbestos, or hot tub exposure. He has done wood working with treated lumber but always domestic wood. No metal working.       Objective:   Physical Exam BP 124/60 (BP Location: Left Arm, Cuff Size: Normal)   Pulse (!) 58   Ht 5' 10.5" (1.791 m)   Wt 169 lb (76.7 kg)   SpO2 95%   BMI 23.91 kg/m  General:  Awake. Alert. Accompanied by wife today.  Integument:  Warm & dry. No rash on exposed skin.  Lymphatics:  No appreciated cervical or supraclavicular lymphadenoapthy. HEENT:  Moist mucus membranes. No nasal turbinate swelling. No scleral icterus. Cardiovascular:  Regular rate. No edema. Normal S1 & S2. Pulmonary:  Basilar Velcro crackles unchanged. Otherwise normal work of breathing without accessory muscle use on room air. Speaking in complete sentences. Abdomen: Soft. Normal bowel sounds. Nontender.  PFT 05/16/16: FVC 3.49 L (83%) FEV1 2.44 L (80%) FEV1/FVC 0.70 FEF 25-75 1.41 L (66%) no bronchodilator response                                                                 DLCO uncorrected 33% 01/27/16: FVC 3.32 L (78%) FEV1 2.33 L (76%) FEV1/FVC 0.70 FEF 25-75 1.39 L (64%) (unable to  obtain DLCO  due to cough) 11/03/15: FVC 3.40 L (78%) FEV1 2.44 L (78%) FEV1/FVC 0.72 FEF 25-75 1.60 L (72%)                                              TLC 4.62 L (63%) RV 46% ERV 109% DLCO uncorrected 32%  6MWT 05/16/16:  Walked 378 meters / Baseline Sat 97% on RA / Nadir Sat 94% on RA 01/27/16: Walked 405 meters / Baseline sat 96% on RA / Nadir Sat 92% on RA  IMAGING HRCT CHEST W/O 12/21/15 (previously reviewed by me): Atypical UIP pattern with basilar predominance subpleural intralobular septal thickening and honeycombing changes. No evidence for groundglass opacities. No pleural effusion or thickening. No pericardial effusion. No mediastinal mass. 50m right upper lobe nodule unchanged.  CT CHEST W/O 11/06/15 (previously reviewed by me): Basilar predominant subpleural intralobular septal thickening. Suggestion of subpleural honeycomb changes as well. No gross bronchiectasis. Calcified nodule in left face consistent with prior granulomatous. No identification of calcifications within spleen or liver. No pathologic mediastinal adenopathy. Minimal pleural thickening on the right without pleural effusion. 3 mm right apical nodule. These changes appear to be present on abdominal CT imaging going back to 2009.  LABS 12/16/15 CRP: 0.5 ESR: 34 ANA: Negative Rheumatoid factor: 16 Anti-CCP:  <16 SSA:  <1 SSB:  <1 SCL 70:  <1 Hypersensitivity pneumonitis panel: Negative  11/16/15 Pro-BNP:  354.5  ABG on RA (08/30/10): 7.40 / 37 / 88 / 97%  02/11/09 Alpha-1 antitrypsin: 189 ANA: Negative     Assessment & Plan:  89-year-old Caucasian male underlying ILD. Likely represents underlying IPF with atypical UIP pattern on CT imaging and negative serologies. Patient's spirometry today has returned to his baseline. It's likely that his previous call was due to allergic rhinitis given symptomatic improvement with medication started by his primary care physician. There is been a slight decrease in his  walk test distance but his oxygen saturation has remained stable. Wife indicates that he is having some increase in his memory deficits at this time. We did discuss initiating treatment with Ofev/Esbriet and even undergoing surgical lung biopsy at this time to provide a definitive diagnosis, but in the setting of continued clinical stability and absence of worsening symptoms I do not feel either of these would be of great benefit to the patient long-term. I do not feel that any other immunosuppressive measures would be of great benefit to the patient either. I instructed the patient and his wife to contact my office if he had any new breathing problems or questions before next appointment.   1. ILD/Atypical UIP Pattern: Continuing to hold off on treatment of probable underlying IPF. Also holding off on surgical lung biopsy at this time. Continuing monitoring of pulmonary function with spirometry with DLCO and 6 minute walk test on room air at next appointment.  2. RUL Nodule: Plan for repeat CT imaging in January 2018. 3. Health Maintenance: Received Prevnar 2015 & Pneumovax 2007.  recommended influenza vaccine in the Fall. 4. Follow-up: Return to clinic in 3 months or sooner if needed.  JSonia BallerNAshok Cordia M.D. LH[facility] Care[facility]ulmonary [facility] Critical Care Pager:  [phone] 3pm or if no response, call [phone]:[phone] PM 05/16/16

## 2016-05-16 NOTE — Patient Instructions (Signed)
   Call me if you have any new breathing problems or questions before your next appointment.  I will see you back in 3 months or sooner if needed.  TESTS ORDERED: 1. Spirometry with DLCO at next appointment 2. 6MWT on room air at next appointment

## 2016-05-16 NOTE — Progress Notes (Signed)
Test reviewed.  

## 2016-05-19 ENCOUNTER — Ambulatory Visit (INDEPENDENT_AMBULATORY_CARE_PROVIDER_SITE_OTHER): Payer: Medicare Other | Admitting: Neurology

## 2016-05-19 ENCOUNTER — Encounter: Payer: Self-pay | Admitting: Neurology

## 2016-05-19 VITALS — BP 122/68 | HR 60 | Ht 70.0 in | Wt 167.0 lb

## 2016-05-19 DIAGNOSIS — R413 Other amnesia: Secondary | ICD-10-CM | POA: Diagnosis not present

## 2016-05-19 DIAGNOSIS — G441 Vascular headache, not elsewhere classified: Secondary | ICD-10-CM | POA: Diagnosis not present

## 2016-05-19 DIAGNOSIS — R42 Dizziness and giddiness: Secondary | ICD-10-CM

## 2016-05-19 DIAGNOSIS — R51 Headache: Secondary | ICD-10-CM

## 2016-05-19 DIAGNOSIS — I6523 Occlusion and stenosis of bilateral carotid arteries: Secondary | ICD-10-CM | POA: Diagnosis not present

## 2016-05-19 DIAGNOSIS — G4486 Cervicogenic headache: Secondary | ICD-10-CM

## 2016-05-19 MED ORDER — TRAMADOL HCL 50 MG PO TABS: 50.0000 mg | ORAL_TABLET | Freq: Four times a day (QID) | ORAL | 1 refills | Status: DC | PRN

## 2016-05-19 MED ORDER — GABAPENTIN 100 MG PO CAPS: 100.0000 mg | ORAL_CAPSULE | Freq: Three times a day (TID) | ORAL | 2 refills | Status: DC

## 2016-05-19 NOTE — Patient Instructions (Addendum)
With the Topamax 50 mg, take one daily for 2 weeks, then stop. We will begin gabapentin 100 mg three times a day.   Neurontin (gabapentin) may result in drowsiness, ankle swelling, gait instability, or possibly dizziness. Please contact our office if significant side effects occur with this medication.  Dizziness Dizziness is a common problem. It is a feeling of unsteadiness or light-headedness. You may feel like you are about to faint. Dizziness can lead to injury if you stumble or fall. Anyone can become dizzy, but dizziness is more common in older adults. This condition can be caused by a number of things, including medicines, dehydration, or illness. HOME CARE INSTRUCTIONS Taking these steps may help with your condition: Eating and Drinking  Drink enough fluid to keep your urine clear or pale yellow. This helps to keep you from becoming dehydrated. Try to drink more clear fluids, such as water.  Do not drink alcohol.  Limit your caffeine intake if directed by your health care provider.  Limit your salt intake if directed by your health care provider. Activity  Avoid making quick movements.  Rise slowly from chairs and steady yourself until you feel okay.  In the morning, first sit up on the side of the bed. When you feel okay, stand slowly while you hold onto something until you know that your balance is fine.  Move your legs often if you need to stand in one place for a long time. Tighten and relax your muscles in your legs while you are standing.  Do not drive or operate heavy machinery if you feel dizzy.  Avoid bending down if you feel dizzy. Place items in your home so that they are easy for you to reach without leaning over. Lifestyle  Do not use any tobacco products, including cigarettes, chewing tobacco, or electronic cigarettes. If you need help quitting, ask your health care provider.  Try to reduce your stress level, such as with yoga or meditation. Talk with your  health care provider if you need help. General Instructions  Watch your dizziness for any changes.  Take medicines only as directed by your health care provider. Talk with your health care provider if you think that your dizziness is caused by a medicine that you are taking.  Tell a friend or a family member that you are feeling dizzy. If he or she notices any changes in your behavior, have this person call your health care provider.  Keep all follow-up visits as directed by your health care provider. This is important. SEEK MEDICAL CARE IF:  Your dizziness does not go away.  Your dizziness or light-headedness gets worse.  You feel nauseous.  You have reduced hearing.  You have new symptoms.  You are unsteady on your feet or you feel like the room is spinning. SEEK IMMEDIATE MEDICAL CARE IF:  You vomit or have diarrhea and are unable to eat or drink anything.  You have problems talking, walking, swallowing, or using your arms, hands, or legs.  You feel generally weak.  You are not thinking clearly or you have trouble forming sentences. It may take a friend or family member to notice this.  You have chest pain, abdominal pain, shortness of breath, or sweating.  Your vision changes.  You notice any bleeding.  You have a headache.  You have neck pain or a stiff neck.  You have a fever.   This information is not intended to replace advice given to you by your health  care provider. Make sure you discuss any questions you have with your health care provider.   Document Released: 03/29/2001 Document Revised: 02/17/2015 Document Reviewed: 09/29/2014 Elsevier Interactive Patient Education [employer].

## 2016-05-19 NOTE — Progress Notes (Addendum)
Reason for visit: Memory disturbance  Cesar [name] is an 80 y.o. male  History of present illness:  Cesar [name] is a 80 year old right-handed white male with a history of a slowly progressive memory disturbance. He is on Aricept and Namenda, he is also in the CREAD study. The patient has not been able to tolerate higher levels of Aricept greater than 5 mg daily secondary to diarrhea. The patient reports ongoing issues with dizziness, mild gait instability, he uses a cane for ambulation. He will often times note dizziness when he first stands up, he has to remain still to become stable, then he can start walking. The patient has some headaches in the back of the head, this may spread to the top of the head with a pressure sensation. He reports that the sensation of dizziness is sometimes worse with the headache. He did have a fall on 01/11/2016 that necessitated an emergency room visit. He has had some mild changes in memory since last seen, he is not driving much, but he does well when he does drive. He has not given up any activities of daily living secondary to memory. He is on Topamax for the headache, but it is not clear that this has done anything for the headache.  Past Medical History:  Diagnosis Date  . AAA (abdominal aortic aneurysm) ([place])   . Arthritis   . Autoimmune hepatitis ([place])   . Cancer ([place])    skin  . Cervicogenic headache   . Coronary atherosclerosis of unspecified type of vessel, native or graft   . Depression   . Hx of cardiovascular stress test    LexiScan Myoview (12/14):  No ischemia, EF 59%, normal study.  . Hyperlipidemia   . Hypertension   . ILD (interstitial lung disease) ([place])   . Memory deficits 09/23/2013  . Occlusion and stenosis of carotid artery without mention of cerebral infarction   . Osteoarthrosis, unspecified whether generalized or localized, unspecified site   . Other and unspecified hyperlipidemia   . Peripheral vascular disease,  unspecified ([place])   . Unspecified hemorrhoids without mention of complication     Past Surgical History:  Procedure Laterality Date  . ABDOMINAL AORTIC ANEURYSM REPAIR  Nov. 15, 2011   EVAR   . APPENDECTOMY    . CORONARY ARTERY BYPASS GRAFT  2001   x4  . LUMBAR DISC SURGERY    . POLYPECTOMY    . SKIN CANCER EXCISION    . SPINE SURGERY    . TONSILLECTOMY    . Triple A Indo Vascular Graft  8/09    Family History  Problem Relation Age of Onset  . Colon polyps Father   . Heart disease Mother     After age 23  . Hyperlipidemia Mother   . Heart attack Mother   . Multiple sclerosis Sister   . Diabetes Sister   . Heart disease Paternal Grandmother   . Colon cancer Neg Hx   . Lung disease Neg Hx   . Rheumatologic disease Neg Hx     Social history:  reports that he quit smoking about 16 years ago. His smoking use included Cigarettes. He started smoking about 56 years ago. He has a 40.00 pack-year smoking history. He has never used smokeless tobacco. He reports that he drinks alcohol. He reports that he does not use drugs.   No Known Allergies  Medications:  Prior to Admission medications   Medication Sig Start Date End Date Taking? Authorizing Provider  aspirin 81 MG tablet Take 81 mg by mouth daily.     Yes Historical Provider, MD  benzonatate (TESSALON) 100 MG capsule Take 1-2 capsules (100-200 mg total) by mouth 3 (three) times daily as needed for cough. 01/21/16  Yes Javier Glazier, MD  donepezil (ARICEPT) 5 MG tablet TAKE 1 TABLET (5 MG TOTAL) BY MOUTH AT BEDTIME. 08/20/14  Yes Ward Givens, NP  fenofibrate 160 MG tablet Take 160 mg by mouth daily.     Yes Historical Provider, MD  fluticasone (FLONASE) 50 MCG/ACT nasal spray Place 2 sprays into both nostrils daily.   Yes Historical Provider, MD  loratadine (CLARITIN) 10 MG tablet Take 10 mg by mouth daily.   Yes Historical Provider, MD  meclizine (ANTIVERT) 12.5 MG tablet Take 1 tablet (12.5 mg total) by mouth 3 (three)  times daily as needed for dizziness. 01/07/16  Yes Samantha Tripp Dowless, PA-C  memantine (NAMENDA) 10 MG tablet Take 1 tablet (10 mg total) by mouth 2 (two) times daily. 08/20/14  Yes Ward Givens, NP  Multiple Vitamin (MULTIVITAMIN WITH MINERALS) TABS tablet Take 1 tablet by mouth daily.   Yes Historical Provider, MD  NITROSTAT 0.4 MG SL tablet DISSOLVE 1 TABLET UNDER THETONGUE AS NEEDED 03/13/13  Yes Minus Breeding, MD  sertraline (ZOLOFT) 100 MG tablet Once daily 03/29/16  Yes Historical Provider, MD  traMADol (ULTRAM) 50 MG tablet Take 1 tablet (50 mg total) by mouth every 6 (six) hours as needed for severe pain. Take 1 or 2 tablets as needed three times daily 05/19/16  Yes Kathrynn Ducking, MD  gabapentin (NEURONTIN) 100 MG capsule Take 1 capsule (100 mg total) by mouth 3 (three) times daily. 05/19/16   Kathrynn Ducking, MD    ROS:  Out of a complete 14 system review of symptoms, the patient complains only of the following symptoms, and all other reviewed systems are negative.  Decreased appetite, weight loss Dizziness, headache Memory problem  Blood pressure 122/68, pulse 60, height 5\' 10"  (1.778 m), weight 167 lb (75.8 kg).  Blood pressure, right arm, standing is 123XX123 systolic. Blood pressure, right arm, sitting is 123456 systolic.  Physical Exam  General: The patient is alert and cooperative at the time of the examination.  Skin: No significant peripheral edema is noted.   Neurologic Exam  Mental status: The patient is alert and oriented x 3 at the time of the examination. The patient has apparent normal recent and remote memory, with an apparently normal attention span and concentration ability. Mini-Mental Status Examination done today shows a total score 24/30.   Cranial nerves: Facial symmetry is present. Speech is normal, no aphasia or dysarthria is noted. Extraocular movements are full. Visual fields are full.  Motor: The patient has good strength in all 4  extremities.  Sensory examination: Soft touch sensation is symmetric on the face, arms, and legs.  Coordination: The patient has good finger-nose-finger and heel-to-shin bilaterally.  Gait and station: The patient has a normal gait. Tandem gait is normal. Romberg is negative. No drift is seen.  Reflexes: Deep tendon reflexes are symmetric.   Assessment/Plan:  1. Memory disturbance  2. Possible cervicogenic headache  3. Gait instability, dizziness  The patient is on Topamax for the headaches, this is not offering much benefit. This may also worsen his cognitive functioning. We will taper him off of the Topamax going to 50 mg at night for 2 weeks, then stop the drug. The patient will be placed on gabapentin in  low dose taking 100 mg 3 times daily for the neck discomfort and headache. The patient will remain on Aricept and Namenda. He will follow-up in 6 months. A prescription was written for the Ultram.  Jill Alexanders MD 05/19/2016 10:41 AM  [facility] [address] [place], [place] [address]  Phone [phone] Fax [phone]

## 2016-05-23 ENCOUNTER — Other Ambulatory Visit: Payer: Self-pay | Admitting: Neurology

## 2016-05-23 DIAGNOSIS — G441 Vascular headache, not elsewhere classified: Secondary | ICD-10-CM

## 2016-05-23 MED ORDER — TRAMADOL HCL 50 MG PO TABS: 50.0000 mg | ORAL_TABLET | Freq: Four times a day (QID) | ORAL | Status: AC | PRN

## 2016-05-23 NOTE — Progress Notes (Signed)
I called the pharmacist today. There was a set of instructions on Ultram that was confusing, the patient is to take 1 tablet every 6 hours if needed. Instructions were clarified with the pharmacist.

## 2016-05-24 ENCOUNTER — Other Ambulatory Visit: Payer: Self-pay | Admitting: Neurology

## 2016-05-24 DIAGNOSIS — G301 Alzheimer's disease with late onset: Principal | ICD-10-CM

## 2016-05-24 DIAGNOSIS — F0280 Dementia in other diseases classified elsewhere without behavioral disturbance: Secondary | ICD-10-CM

## 2016-06-08 ENCOUNTER — Encounter: Payer: Self-pay | Admitting: Gastroenterology

## 2016-06-10 ENCOUNTER — Ambulatory Visit
Admission: RE | Admit: 2016-06-10 | Discharge: 2016-06-10 | Disposition: A | Discharge status: Home / Self Care | Payer: No Typology Code available for payment source | Source: Ambulatory Visit | Attending: Neurology | Admitting: Neurology

## 2016-06-10 DIAGNOSIS — F0280 Dementia in other diseases classified elsewhere without behavioral disturbance: Secondary | ICD-10-CM

## 2016-06-10 DIAGNOSIS — G301 Alzheimer's disease with late onset: Principal | ICD-10-CM

## 2016-06-28 ENCOUNTER — Telehealth: Payer: Self-pay | Admitting: Neurology

## 2016-06-28 ENCOUNTER — Encounter: Payer: Self-pay | Admitting: Internal Medicine

## 2016-06-28 MED ORDER — GABAPENTIN 100 MG PO CAPS: 200.0000 mg | ORAL_CAPSULE | Freq: Three times a day (TID) | ORAL | 2 refills | Status: AC

## 2016-06-28 NOTE — Addendum Note (Signed)
Addended by: Margette Fast on: 06/28/2016 11:15 AM   Modules accepted: Orders

## 2016-06-28 NOTE — Telephone Encounter (Signed)
I called patient, talk with the wife. The patient is tolerating the gabapentin taking 100 mg 3 times daily, we will go up to a dose of 200 mg 3 times daily.

## 2016-06-28 NOTE — Telephone Encounter (Signed)
Patient's wife is calling. She states she thinks medication gabapentin (NEURONTIN) 100 MG capsule should be increased for the patient which was discussed before. Please call to [facility] on [address].

## 2016-06-28 NOTE — Telephone Encounter (Signed)
Pt currently has gabapentin 100 mg ordered TID.

## 2016-07-11 ENCOUNTER — Inpatient Hospital Stay (HOSPITAL_COMMUNITY)
Admission: EM | Admit: 2016-07-11 | Discharge: 2016-08-17 | DRG: 193 | Disposition: E | Payer: Medicare Other | Attending: Internal Medicine | Admitting: Internal Medicine

## 2016-07-11 ENCOUNTER — Encounter (HOSPITAL_COMMUNITY): Payer: Self-pay | Admitting: Emergency Medicine

## 2016-07-11 DIAGNOSIS — I2510 Atherosclerotic heart disease of native coronary artery without angina pectoris: Secondary | ICD-10-CM | POA: Diagnosis present

## 2016-07-11 DIAGNOSIS — I714 Abdominal aortic aneurysm, without rupture, unspecified: Secondary | ICD-10-CM | POA: Diagnosis present

## 2016-07-11 DIAGNOSIS — I739 Peripheral vascular disease, unspecified: Secondary | ICD-10-CM | POA: Diagnosis present

## 2016-07-11 DIAGNOSIS — I10 Essential (primary) hypertension: Secondary | ICD-10-CM | POA: Diagnosis present

## 2016-07-11 DIAGNOSIS — E785 Hyperlipidemia, unspecified: Secondary | ICD-10-CM | POA: Diagnosis present

## 2016-07-11 DIAGNOSIS — J189 Pneumonia, unspecified organism: Secondary | ICD-10-CM | POA: Diagnosis not present

## 2016-07-11 DIAGNOSIS — I959 Hypotension, unspecified: Secondary | ICD-10-CM | POA: Diagnosis not present

## 2016-07-11 DIAGNOSIS — Z951 Presence of aortocoronary bypass graft: Secondary | ICD-10-CM

## 2016-07-11 DIAGNOSIS — I248 Other forms of acute ischemic heart disease: Secondary | ICD-10-CM | POA: Diagnosis present

## 2016-07-11 DIAGNOSIS — K754 Autoimmune hepatitis: Secondary | ICD-10-CM | POA: Diagnosis not present

## 2016-07-11 DIAGNOSIS — I25810 Atherosclerosis of coronary artery bypass graft(s) without angina pectoris: Secondary | ICD-10-CM

## 2016-07-11 DIAGNOSIS — Z95828 Presence of other vascular implants and grafts: Secondary | ICD-10-CM

## 2016-07-11 DIAGNOSIS — E871 Hypo-osmolality and hyponatremia: Secondary | ICD-10-CM | POA: Diagnosis present

## 2016-07-11 DIAGNOSIS — R0602 Shortness of breath: Secondary | ICD-10-CM

## 2016-07-11 DIAGNOSIS — Z888 Allergy status to other drugs, medicaments and biological substances status: Secondary | ICD-10-CM

## 2016-07-11 DIAGNOSIS — D72829 Elevated white blood cell count, unspecified: Secondary | ICD-10-CM | POA: Diagnosis not present

## 2016-07-11 DIAGNOSIS — Z66 Do not resuscitate: Secondary | ICD-10-CM | POA: Diagnosis not present

## 2016-07-11 DIAGNOSIS — S27309A Unspecified injury of lung, unspecified, initial encounter: Secondary | ICD-10-CM

## 2016-07-11 DIAGNOSIS — T380X5A Adverse effect of glucocorticoids and synthetic analogues, initial encounter: Secondary | ICD-10-CM | POA: Diagnosis not present

## 2016-07-11 DIAGNOSIS — J84112 Idiopathic pulmonary fibrosis: Secondary | ICD-10-CM | POA: Diagnosis present

## 2016-07-11 DIAGNOSIS — Z7982 Long term (current) use of aspirin: Secondary | ICD-10-CM

## 2016-07-11 DIAGNOSIS — F329 Major depressive disorder, single episode, unspecified: Secondary | ICD-10-CM | POA: Diagnosis present

## 2016-07-11 DIAGNOSIS — Z515 Encounter for palliative care: Secondary | ICD-10-CM | POA: Diagnosis not present

## 2016-07-11 DIAGNOSIS — J9621 Acute and chronic respiratory failure with hypoxia: Secondary | ICD-10-CM | POA: Diagnosis present

## 2016-07-11 DIAGNOSIS — J849 Interstitial pulmonary disease, unspecified: Secondary | ICD-10-CM

## 2016-07-11 DIAGNOSIS — F0390 Unspecified dementia without behavioral disturbance: Secondary | ICD-10-CM | POA: Diagnosis present

## 2016-07-11 DIAGNOSIS — Z7951 Long term (current) use of inhaled steroids: Secondary | ICD-10-CM

## 2016-07-11 DIAGNOSIS — R05 Cough: Secondary | ICD-10-CM

## 2016-07-11 DIAGNOSIS — R059 Cough, unspecified: Secondary | ICD-10-CM

## 2016-07-11 DIAGNOSIS — Z87891 Personal history of nicotine dependence: Secondary | ICD-10-CM

## 2016-07-11 LAB — COMPREHENSIVE METABOLIC PANEL
ALBUMIN: 2.9 g/dL — AB (ref 3.5–5.0)
ALT: 23 U/L (ref 17–63)
AST: 60 U/L — AB (ref 15–41)
Alkaline Phosphatase: 55 U/L (ref 38–126)
Anion gap: 9 (ref 5–15)
BILIRUBIN TOTAL: 0.9 mg/dL (ref 0.3–1.2)
BUN: 33 mg/dL — AB (ref 6–20)
CO2: 20 mmol/L — ABNORMAL LOW (ref 22–32)
Calcium: 9.1 mg/dL (ref 8.9–10.3)
Chloride: 104 mmol/L (ref 101–111)
Creatinine, Ser: 1.23 mg/dL (ref 0.61–1.24)
GFR calc Af Amer: >60 mL/min (ref >60)
GFR, EST NON AFRICAN AMERICAN: 54 mL/min — AB (ref >60)
GLUCOSE: 117 mg/dL — AB (ref 65–99)
POTASSIUM: 4.8 mmol/L (ref 3.5–5.1)
Sodium: 133 mmol/L — ABNORMAL LOW (ref 135–145)
Total Protein: 7.7 g/dL (ref 6.5–8.1)

## 2016-07-11 LAB — CBC WITH DIFFERENTIAL/PLATELET
BASOS ABS: 0 10*3/uL (ref 0.0–0.1)
BASOS PCT: 0 %
Eosinophils Absolute: 0.1 10*3/uL (ref 0.0–0.7)
Eosinophils Relative: 0 %
HEMATOCRIT: 31.5 % — AB (ref 39.0–52.0)
HEMOGLOBIN: 10.6 g/dL — AB (ref 13.0–17.0)
LYMPHS PCT: 6 %
Lymphs Abs: 0.9 10*3/uL (ref 0.7–4.0)
MCH: 30.1 pg (ref 26.0–34.0)
MCHC: 33.7 g/dL (ref 30.0–36.0)
MCV: 89.5 fL (ref 78.0–100.0)
MONO ABS: 0.9 10*3/uL (ref 0.1–1.0)
Monocytes Relative: 6 %
NEUTROS ABS: 13.2 10*3/uL — AB (ref 1.7–7.7)
NEUTROS PCT: 88 %
Platelets: 459 10*3/uL — ABNORMAL HIGH (ref 150–400)
RBC: 3.52 MIL/uL — AB (ref 4.22–5.81)
RDW: 13.8 % (ref 11.5–15.5)
WBC: 15.1 10*3/uL — AB (ref 4.0–10.5)

## 2016-07-11 LAB — I-STAT TROPONIN, ED: Troponin i, poc: 0.03 ng/mL (ref 0.00–0.08)

## 2016-07-11 LAB — I-STAT CG4 LACTIC ACID, ED: Lactic Acid, Venous: 0.99 mmol/L (ref 0.5–1.9)

## 2016-07-11 MED ORDER — DEXTROSE 5 % IV SOLN
500.0000 mg | Freq: Once | INTRAVENOUS | Status: AC
  Administered 2016-07-11: 500 mg via INTRAVENOUS
  Filled 2016-07-11: qty 500

## 2016-07-11 MED ORDER — SERTRALINE HCL 100 MG PO TABS
100.0000 mg | ORAL_TABLET | Freq: Every day | ORAL | Status: DC
  Administered 2016-07-12 – 2016-07-16 (×5): 100 mg via ORAL
  Filled 2016-07-11 (×5): qty 1

## 2016-07-11 MED ORDER — DEXTROSE 5 % IV SOLN
1.0000 g | INTRAVENOUS | Status: DC
  Administered 2016-07-12 – 2016-07-13 (×2): 1 g via INTRAVENOUS
  Filled 2016-07-11 (×2): qty 10

## 2016-07-11 MED ORDER — FENOFIBRATE 160 MG PO TABS
160.0000 mg | ORAL_TABLET | Freq: Every day | ORAL | Status: DC
  Administered 2016-07-11 – 2016-07-16 (×6): 160 mg via ORAL
  Filled 2016-07-11 (×6): qty 1

## 2016-07-11 MED ORDER — GABAPENTIN 100 MG PO CAPS
200.0000 mg | ORAL_CAPSULE | Freq: Three times a day (TID) | ORAL | Status: DC
  Administered 2016-07-11 – 2016-07-16 (×15): 200 mg via ORAL
  Filled 2016-07-11 (×16): qty 2

## 2016-07-11 MED ORDER — MEMANTINE HCL 5 MG PO TABS
10.0000 mg | ORAL_TABLET | Freq: Two times a day (BID) | ORAL | Status: DC
  Administered 2016-07-11 – 2016-07-16 (×10): 10 mg via ORAL
  Filled 2016-07-11: qty 1
  Filled 2016-07-11 (×2): qty 2
  Filled 2016-07-11 (×2): qty 1
  Filled 2016-07-11 (×4): qty 2
  Filled 2016-07-11: qty 1
  Filled 2016-07-11: qty 2
  Filled 2016-07-11: qty 1

## 2016-07-11 MED ORDER — FLUTICASONE PROPIONATE 50 MCG/ACT NA SUSP
2.0000 | Freq: Every day | NASAL | Status: DC
  Administered 2016-07-12 – 2016-07-16 (×4): 2 via NASAL
  Filled 2016-07-11 (×2): qty 16

## 2016-07-11 MED ORDER — SODIUM CHLORIDE 0.9 % IV BOLUS (SEPSIS)
1000.0000 mL | Freq: Once | INTRAVENOUS | Status: AC
  Administered 2016-07-11: 1000 mL via INTRAVENOUS

## 2016-07-11 MED ORDER — SODIUM CHLORIDE 0.9 % IV SOLN
INTRAVENOUS | Status: DC
  Administered 2016-07-11 – 2016-07-13 (×6): via INTRAVENOUS

## 2016-07-11 MED ORDER — DONEPEZIL HCL 10 MG PO TABS
5.0000 mg | ORAL_TABLET | Freq: Every day | ORAL | Status: DC
  Administered 2016-07-11 – 2016-07-15 (×5): 5 mg via ORAL
  Filled 2016-07-11 (×6): qty 1

## 2016-07-11 MED ORDER — ENOXAPARIN SODIUM 40 MG/0.4ML ~~LOC~~ SOLN
40.0000 mg | SUBCUTANEOUS | Status: DC
  Administered 2016-07-11 – 2016-07-12 (×2): 40 mg via SUBCUTANEOUS
  Filled 2016-07-11 (×3): qty 0.4

## 2016-07-11 MED ORDER — ASPIRIN EC 81 MG PO TBEC
81.0000 mg | DELAYED_RELEASE_TABLET | Freq: Every day | ORAL | Status: DC
  Administered 2016-07-11 – 2016-07-16 (×6): 81 mg via ORAL
  Filled 2016-07-11 (×6): qty 1

## 2016-07-11 MED ORDER — BENZONATATE 100 MG PO CAPS
100.0000 mg | ORAL_CAPSULE | Freq: Three times a day (TID) | ORAL | Status: DC | PRN
  Administered 2016-07-12 – 2016-07-13 (×2): 200 mg via ORAL
  Filled 2016-07-11 (×2): qty 2

## 2016-07-11 MED ORDER — DEXTROSE 5 % IV SOLN
1.0000 g | Freq: Once | INTRAVENOUS | Status: AC
  Administered 2016-07-11: 1 g via INTRAVENOUS
  Filled 2016-07-11: qty 10

## 2016-07-11 MED ORDER — LORATADINE 10 MG PO TABS
10.0000 mg | ORAL_TABLET | Freq: Every day | ORAL | Status: DC
  Administered 2016-07-12 – 2016-07-16 (×5): 10 mg via ORAL
  Filled 2016-07-11 (×5): qty 1

## 2016-07-11 MED ORDER — AZITHROMYCIN 250 MG PO TABS
500.0000 mg | ORAL_TABLET | ORAL | Status: DC
  Administered 2016-07-12 – 2016-07-16 (×5): 500 mg via ORAL
  Filled 2016-07-11 (×5): qty 2

## 2016-07-11 MED ORDER — TRAMADOL HCL 50 MG PO TABS: 50.0000 mg | ORAL_TABLET | Freq: Four times a day (QID) | ORAL | Status: DC | PRN

## 2016-07-11 NOTE — ED Provider Notes (Signed)
[facility] DEPT Provider Note   CSN: [id number] Arrival date & time: 07/04/2016  1221     History   Chief Complaint Chief Complaint  Patient presents with  . Shortness of Breath    HPI Cesar [name] is a 80 y.o. male.  HPI Cesar [name] is a 80 y.o. male with multiple medical problems, presents to ED with complaint of cough, shortness of breath, generalized malaise, no appetite. Patient's symptoms began with cough and shortness of breath which started last week. Over the weekend, symptoms worsened, he became generally weak, has had several episodes of bright colors, hasn't eaten much. Today went to her primary care doctor where he was found to have hypoxia, oxygen saturation as low as 74% on room air and as high as mid 80s. Chest x-ray showed right middle lobe pneumonia. Patient was sent here for further evaluation. Patient was placed on oxygen prior to transfer, and he states he feels much better. CBC was performed in the office as well and showed white blood cell count of 16.  Past Medical History:  Diagnosis Date  . AAA (abdominal aortic aneurysm) ([place])   . Arthritis   . Autoimmune hepatitis ([place])   . Cancer ([place])    skin  . Cervicogenic headache   . Coronary atherosclerosis of unspecified type of vessel, native or graft   . Depression   . Hx of cardiovascular stress test    LexiScan Myoview (12/14):  No ischemia, EF 59%, normal study.  . Hyperlipidemia   . Hypertension   . ILD (interstitial lung disease) ([place])   . Memory deficits 09/23/2013  . Occlusion and stenosis of carotid artery without mention of cerebral infarction   . Osteoarthrosis, unspecified whether generalized or localized, unspecified site   . Other and unspecified hyperlipidemia   . Peripheral vascular disease, unspecified ([place])   . Unspecified hemorrhoids without mention of complication     Patient Active Problem List   Diagnosis Date Noted  . Dizziness and giddiness 05/19/2016  .  Cervicogenic headache 05/19/2016  . Dyspnea 05/16/2016  . Cough 01/21/2016  . ILD (interstitial lung disease) ([place]) 12/16/2015  . Restrictive lung disease 12/16/2015  . Depression 12/16/2015  . Arthritis 12/16/2015  . Lung nodule < 6cm on CT 11/06/2015  . AAA (abdominal aortic aneurysm) without rupture ([place]) 10/23/2013  . Memory deficits 09/23/2013  . Aftercare following surgery of the circulatory system, [place] 05/01/2013  . Numbness and tingling of right leg 10/03/2012  . DYSLIPIDEMIA 07/15/2009  . CAROTID STENOSIS 07/15/2009  . PVD 07/15/2009  . HYPOTENSION 07/15/2009  . DEGENERATIVE JOINT DISEASE 07/15/2009  . CHEST PAIN 07/15/2009  . AUTOIMMUNE HEPATITIS 02/23/2009  . CAD 02/11/2009  . HEMORRHOIDS 02/11/2009  . TRANSAMINASES, SERUM, ELEVATED 02/11/2009  . HYPERTENSION NEC 02/11/2009  . AAA 02/10/2009  . FATTY LIVER DISEASE 02/10/2009    Past Surgical History:  Procedure Laterality Date  . ABDOMINAL AORTIC ANEURYSM REPAIR  Nov. 15, 2011   EVAR   . APPENDECTOMY    . CORONARY ARTERY BYPASS GRAFT  2001   x4  . LUMBAR DISC SURGERY    . POLYPECTOMY    . SKIN CANCER EXCISION    . SPINE SURGERY    . TONSILLECTOMY    . Triple A Indo Vascular Graft  8/09       Home Medications    Prior to Admission medications   Medication Sig Start Date End Date Taking? Authorizing Provider  aspirin 81 MG tablet Take 81 mg by  mouth daily.     Yes Historical Provider, MD  benzonatate (TESSALON) 100 MG capsule Take 1-2 capsules (100-200 mg total) by mouth 3 (three) times daily as needed for cough. 01/21/16  Yes Javier Glazier, MD  donepezil (ARICEPT) 5 MG tablet TAKE 1 TABLET (5 MG TOTAL) BY MOUTH AT BEDTIME. 08/20/14  Yes Ward Givens, NP  fenofibrate 160 MG tablet Take 160 mg by mouth daily.     Yes Historical Provider, MD  fluticasone (FLONASE) 50 MCG/ACT nasal spray Place 2 sprays into both nostrils daily.   Yes Historical Provider, MD  gabapentin (NEURONTIN) 100 MG capsule Take 2  capsules (200 mg total) by mouth 3 (three) times daily. 06/28/16  Yes Kathrynn Ducking, MD  loratadine (CLARITIN) 10 MG tablet Take 10 mg by mouth daily.   Yes Historical Provider, MD  meclizine (ANTIVERT) 12.5 MG tablet Take 1 tablet (12.5 mg total) by mouth 3 (three) times daily as needed for dizziness. 01/07/16  Yes Samantha Tripp Dowless, PA-C  memantine (NAMENDA) 10 MG tablet Take 1 tablet (10 mg total) by mouth 2 (two) times daily. 08/20/14  Yes Ward Givens, NP  Multiple Vitamin (MULTIVITAMIN WITH MINERALS) TABS tablet Take 1 tablet by mouth daily.   Yes Historical Provider, MD  NITROSTAT 0.4 MG SL tablet DISSOLVE 1 TABLET UNDER THETONGUE AS NEEDED 03/13/13  Yes Minus Breeding, MD  sertraline (ZOLOFT) 100 MG tablet Once daily 03/29/16  Yes Historical Provider, MD  traMADol (ULTRAM) 50 MG tablet Take 1 tablet (50 mg total) by mouth every 6 (six) hours as needed for severe pain. 05/23/16  Yes Kathrynn Ducking, MD    Family History Family History  Problem Relation Age of Onset  . Colon polyps Father   . Heart disease Mother     After age 44  . Hyperlipidemia Mother   . Heart attack Mother   . Multiple sclerosis Sister   . Diabetes Sister   . Heart disease Paternal Grandmother   . Colon cancer Neg Hx   . Lung disease Neg Hx   . Rheumatologic disease Neg Hx     Social History Social History  Substance Use Topics  . Smoking status: Former Smoker    Packs/day: 1.00    Years: 40.00    Types: Cigarettes    Start date: 04/25/1960    Quit date: 10/18/1999  . Smokeless tobacco: Never Used     Comment: quit 1970  . Alcohol use 0.0 oz/week     Comment: rare alcohol     Allergies   Zocor [simvastatin]   Review of Systems Review of Systems  Constitutional: Positive for activity change, appetite change, chills and fatigue.  HENT: Negative.   Respiratory: Positive for cough and shortness of breath. Negative for chest tightness.   Cardiovascular: Positive for chest pain. Negative for  palpitations and leg swelling.  Gastrointestinal: Negative for abdominal distention, abdominal pain, diarrhea, nausea and vomiting.  Genitourinary: Negative for dysuria, frequency, hematuria and urgency.  Musculoskeletal: Positive for myalgias. Negative for arthralgias, neck pain and neck stiffness.  Skin: Negative for rash.  Allergic/Immunologic: Negative for immunocompromised state.  Neurological: Positive for weakness. Negative for dizziness, light-headedness, numbness and headaches.  All other systems reviewed and are negative.    Physical Exam Updated Vital Signs BP 123/61   Pulse 78   Temp 99 F (37.2 C)   Resp 15   SpO2 95%   Physical Exam  Constitutional: He appears well-developed and well-nourished. No distress.  HENT:  Head: Normocephalic and atraumatic.  Eyes: Conjunctivae are normal.  Neck: Neck supple.  Cardiovascular: Normal rate, regular rhythm and normal heart sounds.   Pulmonary/Chest: Effort normal. No respiratory distress. He has no wheezes. He has rales. He exhibits no tenderness.  Rales at bases bilaterally  Abdominal: Soft. Bowel sounds are normal. He exhibits no distension. There is no tenderness. There is no rebound.  Musculoskeletal: He exhibits no edema.  Neurological: He is alert.  Skin: Skin is warm and dry.  Nursing note and vitals reviewed.    ED Treatments / Results  Labs (all labs ordered are listed, but only abnormal results are displayed) Labs Reviewed  CBC WITH DIFFERENTIAL/PLATELET - Abnormal; Notable for the following:       Result Value   WBC 15.1 (*)    RBC 3.52 (*)    Hemoglobin 10.6 (*)    HCT 31.5 (*)    Platelets 459 (*)    Neutro Abs 13.2 (*)    All other components within normal limits  COMPREHENSIVE METABOLIC PANEL - Abnormal; Notable for the following:    Sodium 133 (*)    CO2 20 (*)    Glucose, Bld 117 (*)    BUN 33 (*)    Albumin 2.9 (*)    AST 60 (*)    GFR calc non Af Amer 54 (*)    All other components  within normal limits  CULTURE, BLOOD (ROUTINE X 2)  CULTURE, BLOOD (ROUTINE X 2)  I-STAT TROPOININ, ED  I-STAT CG4 LACTIC ACID, ED  I-STAT CG4 LACTIC ACID, ED    EKG  EKG Interpretation  Date/Time:  Monday July 11 2016 12:56:50 EDT Ventricular Rate:  69 PR Interval:    QRS Duration: 85 QT Interval:  387 QTC Calculation: 415 R Axis:   37 Text Interpretation:  Sinus rhythm Probable left atrial enlargement artifact otherwise no significant change Confirmed by KNAPP  MD-J, JON [id number]) on 07/16/2016 1:07:52 PM       Radiology No results found.  Procedures Procedures (including critical care time)  Medications Ordered in ED Medications  sodium chloride 0.9 % bolus 1,000 mL (not administered)    And  0.9 %  sodium chloride infusion (not administered)  cefTRIAXone (ROCEPHIN) 1 g in dextrose 5 % 50 mL IVPB (not administered)  azithromycin (ZITHROMAX) 500 mg in dextrose 5 % 250 mL IVPB (not administered)     Initial Impression / Assessment and Plan / ED Course  I have reviewed the triage vital signs and the nursing notes.  Pertinent labs & imaging results that were available during my care of the patient were reviewed by me and considered in my medical decision making (see chart for details).  Clinical Course  Comment By Time  Pt is feeling better after treatment.  Vital signs are stable.  Pt is ready for transport to the floor Dorie Rank, MD 09/25 1552   Patient from primary care doctor's office with hypoxia, right middle lobe pneumonia. I'm unable to see this x-ray on epic. Patient is on 4 L. Oxygen saturation is 94%.  Labs with no significant findings, other than WBC of 15.1. Discussed with hospitalist, will admit. Patient was given Rocephin and Zithromax for his lung infection. Patient with no history of COPD, asthma, no wheezing on exam, did not order any breathing treatments or steroids. Doubt PE, no lower extremity swelling or pain, no recent travel or surgeries. EKG  and troponin negative.  Final Clinical Impressions(s) / ED Diagnoses  Final diagnoses:  CAP (community acquired pneumonia)    New Prescriptions New Prescriptions   No medications on file        Jeannett Senior, PA-C 06/21/2016 [place]    Dorie Rank, MD 07/13/16 [phone]

## 2016-07-11 NOTE — Progress Notes (Signed)
EDCM spoke to patient at bedside.  Patient lives at home with his wife.  Patient noted to be wearing oxygen in the ED, patient does not wear oxygen at home.  Patient reports his pcp is Dr. Virgina Jock and his Pulmonologist is Dr. Tera Partridge.  Patient reports he is able to complete his ADL's on his own at home.  Patient reports he has a cane at home.  Patient without home health needs at this time.  No further EDCM needs at this time.

## 2016-07-11 NOTE — ED Triage Notes (Signed)
Per PTAR from [facility], went for SOB. Dr states 74% RA, placed on 4LNC went up to 94%. Right lobe pneumonia. Elevated WBC. Pt A&Ox4. Lives at home with wife.

## 2016-07-11 NOTE — ED Notes (Signed)
Pt revitalized prior to transfer. BP soft. Hospitalist paged prior to transfer. Primary nurses Sandrea Matte and [place] notified.

## 2016-07-11 NOTE — H&P (Signed)
History and Physical    CRUISE BELMONT [id number] DOB: 1936/09/01 DOA: 07/12/2016  PCP: Precious Reel, MD  Patient coming from: Home  Chief Complaint: Dyspnea  HPI: Cesar [name] is a 80 y.o. male with medical history significant of interstitial lung disease, abdominal aortic aneurysm status post repair, autoimmune hepatitis, carotid stenosis, CAD, hypertension. Patient reports dyspnea at rest started 2 days ago. He did not try anything to help with his symptoms. He had decreased appetite with his worsening dyspnea. Dyspnea worsened through the weekend and he sought medical attention today at his primary care physician's office. An x-ray was performed and he was found to have a right middle lobe pneumonia and was hypoxic. He was placed on 4 L of O2 and sent to the emergency department for management.   ED Course: Vitals: Afebrile. Stable vitals Labs: Slightly hyponatremic with a CO2 of 20 and BUN of 33. White blood cell count at 15.1 thousand Imaging: None obtained in the ED secondary to patient obtaining a recent chest x-ray Medications/Course: 4 L O2 via nasal cannula. Ceftriaxone and azithromycin given  Review of Systems: Review of Systems  Constitutional: Positive for chills and malaise/fatigue. Negative for fever.  Respiratory: Positive for cough, sputum production and shortness of breath. Negative for hemoptysis and wheezing.   Cardiovascular: Negative for chest pain and palpitations.  Gastrointestinal: Negative for abdominal pain, constipation, diarrhea, nausea and vomiting.  All other systems reviewed and are negative.   Past Medical History:  Diagnosis Date  . AAA (abdominal aortic aneurysm) ([place])   . Arthritis   . Autoimmune hepatitis ([place])   . Cancer ([place])    skin  . Cervicogenic headache   . Coronary atherosclerosis of unspecified type of vessel, native or graft   . Depression   . Hx of cardiovascular stress test    LexiScan Myoview (12/14):  No ischemia,  EF 59%, normal study.  . Hyperlipidemia   . Hypertension   . ILD (interstitial lung disease) ([place])   . Memory deficits 09/23/2013  . Occlusion and stenosis of carotid artery without mention of cerebral infarction   . Osteoarthrosis, unspecified whether generalized or localized, unspecified site   . Other and unspecified hyperlipidemia   . Peripheral vascular disease, unspecified ([place])   . Unspecified hemorrhoids without mention of complication     Past Surgical History:  Procedure Laterality Date  . ABDOMINAL AORTIC ANEURYSM REPAIR  Nov. 15, 2011   EVAR   . APPENDECTOMY    . CORONARY ARTERY BYPASS GRAFT  2001   x4  . LUMBAR DISC SURGERY    . POLYPECTOMY    . SKIN CANCER EXCISION    . SPINE SURGERY    . TONSILLECTOMY    . Triple A Indo Vascular Graft  8/09     reports that he quit smoking about 16 years ago. His smoking use included Cigarettes. He started smoking about 56 years ago. He has a 40.00 pack-year smoking history. He has never used smokeless tobacco. He reports that he drinks alcohol. He reports that he does not use drugs.  Allergies  Allergen Reactions  . Zocor [Simvastatin] Other (See Comments)    "bothers his liver"     Family History  Problem Relation Age of Onset  . Colon polyps Father   . Heart disease Mother     After age 75  . Hyperlipidemia Mother   . Heart attack Mother   . Multiple sclerosis Sister   . Diabetes Sister   . Heart  disease Paternal Grandmother   . Colon cancer Neg Hx   . Lung disease Neg Hx   . Rheumatologic disease Neg Hx     Prior to Admission medications   Medication Sig Start Date End Date Taking? Authorizing Provider  aspirin 81 MG tablet Take 81 mg by mouth daily.     Yes Historical Provider, MD  benzonatate (TESSALON) 100 MG capsule Take 1-2 capsules (100-200 mg total) by mouth 3 (three) times daily as needed for cough. 01/21/16  Yes Javier Glazier, MD  donepezil (ARICEPT) 5 MG tablet TAKE 1 TABLET (5 MG TOTAL) BY MOUTH AT  BEDTIME. 08/20/14  Yes Ward Givens, NP  fenofibrate 160 MG tablet Take 160 mg by mouth daily.     Yes Historical Provider, MD  fluticasone (FLONASE) 50 MCG/ACT nasal spray Place 2 sprays into both nostrils daily.   Yes Historical Provider, MD  gabapentin (NEURONTIN) 100 MG capsule Take 2 capsules (200 mg total) by mouth 3 (three) times daily. 06/28/16  Yes Kathrynn Ducking, MD  loratadine (CLARITIN) 10 MG tablet Take 10 mg by mouth daily.   Yes Historical Provider, MD  meclizine (ANTIVERT) 12.5 MG tablet Take 1 tablet (12.5 mg total) by mouth 3 (three) times daily as needed for dizziness. 01/07/16  Yes Samantha Tripp Dowless, PA-C  memantine (NAMENDA) 10 MG tablet Take 1 tablet (10 mg total) by mouth 2 (two) times daily. 08/20/14  Yes Ward Givens, NP  Multiple Vitamin (MULTIVITAMIN WITH MINERALS) TABS tablet Take 1 tablet by mouth daily.   Yes Historical Provider, MD  NITROSTAT 0.4 MG SL tablet DISSOLVE 1 TABLET UNDER THETONGUE AS NEEDED 03/13/13  Yes Minus Breeding, MD  sertraline (ZOLOFT) 100 MG tablet Once daily 03/29/16  Yes Historical Provider, MD  traMADol (ULTRAM) 50 MG tablet Take 1 tablet (50 mg total) by mouth every 6 (six) hours as needed for severe pain. 05/23/16  Yes Kathrynn Ducking, MD    Physical Exam: Vitals:   07/13/2016 1647 07/03/2016 1700 06/19/2016 1718 07/06/2016 1730  BP: 103/62 111/55 122/63 122/65  Pulse:  67 62 (!) 59  Resp:  21 21 21   Temp:      TempSrc:      SpO2:  98% 96% 97%     Constitutional: NAD, calm, comfortable Eyes: PERRL, lids and conjunctivae normal ENMT: Mucous membranes are moist. Posterior pharynx clear of any exudate or lesions. Poor dentition.  Neck: normal, supple, no masses, no thyromegaly Respiratory: Bilateral diffuse crackles with worsened crackles and diminished breath sounds at right middle lung. No wheezing. No accessory muscle use Cardiovascular: Regular rate and rhythm, no murmurs / rubs / gallops. No extremity edema. 2+ pedal pulses.    Abdomen: no tenderness, no masses palpated. Bowel sounds positive.  Musculoskeletal: no clubbing / cyanosis. No joint deformity upper and lower extremities. Good ROM, no contractures. Normal muscle tone.  Skin: no rashes, lesions, ulcers. No induration Neurologic: CN 2-12 grossly intact. Sensation intact, DTR normal. Strength 4/5 in all 4.  Psychiatric: Normal judgment and insight. Alert and oriented x 3. Normal mood.    Labs on Admission: I have personally reviewed following labs and imaging studies  CBC:  Recent Labs Lab 07/02/2016 1423  WBC 15.1*  NEUTROABS 13.2*  HGB 10.6*  HCT 31.5*  MCV 89.5  PLT [id number]   Basic Metabolic Panel:  Recent Labs Lab 07/01/2016 1423  NA 133*  K 4.8  CL 104  CO2 20*  GLUCOSE 117*  BUN 33*  CREATININE 1.23  CALCIUM 9.1   GFR: CrCl cannot be calculated (Unknown ideal weight.). Liver Function Tests:  Recent Labs Lab 07/07/2016 1423  AST 60*  ALT 23  ALKPHOS 55  BILITOT 0.9  PROT 7.7  ALBUMIN 2.9*   No results for input(s): LIPASE, AMYLASE in the last 168 hours. No results for input(s): AMMONIA in the last 168 hours. Coagulation Profile: No results for input(s): INR, PROTIME in the last 168 hours. Cardiac Enzymes: No results for input(s): CKTOTAL, CKMB, CKMBINDEX, TROPONINI in the last 168 hours. BNP (last 3 results)  Recent Labs  11/16/15 1020  PROBNP 354.5   HbA1C: No results for input(s): HGBA1C in the last 72 hours. CBG: No results for input(s): GLUCAP in the last 168 hours. Lipid Profile: No results for input(s): CHOL, HDL, LDLCALC, TRIG, CHOLHDL, LDLDIRECT in the last 72 hours. Thyroid Function Tests: No results for input(s): TSH, T4TOTAL, FREET4, T3FREE, THYROIDAB in the last 72 hours. Anemia Panel: No results for input(s): VITAMINB12, FOLATE, FERRITIN, TIBC, IRON, RETICCTPCT in the last 72 hours. Urine analysis:    Component Value Date/Time   COLORURINE YELLOW 08/30/2010 0956   APPEARANCEUR CLEAR 08/30/2010  0956   LABSPEC 1.022 08/30/2010 0956   PHURINE 6.0 08/30/2010 0956   GLUCOSEU NEGATIVE 08/30/2010 0956   HGBUR NEGATIVE 08/30/2010 0956   BILIRUBINUR NEGATIVE 08/30/2010 0956   KETONESUR NEGATIVE 08/30/2010 0956   PROTEINUR NEGATIVE 08/30/2010 0956   UROBILINOGEN 0.2 08/30/2010 0956   NITRITE NEGATIVE 08/30/2010 0956   LEUKOCYTESUR  08/30/2010 0956    NEGATIVE MICROSCOPIC NOT DONE ON URINES WITH NEGATIVE PROTEIN, BLOOD, LEUKOCYTES, NITRITE, OR GLUCOSE <1000 mg/dL.   Radiological Exams on Admission: No results found.  EKG: Independently reviewed. Sinus rhythm  Assessment/Plan Principal Problem:   CAP (community acquired pneumonia) Active Problems:   CAD (coronary artery disease)   HYPOTENSION   Autoimmune hepatitis ([place])   AAA (abdominal aortic aneurysm) without rupture ([facility])   ILD (interstitial lung disease) ([place])  Community acquired pneumonia Patient is higher risk with chronic lung disease -Continue ceftriaxone and azithromycin -Continue O2 via nasal cannula -Urine strep and Legionella -If no improvement, repeat chest x-ray  CAD Patient is status post CABG with 4 vessel disease -Continue aspirin -Continue fenofibrate  Hypotension Patient is normotensive -Monitor  Autoimmune hepatitis -Avoid hepatotoxic medications  Cognitive impairment -Continue Aricept and Namenda   DVT prophylaxis: Lovenox Code Status: Full code Family Communication: Wife at bedside Disposition Plan: Likely discharge home tomorrow Consults called:  None Admission status:  Observation, medical floor   Cordelia Poche, MD [facility] Pager [phone]  If 7PM-7AM, please contact night-coverage [email or web address] Password Cesar [name]  07/10/2016, 5:35 PM

## 2016-07-11 NOTE — ED Notes (Signed)
Hospitalists at bedside.  

## 2016-07-11 NOTE — ED Notes (Signed)
Unit notified that pt transport will be delayed pending on Hospitalist approval

## 2016-07-12 DIAGNOSIS — I248 Other forms of acute ischemic heart disease: Secondary | ICD-10-CM | POA: Diagnosis present

## 2016-07-12 DIAGNOSIS — I10 Essential (primary) hypertension: Secondary | ICD-10-CM | POA: Diagnosis present

## 2016-07-12 DIAGNOSIS — J9621 Acute and chronic respiratory failure with hypoxia: Secondary | ICD-10-CM | POA: Diagnosis present

## 2016-07-12 DIAGNOSIS — K754 Autoimmune hepatitis: Secondary | ICD-10-CM | POA: Diagnosis not present

## 2016-07-12 DIAGNOSIS — Z7951 Long term (current) use of inhaled steroids: Secondary | ICD-10-CM | POA: Diagnosis not present

## 2016-07-12 DIAGNOSIS — Z87891 Personal history of nicotine dependence: Secondary | ICD-10-CM | POA: Diagnosis not present

## 2016-07-12 DIAGNOSIS — J9601 Acute respiratory failure with hypoxia: Secondary | ICD-10-CM | POA: Diagnosis not present

## 2016-07-12 DIAGNOSIS — Z515 Encounter for palliative care: Secondary | ICD-10-CM | POA: Diagnosis not present

## 2016-07-12 DIAGNOSIS — J849 Interstitial pulmonary disease, unspecified: Secondary | ICD-10-CM | POA: Diagnosis not present

## 2016-07-12 DIAGNOSIS — I959 Hypotension, unspecified: Secondary | ICD-10-CM | POA: Diagnosis present

## 2016-07-12 DIAGNOSIS — I25810 Atherosclerosis of coronary artery bypass graft(s) without angina pectoris: Secondary | ICD-10-CM | POA: Diagnosis not present

## 2016-07-12 DIAGNOSIS — I739 Peripheral vascular disease, unspecified: Secondary | ICD-10-CM | POA: Diagnosis present

## 2016-07-12 DIAGNOSIS — Z7189 Other specified counseling: Secondary | ICD-10-CM | POA: Diagnosis not present

## 2016-07-12 DIAGNOSIS — F329 Major depressive disorder, single episode, unspecified: Secondary | ICD-10-CM | POA: Diagnosis present

## 2016-07-12 DIAGNOSIS — I2510 Atherosclerotic heart disease of native coronary artery without angina pectoris: Secondary | ICD-10-CM | POA: Diagnosis present

## 2016-07-12 DIAGNOSIS — Z66 Do not resuscitate: Secondary | ICD-10-CM | POA: Diagnosis not present

## 2016-07-12 DIAGNOSIS — R0600 Dyspnea, unspecified: Secondary | ICD-10-CM | POA: Diagnosis not present

## 2016-07-12 DIAGNOSIS — T380X5A Adverse effect of glucocorticoids and synthetic analogues, initial encounter: Secondary | ICD-10-CM | POA: Diagnosis not present

## 2016-07-12 DIAGNOSIS — E871 Hypo-osmolality and hyponatremia: Secondary | ICD-10-CM | POA: Diagnosis present

## 2016-07-12 DIAGNOSIS — Z951 Presence of aortocoronary bypass graft: Secondary | ICD-10-CM | POA: Diagnosis not present

## 2016-07-12 DIAGNOSIS — F0390 Unspecified dementia without behavioral disturbance: Secondary | ICD-10-CM | POA: Diagnosis present

## 2016-07-12 DIAGNOSIS — Z888 Allergy status to other drugs, medicaments and biological substances status: Secondary | ICD-10-CM | POA: Diagnosis not present

## 2016-07-12 DIAGNOSIS — R05 Cough: Secondary | ICD-10-CM | POA: Diagnosis not present

## 2016-07-12 DIAGNOSIS — R0602 Shortness of breath: Secondary | ICD-10-CM | POA: Diagnosis present

## 2016-07-12 DIAGNOSIS — Z7982 Long term (current) use of aspirin: Secondary | ICD-10-CM | POA: Diagnosis not present

## 2016-07-12 DIAGNOSIS — J189 Pneumonia, unspecified organism: Secondary | ICD-10-CM | POA: Diagnosis present

## 2016-07-12 DIAGNOSIS — E785 Hyperlipidemia, unspecified: Secondary | ICD-10-CM | POA: Diagnosis present

## 2016-07-12 DIAGNOSIS — I714 Abdominal aortic aneurysm, without rupture: Secondary | ICD-10-CM | POA: Diagnosis not present

## 2016-07-12 DIAGNOSIS — J80 Acute respiratory distress syndrome: Secondary | ICD-10-CM | POA: Diagnosis not present

## 2016-07-12 DIAGNOSIS — D72829 Elevated white blood cell count, unspecified: Secondary | ICD-10-CM | POA: Diagnosis not present

## 2016-07-12 DIAGNOSIS — J84112 Idiopathic pulmonary fibrosis: Secondary | ICD-10-CM | POA: Diagnosis present

## 2016-07-12 DIAGNOSIS — Z95828 Presence of other vascular implants and grafts: Secondary | ICD-10-CM | POA: Diagnosis not present

## 2016-07-12 LAB — CBC WITH DIFFERENTIAL/PLATELET
Basophils Absolute: 0 10*3/uL (ref 0.0–0.1)
Basophils Relative: 0 %
EOS ABS: 0.4 10*3/uL (ref 0.0–0.7)
EOS PCT: 4 %
HCT: 29 % — ABNORMAL LOW (ref 39.0–52.0)
Hemoglobin: 9.5 g/dL — ABNORMAL LOW (ref 13.0–17.0)
LYMPHS ABS: 1.2 10*3/uL (ref 0.7–4.0)
Lymphocytes Relative: 11 %
MCH: 29.1 pg (ref 26.0–34.0)
MCHC: 32.8 g/dL (ref 30.0–36.0)
MCV: 88.7 fL (ref 78.0–100.0)
Monocytes Absolute: 1 10*3/uL (ref 0.1–1.0)
Monocytes Relative: 9 %
Neutro Abs: 8.4 10*3/uL — ABNORMAL HIGH (ref 1.7–7.7)
Neutrophils Relative %: 76 %
PLATELETS: 381 10*3/uL (ref 150–400)
RBC: 3.27 MIL/uL — AB (ref 4.22–5.81)
RDW: 13.9 % (ref 11.5–15.5)
WBC: 11 10*3/uL — AB (ref 4.0–10.5)

## 2016-07-12 LAB — COMPREHENSIVE METABOLIC PANEL
ALT: 19 U/L (ref 17–63)
ANION GAP: 5 (ref 5–15)
AST: 39 U/L (ref 15–41)
Albumin: 2.3 g/dL — ABNORMAL LOW (ref 3.5–5.0)
Alkaline Phosphatase: 48 U/L (ref 38–126)
BUN: 24 mg/dL — ABNORMAL HIGH (ref 6–20)
CHLORIDE: 109 mmol/L (ref 101–111)
CO2: 22 mmol/L (ref 22–32)
Calcium: 8.5 mg/dL — ABNORMAL LOW (ref 8.9–10.3)
Creatinine, Ser: 0.98 mg/dL (ref 0.61–1.24)
GFR calc non Af Amer: >60 mL/min (ref >60)
Glucose, Bld: 102 mg/dL — ABNORMAL HIGH (ref 65–99)
POTASSIUM: 3.9 mmol/L (ref 3.5–5.1)
SODIUM: 136 mmol/L (ref 135–145)
Total Bilirubin: 0.5 mg/dL (ref 0.3–1.2)
Total Protein: 6.3 g/dL — ABNORMAL LOW (ref 6.5–8.1)

## 2016-07-12 LAB — C DIFFICILE QUICK SCREEN W PCR REFLEX
C Diff antigen: NEGATIVE
C Diff interpretation: NOT DETECTED
C Diff toxin: NEGATIVE

## 2016-07-12 LAB — STREP PNEUMONIAE URINARY ANTIGEN: Strep Pneumo Urinary Antigen: NEGATIVE

## 2016-07-12 LAB — HIV ANTIBODY (ROUTINE TESTING W REFLEX): HIV Screen 4th Generation wRfx: NONREACTIVE

## 2016-07-12 MED ORDER — DIPHENOXYLATE-ATROPINE 2.5-0.025 MG/5ML PO LIQD
5.0000 mL | Freq: Four times a day (QID) | ORAL | Status: DC | PRN
  Administered 2016-07-12 – 2016-07-13 (×2): 5 mL via ORAL
  Filled 2016-07-12 (×2): qty 5

## 2016-07-12 MED ORDER — SODIUM CHLORIDE 0.9 % IV BOLUS (SEPSIS)
500.0000 mL | Freq: Once | INTRAVENOUS | Status: AC
  Administered 2016-07-12: 500 mL via INTRAVENOUS

## 2016-07-12 MED ORDER — ACETAMINOPHEN 325 MG PO TABS
650.0000 mg | ORAL_TABLET | ORAL | Status: DC | PRN
  Administered 2016-07-12 – 2016-07-17 (×3): 650 mg via ORAL
  Filled 2016-07-12 (×4): qty 2

## 2016-07-12 NOTE — Progress Notes (Signed)
PROGRESS NOTE    Cesar [name]  [id number] DOB: [birth year] DOA: 06/25/2016  PCP: Precious Reel, MD   Brief Narrative:  Cesar [name] is a 80 y.o. male with medical history significant of interstitial lung disease, abdominal aortic aneurysm status post repair, autoimmune hepatitis, carotid stenosis, CAD, hypertension. He has complaints of cough and dyspnea. Outpatient xray showed RML pneumonia. He was noted to by hypoxic and required 4 L in the ER.   Subjective: Dry cough. Loose stools. No abdominal pain or vomiting. No chest pain.   Assessment & Plan:   Principal Problem:   CAP (community acquired pneumonia)- acute hypoxic respiratory failure -Zithromax, Rocephin- O2 requirement improved to 2 L  Active Problems:   CAD (coronary artery disease) - aspirin    Autoimmune hepatitis -   AAA (abdominal aortic aneurysm) without rupture -   ILD/ restrictive lung disease - follows with Dr Milinda Hirschfeld  DVT prophylaxis: Lovenox Code Status: Full code Family Communication:  Disposition Plan: home in 1-2 days Consultants:   none Procedures:    Antimicrobials:  Anti-infectives    Start     Dose/Rate Route Frequency Ordered Stop   07/12/16 1600  azithromycin (ZITHROMAX) tablet 500 mg     500 mg Oral Every 24 hours 06/17/2016 1756 08/04/2016 1559   07/12/16 1000  cefTRIAXone (ROCEPHIN) 1 g in dextrose 5 % 50 mL IVPB     1 g 100 mL/hr over 30 Minutes Intravenous Every 24 hours 06/28/2016 1756 04-Aug-2016 0959   06/17/2016 1345  cefTRIAXone (ROCEPHIN) 1 g in dextrose 5 % 50 mL IVPB     1 g 100 mL/hr over 30 Minutes Intravenous  Once 07/07/2016 1338 06/27/2016 1511   07/12/2016 1345  azithromycin (ZITHROMAX) 500 mg in dextrose 5 % 250 mL IVPB     500 mg 250 mL/hr over 60 Minutes Intravenous  Once 07/02/2016 1338 07/10/2016 1646       Objective: Vitals:   07/08/2016 1730 06/23/2016 1823 07/04/2016 2237 07/12/16 0600  BP: 122/65 135/60 138/62 127/69  Pulse: (!) 59 74 68 70  Resp: 21 19 19 18     Temp:  97.9 F (36.6 C) 98.5 F (36.9 C) 98.7 F (37.1 C)  TempSrc:  Oral Oral Oral  SpO2: 97% 96% 99% 97%    Intake/Output Summary (Last 24 hours) at 07/12/16 1608 Last data filed at 07/12/16 1500  Gross per 24 hour  Intake          3330.83 ml  Output                2 ml  Net          3328.83 ml   There were no vitals filed for this visit.  Examination: General exam: Appears comfortable  HEENT: PERRLA, oral mucosa moist, no sclera icterus or thrush Respiratory system: bilateral basilar crackles- Respiratory effort normal. Cardiovascular system: S1 & S2 heard, RRR.  No murmurs  Gastrointestinal system: Abdomen soft, non-tender, nondistended. Normal bowel sound. No organomegaly Central nervous system: Alert and oriented. No focal neurological deficits. Extremities: No cyanosis, clubbing or edema Skin: No rashes or ulcers Psychiatry:  Mood & affect appropriate.     Data Reviewed: I have personally reviewed following labs and imaging studies  CBC:  Recent Labs Lab 06/17/2016 1423 07/12/16 0436  WBC 15.1* 11.0*  NEUTROABS 13.2* 8.4*  HGB 10.6* 9.5*  HCT 31.5* 29.0*  MCV 89.5 88.7  PLT 459* [id number]   Basic Metabolic Panel:  Recent Labs Lab 06/18/2016  1423 07/12/16 0436  NA 133* 136  K 4.8 3.9  CL 104 109  CO2 20* 22  GLUCOSE 117* 102*  BUN 33* 24*  CREATININE 1.23 0.98  CALCIUM 9.1 8.5*   GFR: CrCl cannot be calculated (Unknown ideal weight.). Liver Function Tests:  Recent Labs Lab 06/29/2016 1423 07/12/16 0436  AST 60* 39  ALT 23 19  ALKPHOS 55 48  BILITOT 0.9 0.5  PROT 7.7 6.3*  ALBUMIN 2.9* 2.3*   No results for input(s): LIPASE, AMYLASE in the last 168 hours. No results for input(s): AMMONIA in the last 168 hours. Coagulation Profile: No results for input(s): INR, PROTIME in the last 168 hours. Cardiac Enzymes: No results for input(s): CKTOTAL, CKMB, CKMBINDEX, TROPONINI in the last 168 hours. BNP (last 3 results)  Recent Labs  11/16/15 1020   PROBNP 354.5   HbA1C: No results for input(s): HGBA1C in the last 72 hours. CBG: No results for input(s): GLUCAP in the last 168 hours. Lipid Profile: No results for input(s): CHOL, HDL, LDLCALC, TRIG, CHOLHDL, LDLDIRECT in the last 72 hours. Thyroid Function Tests: No results for input(s): TSH, T4TOTAL, FREET4, T3FREE, THYROIDAB in the last 72 hours. Anemia Panel: No results for input(s): VITAMINB12, FOLATE, FERRITIN, TIBC, IRON, RETICCTPCT in the last 72 hours. Urine analysis:    Component Value Date/Time   COLORURINE YELLOW 08/30/2010 0956   APPEARANCEUR CLEAR 08/30/2010 0956   LABSPEC 1.022 08/30/2010 0956   PHURINE 6.0 08/30/2010 0956   GLUCOSEU NEGATIVE 08/30/2010 0956   HGBUR NEGATIVE 08/30/2010 0956   BILIRUBINUR NEGATIVE 08/30/2010 0956   KETONESUR NEGATIVE 08/30/2010 0956   PROTEINUR NEGATIVE 08/30/2010 0956   UROBILINOGEN 0.2 08/30/2010 0956   NITRITE NEGATIVE 08/30/2010 0956   LEUKOCYTESUR  08/30/2010 0956    NEGATIVE MICROSCOPIC NOT DONE ON URINES WITH NEGATIVE PROTEIN, BLOOD, LEUKOCYTES, NITRITE, OR GLUCOSE <1000 mg/dL.   Sepsis Labs: @LABRCNTIP (procalcitonin:4,lacticidven:4) ) Recent Results (from the past 240 hour(s))  Culture, blood (routine x 2)     Status: None (Preliminary result)   Collection Time: 06/17/2016  2:32 PM  Result Value Ref Range Status   Specimen Description BLOOD LEFT HAND  Final   Special Requests BOTTLES DRAWN AEROBIC AND ANAEROBIC 5CC  Final   Culture   Final    NO GROWTH < 24 HOURS Performed at [facility]    Report Status PENDING  Incomplete  Culture, blood (routine x 2)     Status: None (Preliminary result)   Collection Time: 07/04/2016  2:32 PM  Result Value Ref Range Status   Specimen Description BLOOD RIGHT ARM  Final   Special Requests BOTTLES DRAWN AEROBIC AND ANAEROBIC 5ML  Final   Culture   Final    NO GROWTH < 24 HOURS Performed at [facility]    Report Status PENDING  Incomplete  C difficile quick  scan w PCR reflex     Status: None   Collection Time: 07/12/16  6:13 AM  Result Value Ref Range Status   C Diff antigen NEGATIVE NEGATIVE Final   C Diff toxin NEGATIVE NEGATIVE Final   C Diff interpretation No C. difficile detected.  Final         Radiology Studies: No results found.    Scheduled Meds: . aspirin EC  81 mg Oral Daily  . azithromycin  500 mg Oral Q24H  . cefTRIAXone (ROCEPHIN)  IV  1 g Intravenous Q24H  . donepezil  5 mg Oral QHS  . enoxaparin (LOVENOX) injection  40  mg Subcutaneous Q24H  . fenofibrate  160 mg Oral Daily  . fluticasone  2 spray Each Nare Daily  . gabapentin  200 mg Oral TID  . loratadine  10 mg Oral Daily  . memantine  10 mg Oral BID  . sertraline  100 mg Oral Daily   Continuous Infusions: . sodium chloride 125 mL/hr at 07/12/16 1355     LOS: 0 days    Time spent in minutes: 78    [place], MD [facility] Pager: [email or web address] Password [email or web address] 07/12/2016, 4:08 PM

## 2016-07-13 ENCOUNTER — Inpatient Hospital Stay (HOSPITAL_COMMUNITY): Payer: Medicare Other

## 2016-07-13 DIAGNOSIS — J84112 Idiopathic pulmonary fibrosis: Secondary | ICD-10-CM

## 2016-07-13 DIAGNOSIS — J9601 Acute respiratory failure with hypoxia: Secondary | ICD-10-CM

## 2016-07-13 DIAGNOSIS — J189 Pneumonia, unspecified organism: Principal | ICD-10-CM

## 2016-07-13 DIAGNOSIS — J849 Interstitial pulmonary disease, unspecified: Secondary | ICD-10-CM

## 2016-07-13 DIAGNOSIS — J9621 Acute and chronic respiratory failure with hypoxia: Secondary | ICD-10-CM

## 2016-07-13 LAB — BLOOD GAS, ARTERIAL
Acid-base deficit: 5.8 mmol/L — ABNORMAL HIGH (ref 0.0–2.0)
Bicarbonate: 17.6 mmol/L — ABNORMAL LOW (ref 20.0–28.0)
Drawn by: 422461
FIO2: 100
O2 Content: 15 L/min
O2 Saturation: 90.1 %
Patient temperature: 98.7
pCO2 arterial: 29 mmHg — ABNORMAL LOW (ref 32.0–48.0)
pH, Arterial: 7.4 (ref 7.350–7.450)
pO2, Arterial: 61.8 mmHg — ABNORMAL LOW (ref 83.0–108.0)

## 2016-07-13 LAB — PROCALCITONIN: Procalcitonin: 0.36 ng/mL

## 2016-07-13 MED ORDER — HEPARIN (PORCINE) IN NACL 100-0.45 UNIT/ML-% IJ SOLN
1300.0000 [IU]/h | INTRAMUSCULAR | Status: DC
  Administered 2016-07-14: 1300 [IU]/h via INTRAVENOUS
  Filled 2016-07-13: qty 250

## 2016-07-13 MED ORDER — METHYLPREDNISOLONE SODIUM SUCC 125 MG IJ SOLR
80.0000 mg | Freq: Four times a day (QID) | INTRAMUSCULAR | Status: DC
  Administered 2016-07-13 – 2016-07-14 (×2): 80 mg via INTRAVENOUS
  Filled 2016-07-13 (×2): qty 2

## 2016-07-13 MED ORDER — ALBUTEROL SULFATE (2.5 MG/3ML) 0.083% IN NEBU: 2.5000 mg | INHALATION_SOLUTION | RESPIRATORY_TRACT | Status: DC | PRN

## 2016-07-13 MED ORDER — IPRATROPIUM-ALBUTEROL 0.5-2.5 (3) MG/3ML IN SOLN: 3.0000 mL | Freq: Four times a day (QID) | RESPIRATORY_TRACT | Status: DC

## 2016-07-13 MED ORDER — DEXTROSE 5 % IV SOLN
2.0000 g | Freq: Three times a day (TID) | INTRAVENOUS | Status: DC
  Administered 2016-07-13 – 2016-07-16 (×10): 2 g via INTRAVENOUS
  Filled 2016-07-13 (×11): qty 2

## 2016-07-13 MED ORDER — HEPARIN BOLUS VIA INFUSION
2500.0000 [IU] | Freq: Once | INTRAVENOUS | Status: AC
Start: 2016-07-13 — End: 2016-07-14
  Administered 2016-07-14: 2500 [IU] via INTRAVENOUS
  Filled 2016-07-13: qty 2500

## 2016-07-13 MED ORDER — OSELTAMIVIR PHOSPHATE 75 MG PO CAPS
75.0000 mg | ORAL_CAPSULE | Freq: Two times a day (BID) | ORAL | Status: DC
  Administered 2016-07-14 – 2016-07-15 (×4): 75 mg via ORAL
  Filled 2016-07-13 (×4): qty 1

## 2016-07-13 MED ORDER — ENSURE ENLIVE PO LIQD
237.0000 mL | Freq: Two times a day (BID) | ORAL | Status: DC
  Administered 2016-07-13 – 2016-07-17 (×7): 237 mL via ORAL

## 2016-07-13 MED ORDER — FUROSEMIDE 10 MG/ML IJ SOLN
20.0000 mg | Freq: Once | INTRAMUSCULAR | Status: AC
  Administered 2016-07-13: 20 mg via INTRAVENOUS
  Filled 2016-07-13: qty 2

## 2016-07-13 MED ORDER — IPRATROPIUM-ALBUTEROL 0.5-2.5 (3) MG/3ML IN SOLN
3.0000 mL | Freq: Three times a day (TID) | RESPIRATORY_TRACT | Status: DC
  Administered 2016-07-13 – 2016-07-17 (×12): 3 mL via RESPIRATORY_TRACT
  Filled 2016-07-13 (×14): qty 3

## 2016-07-13 MED ORDER — GUAIFENESIN ER 600 MG PO TB12
1200.0000 mg | ORAL_TABLET | Freq: Two times a day (BID) | ORAL | Status: DC
  Administered 2016-07-13 – 2016-07-16 (×7): 1200 mg via ORAL
  Filled 2016-07-13 (×8): qty 2

## 2016-07-13 NOTE — Progress Notes (Signed)
PROGRESS NOTE    Cesar [name]  [id number] DOB: [birth year] DOA: 06/22/2016  PCP: Precious Reel, MD   Brief Narrative:  Cesar [name] is a 80 y.o. male with medical history significant of interstitial lung disease, abdominal aortic aneurysm status post repair, autoimmune hepatitis, carotid stenosis, CAD, hypertension. He has complaints of cough and dyspnea. Outpatient xray showed RML pneumonia. He was noted to by hypoxic and required 4 L in the ER.   Subjective: Dry cough. Loose stools. No abdominal pain or vomiting. No chest pain. Fever 102.4 yesterday evening  Assessment & Plan:   CAP (community acquired pneumonia) - Continue with IV Rocephin and azithromycin,  - Follow on Legionella antigen , strep pneumonia negative, follow sputum cultures and blood cultures  - We'll start on Mucinex, chest PT and flutter valve - Chest x-ray showing severe pneumonia in right lung, patient remained febrile, discussed with PCCM who will evaluate patient today.  acute hypoxic respiratory failure - due  to pneumonia, in the setting of baseline ILD , continue with oxygen and wean as tolerated   CAD (coronary artery disease) -  denies any chest pain, continue with aspirin aspirin  Autoimmune hepatitis - Avoid hepatotoxic medication  Cognitive impairment -Continue Aricept and Namenda  ILD/ restrictive lung disease - follows with Dr Milinda Hirschfeld    AAA (abdominal aortic aneurysm) without ruptur  DVT prophylaxis: Lovenox Code Status: Full code Family Communication: Son and wife at bedside  Disposition Plan: home in 1-2 days Consultants:   none Procedures:    Antimicrobials:  Anti-infectives    Start     Dose/Rate Route Frequency Ordered Stop   07/12/16 1600  azithromycin (ZITHROMAX) tablet 500 mg     500 mg Oral Every 24 hours 06/30/2016 1756 08/13/16 1559   07/12/16 1000  cefTRIAXone (ROCEPHIN) 1 g in dextrose 5 % 50 mL IVPB     1 g 100 mL/hr over 30 Minutes Intravenous Every 24  hours 07/14/2016 1756 13-Aug-2016 0959   06/18/2016 1345  cefTRIAXone (ROCEPHIN) 1 g in dextrose 5 % 50 mL IVPB     1 g 100 mL/hr over 30 Minutes Intravenous  Once 06/30/2016 1338 06/19/2016 1511   07/03/2016 1345  azithromycin (ZITHROMAX) 500 mg in dextrose 5 % 250 mL IVPB     500 mg 250 mL/hr over 60 Minutes Intravenous  Once 07/03/2016 1338 07/04/2016 1646       Objective: Vitals:   07/12/16 2300 07/13/16 0445 07/13/16 0615 07/13/16 0806  BP:   123/76   Pulse:   75   Resp:   16   Temp: 99.1 F (37.3 C)  98.9 F (37.2 C) 98.7 F (37.1 C)  TempSrc: Oral  Oral Oral  SpO2:   97%   Weight:  75.4 kg (166 lb 3.6 oz)      Intake/Output Summary (Last 24 hours) at 07/13/16 1413 Last data filed at 07/13/16 0600  Gross per 24 hour  Intake          3242.83 ml  Output                0 ml  Net          3242.83 ml   Filed Weights   07/13/16 0445  Weight: 75.4 kg (166 lb 3.6 oz)    Examination: General exam: Appears comfortable  HEENT: PERRLA, oral mucosa moist, no sclera icterus or thrush Respiratory system: bilateral basilar crackles- Respiratory effort normal. Cardiovascular system: S1 & S2 heard, RRR.  No  murmurs  Gastrointestinal system: Abdomen soft, non-tender, nondistended. Normal bowel sound. No organomegaly Central nervous system: Alert and oriented. No focal neurological deficits. Extremities: No cyanosis, clubbing or edema Skin: No rashes or ulcers Psychiatry:  Mood & affect appropriate.     Data Reviewed: I have personally reviewed following labs and imaging studies  CBC:  Recent Labs Lab 06/19/2016 1423 07/12/16 0436  WBC 15.1* 11.0*  NEUTROABS 13.2* 8.4*  HGB 10.6* 9.5*  HCT 31.5* 29.0*  MCV 89.5 88.7  PLT 459* [id number]   Basic Metabolic Panel:  Recent Labs Lab 07/05/2016 1423 07/12/16 0436  NA 133* 136  K 4.8 3.9  CL 104 109  CO2 20* 22  GLUCOSE 117* 102*  BUN 33* 24*  CREATININE 1.23 0.98  CALCIUM 9.1 8.5*   GFR: Estimated Creatinine Clearance: 64.1 mL/min  (by C-G formula based on SCr of 0.98 mg/dL). Liver Function Tests:  Recent Labs Lab 06/20/2016 1423 07/12/16 0436  AST 60* 39  ALT 23 19  ALKPHOS 55 48  BILITOT 0.9 0.5  PROT 7.7 6.3*  ALBUMIN 2.9* 2.3*   No results for input(s): LIPASE, AMYLASE in the last 168 hours. No results for input(s): AMMONIA in the last 168 hours. Coagulation Profile: No results for input(s): INR, PROTIME in the last 168 hours. Cardiac Enzymes: No results for input(s): CKTOTAL, CKMB, CKMBINDEX, TROPONINI in the last 168 hours. BNP (last 3 results)  Recent Labs  11/16/15 1020  PROBNP 354.5   HbA1C: No results for input(s): HGBA1C in the last 72 hours. CBG: No results for input(s): GLUCAP in the last 168 hours. Lipid Profile: No results for input(s): CHOL, HDL, LDLCALC, TRIG, CHOLHDL, LDLDIRECT in the last 72 hours. Thyroid Function Tests: No results for input(s): TSH, T4TOTAL, FREET4, T3FREE, THYROIDAB in the last 72 hours. Anemia Panel: No results for input(s): VITAMINB12, FOLATE, FERRITIN, TIBC, IRON, RETICCTPCT in the last 72 hours. Urine analysis:    Component Value Date/Time   COLORURINE YELLOW 08/30/2010 0956   APPEARANCEUR CLEAR 08/30/2010 0956   LABSPEC 1.022 08/30/2010 0956   PHURINE 6.0 08/30/2010 0956   GLUCOSEU NEGATIVE 08/30/2010 0956   HGBUR NEGATIVE 08/30/2010 0956   BILIRUBINUR NEGATIVE 08/30/2010 0956   KETONESUR NEGATIVE 08/30/2010 0956   PROTEINUR NEGATIVE 08/30/2010 0956   UROBILINOGEN 0.2 08/30/2010 0956   NITRITE NEGATIVE 08/30/2010 0956   LEUKOCYTESUR  08/30/2010 0956    NEGATIVE MICROSCOPIC NOT DONE ON URINES WITH NEGATIVE PROTEIN, BLOOD, LEUKOCYTES, NITRITE, OR GLUCOSE <1000 mg/dL.   Sepsis Labs: @LABRCNTIP (procalcitonin:4,lacticidven:4) ) Recent Results (from the past 240 hour(s))  Culture, blood (routine x 2)     Status: None (Preliminary result)   Collection Time: 06/20/2016  2:32 PM  Result Value Ref Range Status   Specimen Description BLOOD LEFT HAND   Final   Special Requests BOTTLES DRAWN AEROBIC AND ANAEROBIC 5CC  Final   Culture   Final    NO GROWTH < 24 HOURS Performed at [facility]    Report Status PENDING  Incomplete  Culture, blood (routine x 2)     Status: None (Preliminary result)   Collection Time: 07/09/2016  2:32 PM  Result Value Ref Range Status   Specimen Description BLOOD RIGHT ARM  Final   Special Requests BOTTLES DRAWN AEROBIC AND ANAEROBIC 5ML  Final   Culture   Final    NO GROWTH < 24 HOURS Performed at [facility]    Report Status PENDING  Incomplete  C difficile quick scan w PCR reflex  Status: None   Collection Time: 07/12/16  6:13 AM  Result Value Ref Range Status   C Diff antigen NEGATIVE NEGATIVE Final   C Diff toxin NEGATIVE NEGATIVE Final   C Diff interpretation No C. difficile detected.  Final         Radiology Studies: Dg Chest Port 1 View  Result Date: 07/13/2016 CLINICAL DATA:  Cough and weakness EXAM: PORTABLE CHEST 1 VIEW COMPARISON:  Chest radiograph January 22, 2015 and chest CT December 21, 2015 FINDINGS: There is underlying lung fibrosis, most severe in the bases. The interstitium is somewhat prominent in a generalized manner compared to prior studies. There is extensive airspace opacity throughout much of the right lung, a new finding compared to prior studies. Heart size and pulmonary vascularity are normal. Patient is status post coronary artery bypass grafting. There is aortic atherosclerosis. There is no adenopathy. There are no evident bone lesions. IMPRESSION: Extensive airspace opacity throughout much of the right lung, consistent with extensive pneumonia/pneumonitis superimposed on lung fibrosis. Fibrotic changes are primarily noted in the lung bases. There is interstitial prominence bilaterally which is also new compared to previous study. There may be a degree of underlying volume overload or possibly lymphangitic spread of tumor given this change. No adenopathy. Stable  cardiac silhouette. Aortic atherosclerosis noted. Electronically Signed   By: Lowella Grip III M.D.   On: 07/13/2016 10:02      Scheduled Meds: . aspirin EC  81 mg Oral Daily  . azithromycin  500 mg Oral Q24H  . cefTRIAXone (ROCEPHIN)  IV  1 g Intravenous Q24H  . donepezil  5 mg Oral QHS  . enoxaparin (LOVENOX) injection  40 mg Subcutaneous Q24H  . feeding supplement (ENSURE ENLIVE)  237 mL Oral BID BM  . fenofibrate  160 mg Oral Daily  . fluticasone  2 spray Each Nare Daily  . gabapentin  200 mg Oral TID  . guaiFENesin  1,200 mg Oral BID  . loratadine  10 mg Oral Daily  . memantine  10 mg Oral BID  . sertraline  100 mg Oral Daily   Continuous Infusions: . sodium chloride 125 mL/hr at 07/13/16 0559     LOS: 1 day     Christol Thetford, MD [facility] [email or web address] Password [email or web address] 07/13/2016, 2:13 PM

## 2016-07-13 NOTE — Progress Notes (Addendum)
STAFF NOTE: I, Dr Ann Lions have personally reviewed patient's available data, including medical history, events of note, physical examination and test results as part of my evaluation. I have discussed with resident/NP and other care providers such as pharmacist, RN and RRT.  In addition,  I personally evaluated patient and elicited key findings of   S: asked by emd to see patient at bedside. Based on chart review - IPF with isolated severe dlco reduction march 2017 but no desats in 6MWD April 2017 (CT chest classic UIP in my opinon with negative autoimmune panel). Admitted with fever and wrosening infitlrates and needing 4L o2 on 07/07/2016 and being Rx for CAP . Currently desats with trying to get out of bed needing face mask o2. No chest pain. RVP [ending  O: currentl afebrile On face mask o2 RR 30-40 with shallow respiration Diffuse crackles c/w ILD   No results for input(s): TROPONINI in the last 168 hours.  Recent Labs Lab 06/22/2016 1438 07/13/16 1606  LATICACIDVEN 0.99  --   PROCALCITON  --  0.36    Recent Labs Lab 07/02/2016 1423 07/12/16 0436  NA 133* 136  K 4.8 3.9  CL 104 109  CO2 20* 22  GLUCOSE 117* 102*  BUN 33* 24*  CREATININE 1.23 0.98  CALCIUM 9.1 8.5*    cxr - diffuse infiltrates - significantly worse since April 2016  March 2017 - classic UIP  Urine strep - neg  A:  #Baseline   - IPF in my opinion (male, age > 36, negative autoimmune and hx, classic CT)  #cuirrently  - acute on chronic hypoxemic resp failure - progressively worse needing 4L at admit 07/14/2016 and face mask 07/13/2016 - diffuse infiltrates on CT - high fever at dmit   - DDx - cap but can easily be viral/flu IPF flare. Hiigh risk concomitant PE, CHF, MI   P: o2 for pulse ox goal > 88% CT chest high res stat and then CT angio tonight ECHO  Start tamiflu, lasix x 1, IV steroids for IPF flare and IV heparin per pharmacy  Risks benefits and limitations explained to patient,w ife,  son, daughter at bedside and they verbalized agreement and decided to proceed  OVerall poor prognosis - esp if IPF flare   .  Rest per NP/medical resident whose note is outlined above and that I agree with  The patient is critically ill with multiple organ systems failure and requires high complexity decision making for assessment and support, frequent evaluation and titration of therapies, application of advanced monitoring technologies and extensive interpretation of multiple databases.   Critical Care Time devoted to patient care services described in this note is  30  Minutes. This time reflects time of care of this signee Dr Brand Males. This critical care time does not reflect procedure time, or teaching time or supervisory time of PA/NP/Med student/Med Resident etc but could involve care discussion time    Dr. Brand Males, M.D., [facility].C.P Pulmonary and Critical Care Medicine Staff Physician [place] Pager: [phone], If no answer or between  15:00h - 7:00h: call [phone]  07/13/2016 11:10 PM

## 2016-07-13 NOTE — Progress Notes (Signed)
Pharmacy Antibiotic Note  Cesar [name] is a 80 y.o. male admitted on 06/25/2016 with pneumonia.  Pharmacy has been consulted for ceftazidime dosing. Pt has severe CAP superimposed on underlying ILD.  Pt is being transitioned from ceftriaxone to ceftazidime due to lack of response to treatment.   Plan: Start ceftazidime 2 gr IV q8h  Weight: 166 lb 3.6 oz (75.4 kg)  Temp (24hrs), Avg:99.4 F (37.4 C), Min:98 F (36.7 C), Max:102.4 F (39.1 C)   Recent Labs Lab 07/07/2016 1423 07/10/2016 1438 07/12/16 0436  WBC 15.1*  --  11.0*  CREATININE 1.23  --  0.98  LATICACIDVEN  --  0.99  --     Estimated Creatinine Clearance: 64.1 mL/min (by C-G formula based on SCr of 0.98 mg/dL).    Allergies  Allergen Reactions  . Zocor [Simvastatin] Other (See Comments)    "bothers his liver"     Antimicrobials this admission: 9/25 azithromycin  >> (10/2) 9/25 ceftriaxone >> 9/27 9/27 ceftazidime >>  Dose adjustments this admission: ---  Microbiology results: 9/25 BCx: NGTD 9/26 C. diff: negative  9/26 Strep pneumo: negative 9/26 Legionella: negative 9/26: HIV antibody: negative   Thank you for allowing pharmacy to be a part of this patient's care.  Royetta Asal, PharmD, BCPS Pager [phone] 07/13/2016 4:13 PM

## 2016-07-13 NOTE — Consult Note (Signed)
Name: Cesar [name] MRN: [id number] DOB: [birth year]    ADMISSION DATE:  07/09/2016 CONSULTATION DATE:  9/27  REFERRING MD :  Elgergawy   CHIEF COMPLAINT:  Pneumonia not responding to typical therapy   BRIEF PATIENT DESCRIPTION:  This is a 80 year old male f/b Cesar [name] for presumptive  ILD/atypical UIP pattern as well RUL nodule, both which which have been stable. Admitted on 9/25 w/ working dx of CAP. PCCM asked to see on 9/27 when his CXR had worsened and was still requiring higher levels of FIO2.   SIGNIFICANT EVENTS    STUDIES:  RVP 9/27>>>  HISTORY OF PRESENT ILLNESS:   This is a 80 year old male followed by Dr Cesar [name] w/ working dx of ILD/atypical UIP pattern (autoimmune work-up negative), biopsy held off as of July 2017 d/t stability of disease. Presents to the ED on 9/25 from his PCP w/ cc: dyspnea. The onset was approx 2 days prior to presentation, it was associated w/ decreased appetite, + chills, non productive cough, no chest pain, he had no sick exposures, no difficulty swallowing.  He decided to seek healthcare evaluation because his shortness of breath continued to worsen. Per report his CXR at his PCP demonstrated a new RML PNA, on presentation to the ED his initial eval showed WBC ct 15.1, and he was found to be hypoxic requiring 4 liters n/c in order to bring sats > 90%. He was admitted w/ working dx of CAP and treated w/ azith and rocephin. He continued to have fever after admit. PCCM was asked to see on 9/17 as his hypoxia persists and his CXR had worsened.   PAST MEDICAL HISTORY :   has a past medical history of AAA (abdominal aortic aneurysm) (Cesar [name]); Arthritis; Autoimmune hepatitis (Cesar [name]); Cancer (Cesar [name]); Cervicogenic headache; Coronary atherosclerosis of unspecified type of vessel, native or graft; Depression; cardiovascular stress test; Hyperlipidemia; Hypertension; ILD (interstitial lung disease) (Cesar [name]); Memory deficits (09/23/2013); Occlusion and stenosis of carotid  artery without mention of cerebral infarction; Osteoarthrosis, unspecified whether generalized or localized, unspecified site; Other and unspecified hyperlipidemia; Peripheral vascular disease, unspecified (Cesar [name]); and Unspecified hemorrhoids without mention of complication.  has a past surgical history that includes Lumbar disc surgery; Triple A Indo Vascular Graft (8/09); Polypectomy; Appendectomy; Tonsillectomy; Skin cancer excision; Coronary artery bypass graft (2001); Spine surgery; and Abdominal aortic aneurysm repair (Nov. 15, 2011). Prior to Admission medications   Medication Sig Start Date End Date Taking? Authorizing Provider  aspirin 81 MG tablet Take 81 mg by mouth daily.     Yes Historical Provider, MD  benzonatate (TESSALON) 100 MG capsule Take 1-2 capsules (100-200 mg total) by mouth 3 (three) times daily as needed for cough. 01/21/16  Yes Javier Glazier, MD  donepezil (ARICEPT) 5 MG tablet TAKE 1 TABLET (5 MG TOTAL) BY MOUTH AT BEDTIME. 08/20/14  Yes Ward Givens, NP  fenofibrate 160 MG tablet Take 160 mg by mouth daily.     Yes Historical Provider, MD  fluticasone (FLONASE) 50 MCG/ACT nasal spray Place 2 sprays into both nostrils daily.   Yes Historical Provider, MD  gabapentin (NEURONTIN) 100 MG capsule Take 2 capsules (200 mg total) by mouth 3 (three) times daily. 06/28/16  Yes Kathrynn Ducking, MD  loratadine (CLARITIN) 10 MG tablet Take 10 mg by mouth daily.   Yes Historical Provider, MD  meclizine (ANTIVERT) 12.5 MG tablet Take 1 tablet (12.5 mg total) by mouth 3 (three) times daily as needed for dizziness. 01/07/16  Yes Aldona Bar  Cesar Dowless, PA-C  memantine (NAMENDA) 10 MG tablet Take 1 tablet (10 mg total) by mouth 2 (two) times daily. 08/20/14  Yes Ward Givens, NP  Multiple Vitamin (MULTIVITAMIN WITH MINERALS) TABS tablet Take 1 tablet by mouth daily.   Yes Historical Provider, MD  NITROSTAT 0.4 MG SL tablet DISSOLVE 1 TABLET UNDER THETONGUE AS NEEDED 03/13/13  Yes Minus Breeding, MD  sertraline (ZOLOFT) 100 MG tablet Once daily 03/29/16  Yes Historical Provider, MD  traMADol (ULTRAM) 50 MG tablet Take 1 tablet (50 mg total) by mouth every 6 (six) hours as needed for severe pain. 05/23/16  Yes Kathrynn Ducking, MD   Allergies  Allergen Reactions  . Zocor [Simvastatin] Other (See Comments)    "bothers his liver"     FAMILY HISTORY:  family history includes Colon polyps in his father; Diabetes in his sister; Heart attack in his mother; Heart disease in his mother and paternal grandmother; Hyperlipidemia in his mother; Multiple sclerosis in his sister. SOCIAL HISTORY:  reports that he quit smoking about 16 years ago. His smoking use included Cigarettes. He started smoking about 56 years ago. He has a 40.00 pack-year smoking history. He has never used smokeless tobacco. He reports that he drinks alcohol. He reports that he does not use drugs.  REVIEW OF SYSTEMS:   Constitutional: + fever, chills, weight loss, + malaise/fatigue and diaphoresis.  HENT: Negative for hearing loss, ear pain, nosebleeds, congestion, + sore throat, neck pain, tinnitus and ear discharge.   Eyes: Negative for blurred vision, double vision, photophobia, pain, discharge and redness.  Respiratory: Non-productive cough, no hemoptysis, no sputum production, shortness of breath, wheezing and stridor.   Cardiovascular: Negative for chest pain, palpitations, orthopnea, claudication, leg swelling and PND.  Gastrointestinal: Negative for heartburn, nausea, vomiting, abdominal pain, diarrhea, constipation, blood in stool and melena.  Genitourinary: Negative for dysuria, urgency, frequency, hematuria and flank pain.  Musculoskeletal: Negative for myalgias, back pain, joint pain and falls.  Skin: Negative for itching and rash.  Neurological: Negative for dizziness, tingling, tremors, sensory change, speech change, focal weakness, seizures, loss of consciousness, weakness and headaches.    Endo/Heme/Allergies: Negative for environmental allergies and polydipsia. Does not bruise/bleed easily.  SUBJECTIVE:  Feels about the same VITAL SIGNS: Temp:  [98.7 F (37.1 C)-102.4 F (39.1 C)] 98.7 F (37.1 C) (09/27 0806) Pulse Rate:  [75-98] 75 (09/27 0615) Resp:  [16] 16 (09/27 0615) BP: (123-140)/(76-81) 123/76 (09/27 0615) SpO2:  [81 %-97 %] 97 % (09/27 0615) Weight:  [166 lb 3.6 oz (75.4 kg)] 166 lb 3.6 oz (75.4 kg) (09/27 0445)  PHYSICAL EXAMINATION: General:  Well developed white male, in no acute distress on 4 liters Neuro:  Awake, oriented. No focal def. Has some memory deficits HEENT:  [place]. No JVD, MMM Cardiovascular:  Rrr, no MRG Lungs:  Dry velco sounding crackles posterior no accessory use  Abdomen:  Soft, not tender + bowel sounds Musculoskeletal:  Equal st and bulk  Skin:  Warm and dry    Recent Labs Lab 06/21/2016 1423 07/12/16 0436  NA 133* 136  K 4.8 3.9  CL 104 109  CO2 20* 22  BUN 33* 24*  CREATININE 1.23 0.98  GLUCOSE 117* 102*    Recent Labs Lab 06/24/2016 1423 07/12/16 0436  HGB 10.6* 9.5*  HCT 31.5* 29.0*  WBC 15.1* 11.0*  PLT 459* 381   Dg Chest Cesar 1 View  Result Date: 07/13/2016 CLINICAL DATA:  Cough and weakness EXAM: PORTABLE CHEST 1  VIEW COMPARISON:  Chest radiograph January 22, 2015 and chest CT December 21, 2015 FINDINGS: There is underlying lung fibrosis, most severe in the bases. The interstitium is somewhat prominent in a generalized manner compared to prior studies. There is extensive airspace opacity throughout much of the right lung, a new finding compared to prior studies. Heart size and pulmonary vascularity are normal. Patient is status post coronary artery bypass grafting. There is aortic atherosclerosis. There is no adenopathy. There are no evident bone lesions. IMPRESSION: Extensive airspace opacity throughout much of the right lung, consistent with extensive pneumonia/pneumonitis superimposed on lung fibrosis. Fibrotic  changes are primarily noted in the lung bases. There is interstitial prominence bilaterally which is also new compared to previous study. There may be a degree of underlying volume overload or possibly lymphangitic spread of tumor given this change. No adenopathy. Stable cardiac silhouette. Aortic atherosclerosis noted. Electronically Signed   By: Lowella Grip III M.D.   On: 07/13/2016 10:02    ASSESSMENT / PLAN:  Acute Hypoxic respiratory failure  CAP vs viral PNA ILD/atypical UIP  Discussion  Acute hypoxic respiratory failure in setting of severe CAP superimposed on underlying ILD. D-dx include: CAP w/ resistant organism, viral.  or IPF flare. Pulmonary edema or worsening cancer w/ lymphangitic spread (both seems a little less likely). He does not appear toxic and I would expect if this were bacterial he would look much worse given how extensive his CXR changes are.   Plan/rec Change rocephin to ceftaz Cycle PCTs Ck respiratory virus panel Will defer to Dr Elsworth Soho re: CT scan.  [place] ACNP-BC [place] Pager # [phone] OR # [phone] if no answer  07/13/2016, 2:51 PM

## 2016-07-13 NOTE — Progress Notes (Signed)
ANTICOAGULATION CONSULT NOTE - Initial Consult  Pharmacy Consult for Heparin Indication: pulmonary embolus  Allergies  Allergen Reactions  . Zocor [Simvastatin] Other (See Comments)    "bothers his liver"     Patient Measurements: Height: 5\' 10"  (177.8 cm) Weight: 175 lb 4.3 oz (79.5 kg) IBW/kg (Calculated) : 73 Heparin Dosing Weight:   Vital Signs: Temp: 98.6 F (37 C) (09/27 2150) Temp Source: Oral (09/27 2150) BP: 126/55 (09/27 2131) Pulse Rate: 96 (09/27 2239)  Labs:  Recent Labs  07/08/2016 1423 07/12/16 0436  HGB 10.6* 9.5*  HCT 31.5* 29.0*  PLT 459* 381  CREATININE 1.23 0.98    Estimated Creatinine Clearance: 64.1 mL/min (by C-G formula based on SCr of 0.98 mg/dL).   Medical History: Past Medical History:  Diagnosis Date  . AAA (abdominal aortic aneurysm) ([place])   . Arthritis   . Autoimmune hepatitis ([place])   . Cancer ([place])    skin  . Cervicogenic headache   . Coronary atherosclerosis of unspecified type of vessel, native or graft   . Depression   . Hx of cardiovascular stress test    LexiScan Myoview (12/14):  No ischemia, EF 59%, normal study.  . Hyperlipidemia   . Hypertension   . ILD (interstitial lung disease) ([place])   . Memory deficits 09/23/2013  . Occlusion and stenosis of carotid artery without mention of cerebral infarction   . Osteoarthrosis, unspecified whether generalized or localized, unspecified site   . Other and unspecified hyperlipidemia   . Peripheral vascular disease, unspecified ([place])   . Unspecified hemorrhoids without mention of complication     Medications:  Infusions:  . heparin      Assessment: Patient with possible PE.  Consult notes "OK to discontinue if CT angiogram negative for pulmoanry embolism"  CT ordered.  No oral anticoagulants noted on med rec. Last dose enoxaparin 40mg  sq q24hr charted 9/26 PM.   Goal of Therapy:  Heparin level 0.3-0.7 units/ml Monitor platelets by anticoagulation protocol: Yes   Plan:   Heparin bolus 2500  units iv x1 Heparin drip at 1300 units/hr Daily CBC Next heparin level at 0900    Tyler Deis, Shea Stakes [place] 07/13/2016,11:29 PM

## 2016-07-14 ENCOUNTER — Encounter (HOSPITAL_COMMUNITY): Payer: Self-pay

## 2016-07-14 ENCOUNTER — Inpatient Hospital Stay (HOSPITAL_COMMUNITY): Payer: Medicare Other

## 2016-07-14 DIAGNOSIS — J80 Acute respiratory distress syndrome: Secondary | ICD-10-CM

## 2016-07-14 DIAGNOSIS — J9601 Acute respiratory failure with hypoxia: Secondary | ICD-10-CM

## 2016-07-14 LAB — BASIC METABOLIC PANEL
Anion gap: 8 (ref 5–15)
BUN: 14 mg/dL (ref 6–20)
CO2: 21 mmol/L — ABNORMAL LOW (ref 22–32)
Calcium: 8.7 mg/dL — ABNORMAL LOW (ref 8.9–10.3)
Chloride: 107 mmol/L (ref 101–111)
Creatinine, Ser: 1.08 mg/dL (ref 0.61–1.24)
GFR calc Af Amer: >60 mL/min (ref >60)
GFR calc non Af Amer: >60 mL/min (ref >60)
Glucose, Bld: 137 mg/dL — ABNORMAL HIGH (ref 65–99)
Potassium: 3.6 mmol/L (ref 3.5–5.1)
Sodium: 136 mmol/L (ref 135–145)

## 2016-07-14 LAB — RESPIRATORY PANEL BY PCR

## 2016-07-14 LAB — APTT: aPTT: 37 seconds — ABNORMAL HIGH (ref 24–36)

## 2016-07-14 LAB — ECHOCARDIOGRAM COMPLETE
Height: 70 in
Weight: 2804.25 oz

## 2016-07-14 LAB — CBC
HEMATOCRIT: 28.6 % — AB (ref 39.0–52.0)
HEMOGLOBIN: 9.7 g/dL — AB (ref 13.0–17.0)
MCH: 29.7 pg (ref 26.0–34.0)
MCHC: 33.9 g/dL (ref 30.0–36.0)
MCV: 87.5 fL (ref 78.0–100.0)
Platelets: 445 10*3/uL — ABNORMAL HIGH (ref 150–400)
RBC: 3.27 MIL/uL — AB (ref 4.22–5.81)
RDW: 13.9 % (ref 11.5–15.5)
WBC: 22.7 10*3/uL — ABNORMAL HIGH (ref 4.0–10.5)

## 2016-07-14 LAB — TROPONIN I
TROPONIN I: 0.35 ng/mL — AB (ref <0.03)
TROPONIN I: 0.37 ng/mL — AB (ref <0.03)
Troponin I: 0.28 ng/mL (ref <0.03)

## 2016-07-14 LAB — PROTIME-INR
INR: 1.37
Prothrombin Time: 17 seconds — ABNORMAL HIGH (ref 11.4–15.2)

## 2016-07-14 LAB — BRAIN NATRIURETIC PEPTIDE: B NATRIURETIC PEPTIDE 5: 1035.4 pg/mL — AB (ref 0.0–100.0)

## 2016-07-14 LAB — LEGIONELLA PNEUMOPHILA SEROGP 1 UR AG: L. PNEUMOPHILA SEROGP 1 UR AG: NEGATIVE

## 2016-07-14 LAB — PHOSPHORUS: PHOSPHORUS: 2.8 mg/dL (ref 2.5–4.6)

## 2016-07-14 LAB — MRSA PCR SCREENING: MRSA BY PCR: NEGATIVE

## 2016-07-14 LAB — HEPARIN LEVEL (UNFRACTIONATED): Heparin Unfractionated: 0.24 IU/mL — ABNORMAL LOW (ref 0.30–0.70)

## 2016-07-14 LAB — MAGNESIUM: MAGNESIUM: 2.1 mg/dL (ref 1.7–2.4)

## 2016-07-14 LAB — LACTIC ACID, PLASMA: Lactic Acid, Venous: 1.5 mmol/L (ref 0.5–1.9)

## 2016-07-14 LAB — SEDIMENTATION RATE: SED RATE: 126 mm/h — AB (ref 0–16)

## 2016-07-14 MED ORDER — HEPARIN SODIUM (PORCINE) 5000 UNIT/ML IJ SOLN
5000.0000 [IU] | Freq: Three times a day (TID) | INTRAMUSCULAR | Status: DC
  Administered 2016-07-14 – 2016-07-16 (×7): 5000 [IU] via SUBCUTANEOUS
  Filled 2016-07-14 (×7): qty 1

## 2016-07-14 MED ORDER — IOPAMIDOL (ISOVUE-370) INJECTION 76%
100.0000 mL | Freq: Once | INTRAVENOUS | Status: AC | PRN
  Administered 2016-07-14: 100 mL via INTRAVENOUS

## 2016-07-14 MED ORDER — FUROSEMIDE 10 MG/ML IJ SOLN
40.0000 mg | Freq: Once | INTRAMUSCULAR | Status: AC
  Administered 2016-07-14: 40 mg via INTRAVENOUS
  Filled 2016-07-14: qty 4

## 2016-07-14 MED ORDER — ORAL CARE MOUTH RINSE
15.0000 mL | Freq: Two times a day (BID) | OROMUCOSAL | Status: DC
  Administered 2016-07-14 – 2016-07-17 (×7): 15 mL via OROMUCOSAL

## 2016-07-14 MED ORDER — METHYLPREDNISOLONE SODIUM SUCC 125 MG IJ SOLR
125.0000 mg | Freq: Four times a day (QID) | INTRAMUSCULAR | Status: DC
  Administered 2016-07-14 – 2016-07-18 (×16): 125 mg via INTRAVENOUS
  Filled 2016-07-14 (×16): qty 2

## 2016-07-14 MED ORDER — POTASSIUM CHLORIDE 20 MEQ/15ML (10%) PO SOLN
40.0000 meq | Freq: Once | ORAL | Status: AC
  Administered 2016-07-14: 40 meq via ORAL
  Filled 2016-07-14: qty 30

## 2016-07-14 NOTE — Progress Notes (Signed)
Pt sats drop with any talking or movement. Called Resp he changed to [place] and NRB

## 2016-07-14 NOTE — Progress Notes (Signed)
Name: Cesar [name] MRN: [id number] DOB: [birth year]    ADMISSION DATE:  07/12/2016 CONSULTATION DATE:  9/27  REFERRING MD :  Elgergawy   CHIEF COMPLAINT:  Pneumonia not responding to typical therapy   BRIEF PATIENT DESCRIPTION:  This is a 80 year old male f/b [name] for presumptive  ILD/atypical UIP pattern as well RUL nodule, both which which have been stable. Admitted on 9/25 w/ working dx of CAP. PCCM asked to see on 9/27 when his CXR had worsened and was still requiring higher levels of FIO2.   SIGNIFICANT EVENTS  9/28 moved to ICU CT worse 9/28 CT chest: 1. No evidence of pulmonary embolus.2. Extensive bilateral honeycombing, with diffuse fibrotic change and interstitial prominence, significantly worsened from the prior study, concerning for worsening chronic interstitial lung disease and fibrosis.3. Large areas of crazy paving opacification in both lungs. This raises concern for superimposed ARDS or pneumonia, corresponding to the patient's acute symptoms. 4. Diffuse coronary artery calcifications seen. Mild biatrial enlargement noted. 5. Mild luminal narrowing suggested at the proximal left subclavian artery. 6. Right hilar nodes measure up to 1.7 cm in short axis, and a 1.4 cm azygoesophageal recess node is seen. This may reflect underlying infection. STUDIES:  RVP 9/27>>>  SUBJECTIVE:  Feels about the same VITAL SIGNS: Temp:  [97.8 F (36.6 C)-99.4 F (37.4 C)] 97.8 F (36.6 C) (09/28 0800) Pulse Rate:  [81-129] 81 (09/28 0600) Resp:  [17-35] 25 (09/28 0600) BP: (103-162)/(55-85) 147/67 (09/28 0600) SpO2:  [58 %-97 %] 97 % (09/28 0600) FiO2 (%):  [100 %] 100 % (09/27 2317) Weight:  [175 lb 4.3 oz (79.5 kg)] 175 lb 4.3 oz (79.5 kg) (09/27 2150) 100% NRB PHYSICAL EXAMINATION: General:  Well developed white male, in no acute distress on 100% NRB Neuro:  Awake, oriented. No focal def. Has some memory deficits HEENT:  [place]. No JVD, MMM Cardiovascular:  Rrr, no  MRG Lungs:  Dry velco sounding crackles posterior no accessory use at rest  Abdomen:  Soft, not tender + bowel sounds Musculoskeletal:  Equal st and bulk  Skin:  Warm and dry    Recent Labs Lab 07/03/2016 1423 07/12/16 0436 07/14/16 0349  NA 133* 136 136  K 4.8 3.9 3.6  CL 104 109 107  CO2 20* 22 21*  BUN 33* 24* 14  CREATININE 1.23 0.98 1.08  GLUCOSE 117* 102* 137*    Recent Labs Lab 06/30/2016 1423 07/12/16 0436 07/14/16 0349  HGB 10.6* 9.5* 9.7*  HCT 31.5* 29.0* 28.6*  WBC 15.1* 11.0* 22.7*  PLT 459* 381 445*   Ct Angio Chest Pe W Or Wo Contrast  Result Date: 07/14/2016 CLINICAL DATA:  Acute onset of fever and hypoxia. Initial encounter. EXAM: CT ANGIOGRAPHY CHEST WITH CONTRAST TECHNIQUE: Multidetector CT imaging of the chest was performed using the standard protocol during bolus administration of intravenous contrast. Multiplanar CT image reconstructions and MIPs were obtained to evaluate the vascular anatomy. CONTRAST:  100 mL of Isovue 370 IV contrast COMPARISON:  Chest radiograph performed 07/13/2016, and CT of the chest performed 12/21/2015 FINDINGS: Cardiovascular:  There is no evidence of pulmonary embolus. Diffuse coronary artery calcifications are seen. Mild biatrial enlargement is noted. Scattered calcification is seen along the aortic arch. Mild calcification is seen along the proximal great vessels, with mild luminal narrowing at the proximal left subclavian artery. Mediastinum/Nodes: A 1.4 cm azygoesophageal recess node is seen. Right hilar nodes measure up to 1.7 cm in short axis. No pericardial effusion is identified.  The visualized portions of the thyroid gland are unremarkable. No axillary lymphadenopathy is seen. The patient is status post median sternotomy. Lungs/Pleura: Trace right-sided pleural fluid is noted. Extensive bilateral honeycombing is noted, with diffuse fibrotic change and interstitial prominence, significantly worsened from the prior study. Large  areas of crazy paving opacification are seen in both lungs. This raises concern for ARDS or pneumonia. Upper Abdomen: The spleen and visualized portions of liver are unremarkable and appearance. The gallbladder is unremarkable. The visualized portions of the pancreas and adrenal glands are within normal limits. Nonspecific perinephric stranding is noted bilaterally. Musculoskeletal: No acute osseous abnormalities are identified. The visualized musculature is unremarkable in appearance. Review of the MIP images confirms the above findings. IMPRESSION: 1. No evidence of pulmonary embolus. 2. Extensive bilateral honeycombing, with diffuse fibrotic change and interstitial prominence, significantly worsened from the prior study, concerning for worsening chronic interstitial lung disease and fibrosis. 3. Large areas of crazy paving opacification in both lungs. This raises concern for superimposed ARDS or pneumonia, corresponding to the patient's acute symptoms. 4. Diffuse coronary artery calcifications seen. Mild biatrial enlargement noted. 5. Mild luminal narrowing suggested at the proximal left subclavian artery. 6. Right hilar nodes measure up to 1.7 cm in short axis, and a 1.4 cm azygoesophageal recess node is seen. This may reflect underlying infection. Electronically Signed   By: Garald Balding M.D.   On: 07/14/2016 02:03   Dg Chest Port 1 View  Result Date: 07/13/2016 CLINICAL DATA:  Acute onset of shortness of breath and hypoxia. Initial encounter. EXAM: PORTABLE CHEST 1 VIEW COMPARISON:  Chest radiograph performed earlier today at 9:42 a.m. FINDINGS: Bilateral airspace opacities, worse on the right, are perhaps mildly improved from the prior study. This is compatible with slightly improved pneumonia. No pleural effusion or pneumothorax is seen. The cardiomediastinal silhouette is borderline enlarged. The patient is status post median sternotomy with evidence of prior CABG. No acute osseous abnormalities are  seen. IMPRESSION: Slightly improved bilateral pneumonia noted. Borderline cardiomegaly. Electronically Signed   By: Garald Balding M.D.   On: 07/13/2016 21:38   Dg Chest Port 1 View  Result Date: 07/13/2016 CLINICAL DATA:  Cough and weakness EXAM: PORTABLE CHEST 1 VIEW COMPARISON:  Chest radiograph January 22, 2015 and chest CT December 21, 2015 FINDINGS: There is underlying lung fibrosis, most severe in the bases. The interstitium is somewhat prominent in a generalized manner compared to prior studies. There is extensive airspace opacity throughout much of the right lung, a new finding compared to prior studies. Heart size and pulmonary vascularity are normal. Patient is status post coronary artery bypass grafting. There is aortic atherosclerosis. There is no adenopathy. There are no evident bone lesions. IMPRESSION: Extensive airspace opacity throughout much of the right lung, consistent with extensive pneumonia/pneumonitis superimposed on lung fibrosis. Fibrotic changes are primarily noted in the lung bases. There is interstitial prominence bilaterally which is also new compared to previous study. There may be a degree of underlying volume overload or possibly lymphangitic spread of tumor given this change. No adenopathy. Stable cardiac silhouette. Aortic atherosclerosis noted. Electronically Signed   By: Lowella Grip III M.D.   On: 07/13/2016 10:02  CT chest: no PE. Extensive bilateral airspace disease both acute on chronic   ASSESSMENT / PLAN:  Acute Hypoxic respiratory failure  CAP vs viral PNA ILD/atypical UIP  Discussion  Acute hypoxic respiratory failure in setting of severe CAP superimposed on underlying ILD. He is worse.  D-dx include: CAP w/ resistant  organism, viral (favor one of these).  Less likely IPF flare, Pulmonary edema or worsening cancer w/ lymphangitic spread. He is high risk for intubation.   Plan/rec Cont ceftaz Cycle PCTs F/u respiratory virus panel Cont steroid trial F/u  echo IV lasix as BUN/creatinine allows Wean O2-->try high-flow On-going discussion of goals of care   Erick Colace ACNP-BC [place] Pager # [phone] OR # [phone] if no answer  07/14/2016, 9:34 AM

## 2016-07-14 NOTE — Progress Notes (Signed)
Shift event note: Notified by RN that pt found w/ 02 sats in the 50's on 4L [place], very tachypnic (RR-high 30's) and HR in 130's. Apparently pt attempted to get OOB to [facility] w/o 02 and decompensated rather quickly. RR RN was paged and responded to bedside. By the time of my assessment at bedside pt had improved w/ 02 at 15L via NRB. RR improved and pt now appears more comfortable. Pt remains awake and alert and currently in NAD. BBS w/ crackles R>L. Stat ABG and PCXR pending Assessment/Plan: 1. Acute hypoxic respiratory failure: In setting of severe CAP superimposed on underlying ILD vs viral PNA. Improved w/ increased 02 delivery. Pt immediately transferred to SDU. PCCM already following as of earlier today. Discussed pt w/ Dr Titus Mould in [place]. PCCM in department and will see pt at bedside. Updated wife and daughter at bedside. Will continue to monitor closely in SDU. Appreciate PCCM input.   Jeryl Columbia, NP-C [facility] Pager [phone]

## 2016-07-14 NOTE — Progress Notes (Addendum)
Around 2030pm, Family called RN to room to check on patient. He had just gotten up to [facility] (took his O2 off per family) a few minutes earlier. Family reported that patient looked like "he could not catch his breath."  Upon entering room, patient appeared cyanotic, clammy, tachypneic, and was very short of breath.  His O2 sats were in the 50's (on 4l O2 via [place]), HR in the 130's, and RR 35.  Immediately rapid response was notified and respiratory.  AC and MD were also notified. Patient was still responsive but reported feeling terrible. 15 L non-rebreather mask was applied and patient's 02 Sats improved to upper 80's, low 90's.  Orders were received from provider and patient was transferred quickly to ICU.Azzie Glatter [place]

## 2016-07-14 NOTE — Progress Notes (Signed)
*  PRELIMINARY RESULTS* Echocardiogram 2D Echocardiogram has been performed.  Leavy Cella 07/14/2016, 1:57 PM

## 2016-07-14 NOTE — Progress Notes (Signed)
PROGRESS NOTE    Cesar [name]  [id number] DOB: 1936/05/06 DOA: 07/13/2016  PCP: Precious Reel, MD   Brief Narrative:  Cesar [name] is a 80 y.o. male with medical history significant of interstitial lung disease, abdominal aortic aneurysm status post repair, autoimmune hepatitis, carotid stenosis, CAD, hypertension. He has complaints of cough and dyspnea. Outpatient xray showed RML pneumonia. He was noted to by hypoxic and required 4 L in the ER.   Subjective: Dry cough.  No abdominal pain or vomiting. With worsening respiratory status overnight requiring transfer to stepdown Assessment & Plan:   acute hypoxic respiratory failure - Patient with significant deterioration of respiratory status overnight, his baseline on room air, requiring oxygen since admission, currently on NRB 100%. - PCCM consult appreciated, patient is high risk for intubation. - In the setting of severe CAP superimposed on underlying ILD, CAP with resistant organism, vs viral, less likely IPF flare, but empirically on steroids, less likely pulmonary edema. - CTA chest negative for PE  Severe CAP (community acquired pneumonia) - Possibly with resistant organism, versus viral - Continue with IV Fortaz and azithromycin - Possible viral etiology, follow on respiratory panel, on Tamiflu empirically - We'll start on Mucinex, chest PT and flutter valve  ILD - With possible flare, continue with steroids empirically - CT chest showing significantly worsened from the prior study, concerning for worsening chronic interstitial lung disease and fibrosis. - CT chest high resolution ordered by pulmonary pending  CAD (coronary artery disease) -  denies any chest pain, continue with aspirin  - With elevated troponins, but this is most likely the setting of demand ischemia in the setting of acute respiratory failure, non-ACS pattern 0.35>0.37, follow on 2-D echo  Autoimmune hepatitis - Avoid hepatotoxic  medication  Cognitive impairment -Continue Aricept and Namenda  ILD/ restrictive lung disease - follows with Dr Milinda Hirschfeld    AAA (abdominal aortic aneurysm) without ruptur  DVT prophylaxis: [place] heparin Code Status: Full code Family Communication: D/W Son at bedside  Disposition Plan: remains in stepdown Consultants:   PCCM Procedures:    Antimicrobials:  Anti-infectives    Start     Dose/Rate Route Frequency Ordered Stop   07/13/16 2300  oseltamivir (TAMIFLU) capsule 75 mg     75 mg Oral 2 times daily 07/13/16 2258 Aug 04, 2016 2159   07/13/16 1800  cefTAZidime (FORTAZ) 2 g in dextrose 5 % 50 mL IVPB     2 g 100 mL/hr over 30 Minutes Intravenous Every 8 hours 07/13/16 1609     07/12/16 1600  azithromycin (ZITHROMAX) tablet 500 mg     500 mg Oral Every 24 hours 07/12/2016 1756 08/04/16 1559   07/12/16 1000  cefTRIAXone (ROCEPHIN) 1 g in dextrose 5 % 50 mL IVPB  Status:  Discontinued     1 g 100 mL/hr over 30 Minutes Intravenous Every 24 hours 07/05/2016 1756 07/13/16 1556   07/09/2016 1345  cefTRIAXone (ROCEPHIN) 1 g in dextrose 5 % 50 mL IVPB     1 g 100 mL/hr over 30 Minutes Intravenous  Once 07/03/2016 1338 07/10/2016 1511   06/18/2016 1345  azithromycin (ZITHROMAX) 500 mg in dextrose 5 % 250 mL IVPB     500 mg 250 mL/hr over 60 Minutes Intravenous  Once 06/22/2016 1338 06/20/2016 1646       Objective: Vitals:   07/14/16 0500 07/14/16 0600 07/14/16 0800 07/14/16 1016  BP:  (!) 147/67    Pulse:  81    Resp:  (!) 25  Temp: 99.4 F (37.4 C)  97.8 F (36.6 C)   TempSrc: Oral  Axillary   SpO2:  97%  (!) 89%  Weight:      Height:        Intake/Output Summary (Last 24 hours) at 07/14/16 1105 Last data filed at 07/14/16 0700  Gross per 24 hour  Intake           277.35 ml  Output             1250 ml  Net          -972.65 ml   Filed Weights   07/13/16 0445 07/13/16 2150  Weight: 75.4 kg (166 lb 3.6 oz) 79.5 kg (175 lb 4.3 oz)    Examination: General exam: Appears comfortable   on NRB HEENT: PERRLA, oral mucosa moist, no sclera icterus or thrush Respiratory system: bilateral basilar crackles-on 100% NRB,  Cardiovascular system: S1 & S2 heard, RRR.  No murmurs  Gastrointestinal system: Abdomen soft, non-tender, nondistended. Normal bowel sound. No organomegaly Central nervous system: Alert and oriented. No focal neurological deficits. Extremities: No cyanosis, clubbing or edema Skin: No rashes or ulcers Psychiatry:  Mood & affect appropriate.     Data Reviewed: I have personally reviewed following labs and imaging studies  CBC:  Recent Labs Lab 07/02/2016 1423 07/12/16 0436 07/14/16 0349  WBC 15.1* 11.0* 22.7*  NEUTROABS 13.2* 8.4*  --   HGB 10.6* 9.5* 9.7*  HCT 31.5* 29.0* 28.6*  MCV 89.5 88.7 87.5  PLT 459* 381 [id number]*   Basic Metabolic Panel:  Recent Labs Lab 07/02/2016 1423 07/12/16 0436 07/14/16 0349  NA 133* 136 136  K 4.8 3.9 3.6  CL 104 109 107  CO2 20* 22 21*  GLUCOSE 117* 102* 137*  BUN 33* 24* 14  CREATININE 1.23 0.98 1.08  CALCIUM 9.1 8.5* 8.7*  MG  --   --  2.1  PHOS  --   --  2.8   GFR: Estimated Creatinine Clearance: 58.2 mL/min (by C-G formula based on SCr of 1.08 mg/dL). Liver Function Tests:  Recent Labs Lab 07/08/2016 1423 07/12/16 0436  AST 60* 39  ALT 23 19  ALKPHOS 55 48  BILITOT 0.9 0.5  PROT 7.7 6.3*  ALBUMIN 2.9* 2.3*   No results for input(s): LIPASE, AMYLASE in the last 168 hours. No results for input(s): AMMONIA in the last 168 hours. Coagulation Profile:  Recent Labs Lab 07/14/16 0005  INR 1.37   Cardiac Enzymes:  Recent Labs Lab 07/14/16 0005 07/14/16 0744  TROPONINI 0.35* 0.37*   BNP (last 3 results)  Recent Labs  11/16/15 1020  PROBNP 354.5   HbA1C: No results for input(s): HGBA1C in the last 72 hours. CBG: No results for input(s): GLUCAP in the last 168 hours. Lipid Profile: No results for input(s): CHOL, HDL, LDLCALC, TRIG, CHOLHDL, LDLDIRECT in the last 72 hours. Thyroid  Function Tests: No results for input(s): TSH, T4TOTAL, FREET4, T3FREE, THYROIDAB in the last 72 hours. Anemia Panel: No results for input(s): VITAMINB12, FOLATE, FERRITIN, TIBC, IRON, RETICCTPCT in the last 72 hours. Urine analysis:    Component Value Date/Time   COLORURINE YELLOW 08/30/2010 0956   APPEARANCEUR CLEAR 08/30/2010 0956   LABSPEC 1.022 08/30/2010 0956   PHURINE 6.0 08/30/2010 0956   GLUCOSEU NEGATIVE 08/30/2010 0956   HGBUR NEGATIVE 08/30/2010 0956   BILIRUBINUR NEGATIVE 08/30/2010 0956   KETONESUR NEGATIVE 08/30/2010 0956   PROTEINUR NEGATIVE 08/30/2010 0956   UROBILINOGEN 0.2 08/30/2010 0956   NITRITE  NEGATIVE 08/30/2010 0956   LEUKOCYTESUR  08/30/2010 0956    NEGATIVE MICROSCOPIC NOT DONE ON URINES WITH NEGATIVE PROTEIN, BLOOD, LEUKOCYTES, NITRITE, OR GLUCOSE <1000 mg/dL.   Sepsis Labs: @LABRCNTIP (procalcitonin:4,lacticidven:4) ) Recent Results (from the past 240 hour(s))  Culture, blood (routine x 2)     Status: None (Preliminary result)   Collection Time: 07/16/2016  2:32 PM  Result Value Ref Range Status   Specimen Description BLOOD LEFT HAND  Final   Special Requests BOTTLES DRAWN AEROBIC AND ANAEROBIC 5CC  Final   Culture   Final    NO GROWTH 2 DAYS Performed at [facility]    Report Status PENDING  Incomplete  Culture, blood (routine x 2)     Status: None (Preliminary result)   Collection Time: 06/18/2016  2:32 PM  Result Value Ref Range Status   Specimen Description BLOOD RIGHT ARM  Final   Special Requests BOTTLES DRAWN AEROBIC AND ANAEROBIC 5ML  Final   Culture   Final    NO GROWTH 2 DAYS Performed at [facility]    Report Status PENDING  Incomplete  C difficile quick scan w PCR reflex     Status: None   Collection Time: 07/12/16  6:13 AM  Result Value Ref Range Status   C Diff antigen NEGATIVE NEGATIVE Final   C Diff toxin NEGATIVE NEGATIVE Final   C Diff interpretation No C. difficile detected.  Final  MRSA PCR Screening      Status: None   Collection Time: 07/13/16 11:17 PM  Result Value Ref Range Status   MRSA by PCR NEGATIVE NEGATIVE Final    Comment:        The GeneXpert MRSA Assay (FDA approved for NASAL specimens only), is one component of a comprehensive MRSA colonization surveillance program. It is not intended to diagnose MRSA infection nor to guide or monitor treatment for MRSA infections.          Radiology Studies: Ct Angio Chest Pe W Or Wo Contrast  Result Date: 07/14/2016 CLINICAL DATA:  Acute onset of fever and hypoxia. Initial encounter. EXAM: CT ANGIOGRAPHY CHEST WITH CONTRAST TECHNIQUE: Multidetector CT imaging of the chest was performed using the standard protocol during bolus administration of intravenous contrast. Multiplanar CT image reconstructions and MIPs were obtained to evaluate the vascular anatomy. CONTRAST:  100 mL of Isovue 370 IV contrast COMPARISON:  Chest radiograph performed 07/13/2016, and CT of the chest performed 12/21/2015 FINDINGS: Cardiovascular:  There is no evidence of pulmonary embolus. Diffuse coronary artery calcifications are seen. Mild biatrial enlargement is noted. Scattered calcification is seen along the aortic arch. Mild calcification is seen along the proximal great vessels, with mild luminal narrowing at the proximal left subclavian artery. Mediastinum/Nodes: A 1.4 cm azygoesophageal recess node is seen. Right hilar nodes measure up to 1.7 cm in short axis. No pericardial effusion is identified. The visualized portions of the thyroid gland are unremarkable. No axillary lymphadenopathy is seen. The patient is status post median sternotomy. Lungs/Pleura: Trace right-sided pleural fluid is noted. Extensive bilateral honeycombing is noted, with diffuse fibrotic change and interstitial prominence, significantly worsened from the prior study. Large areas of crazy paving opacification are seen in both lungs. This raises concern for ARDS or pneumonia. Upper Abdomen:  The spleen and visualized portions of liver are unremarkable and appearance. The gallbladder is unremarkable. The visualized portions of the pancreas and adrenal glands are within normal limits. Nonspecific perinephric stranding is noted bilaterally. Musculoskeletal: No acute osseous abnormalities are  identified. The visualized musculature is unremarkable in appearance. Review of the MIP images confirms the above findings. IMPRESSION: 1. No evidence of pulmonary embolus. 2. Extensive bilateral honeycombing, with diffuse fibrotic change and interstitial prominence, significantly worsened from the prior study, concerning for worsening chronic interstitial lung disease and fibrosis. 3. Large areas of crazy paving opacification in both lungs. This raises concern for superimposed ARDS or pneumonia, corresponding to the patient's acute symptoms. 4. Diffuse coronary artery calcifications seen. Mild biatrial enlargement noted. 5. Mild luminal narrowing suggested at the proximal left subclavian artery. 6. Right hilar nodes measure up to 1.7 cm in short axis, and a 1.4 cm azygoesophageal recess node is seen. This may reflect underlying infection. Electronically Signed   By: Garald Balding M.D.   On: 07/14/2016 02:03   Dg Chest Port 1 View  Result Date: 07/13/2016 CLINICAL DATA:  Acute onset of shortness of breath and hypoxia. Initial encounter. EXAM: PORTABLE CHEST 1 VIEW COMPARISON:  Chest radiograph performed earlier today at 9:42 a.m. FINDINGS: Bilateral airspace opacities, worse on the right, are perhaps mildly improved from the prior study. This is compatible with slightly improved pneumonia. No pleural effusion or pneumothorax is seen. The cardiomediastinal silhouette is borderline enlarged. The patient is status post median sternotomy with evidence of prior CABG. No acute osseous abnormalities are seen. IMPRESSION: Slightly improved bilateral pneumonia noted. Borderline cardiomegaly. Electronically Signed   By:  Garald Balding M.D.   On: 07/13/2016 21:38   Dg Chest Port 1 View  Result Date: 07/13/2016 CLINICAL DATA:  Cough and weakness EXAM: PORTABLE CHEST 1 VIEW COMPARISON:  Chest radiograph January 22, 2015 and chest CT December 21, 2015 FINDINGS: There is underlying lung fibrosis, most severe in the bases. The interstitium is somewhat prominent in a generalized manner compared to prior studies. There is extensive airspace opacity throughout much of the right lung, a new finding compared to prior studies. Heart size and pulmonary vascularity are normal. Patient is status post coronary artery bypass grafting. There is aortic atherosclerosis. There is no adenopathy. There are no evident bone lesions. IMPRESSION: Extensive airspace opacity throughout much of the right lung, consistent with extensive pneumonia/pneumonitis superimposed on lung fibrosis. Fibrotic changes are primarily noted in the lung bases. There is interstitial prominence bilaterally which is also new compared to previous study. There may be a degree of underlying volume overload or possibly lymphangitic spread of tumor given this change. No adenopathy. Stable cardiac silhouette. Aortic atherosclerosis noted. Electronically Signed   By: Lowella Grip III M.D.   On: 07/13/2016 10:02      Scheduled Meds: . aspirin EC  81 mg Oral Daily  . azithromycin  500 mg Oral Q24H  . cefTAZidime (FORTAZ)  IV  2 g Intravenous Q8H  . donepezil  5 mg Oral QHS  . feeding supplement (ENSURE ENLIVE)  237 mL Oral BID BM  . fenofibrate  160 mg Oral Daily  . fluticasone  2 spray Each Nare Daily  . gabapentin  200 mg Oral TID  . guaiFENesin  1,200 mg Oral BID  . [START ON 07/15/2016] heparin subcutaneous  5,000 Units Subcutaneous Q8H  . ipratropium-albuterol  3 mL Nebulization TID  . loratadine  10 mg Oral Daily  . memantine  10 mg Oral BID  . methylPREDNISolone (SOLU-MEDROL) injection  125 mg Intravenous Q6H  . oseltamivir  75 mg Oral BID  . sertraline  100 mg  Oral Daily   Continuous Infusions:     LOS: 2 days  Time Spent: 35 minutes  Sriya Kroeze, MD [facility] [email or web address] Password [email or web address] 07/14/2016, 11:05 AM

## 2016-07-14 NOTE — Significant Event (Signed)
Rapid Response Event Note  Overview: Time Called: 2050 Arrival Time: 2055 Event Type: Respiratory  Initial Focused Assessment: Patient had gotten up to [facility] without O2, prior to arrival. He had respiratory distress sats dropped to mid 70's. Upon arrival he was on NRB and very labored respirations, sats labile 84-91. He was unable to maintain sats consistently. Alert and oriented to place and person. No reserve for any movement.   Interventions: ABG, NRB, PCXR--Seen by PCCM upon arrival to ICU/SD.   Plan of Care (if not transferred):  Event Summary: Name of Physician Notified: Chaney Malling NP at 2110    at    Outcome: Transferred (Comment) ([id number])  Event End Time: 2130  Pricilla Riffle

## 2016-07-14 NOTE — Progress Notes (Signed)
Date:  July 14, 2016 Chart reviewed for concurrent status and case management needs. Will continue to follow the patient for status change: transferred to icu due to continued hypoxia and no improvement in pna, on non rebreather mask and hfnc 02.  Discharge Planning: following for needs Expected discharge date: [id number] Velva Harman, BSN, [place], [place]

## 2016-07-14 NOTE — Progress Notes (Signed)
CRITICAL VALUE ALERT  Critical value received:  troponin 0.35  Date of notification:  07/14/16  Time of notification:  0100  Critical value read back:yes  Nurse who received alert:  Benjamin Stain Rn  MD notified (1st page):  E-link MD notified via [place] RN  Time of first page: 0115--No further orders

## 2016-07-15 ENCOUNTER — Inpatient Hospital Stay (HOSPITAL_COMMUNITY): Payer: Medicare Other

## 2016-07-15 LAB — BASIC METABOLIC PANEL
ANION GAP: 6 (ref 5–15)
BUN: 27 mg/dL — ABNORMAL HIGH (ref 6–20)
CO2: 25 mmol/L (ref 22–32)
Calcium: 8.9 mg/dL (ref 8.9–10.3)
Chloride: 106 mmol/L (ref 101–111)
Creatinine, Ser: 1.02 mg/dL (ref 0.61–1.24)
GFR calc Af Amer: >60 mL/min (ref >60)
GLUCOSE: 173 mg/dL — AB (ref 65–99)
POTASSIUM: 4.5 mmol/L (ref 3.5–5.1)
Sodium: 137 mmol/L (ref 135–145)

## 2016-07-15 LAB — CBC
HEMATOCRIT: 28 % — AB (ref 39.0–52.0)
Hemoglobin: 9.4 g/dL — ABNORMAL LOW (ref 13.0–17.0)
MCH: 29.7 pg (ref 26.0–34.0)
MCHC: 33.6 g/dL (ref 30.0–36.0)
MCV: 88.6 fL (ref 78.0–100.0)
PLATELETS: 461 10*3/uL — AB (ref 150–400)
RBC: 3.16 MIL/uL — AB (ref 4.22–5.81)
RDW: 13.9 % (ref 11.5–15.5)
WBC: 21 10*3/uL — AB (ref 4.0–10.5)

## 2016-07-15 LAB — PROCALCITONIN: Procalcitonin: 0.62 ng/mL

## 2016-07-15 LAB — GLUCOSE, CAPILLARY
GLUCOSE-CAPILLARY: 200 mg/dL — AB (ref 65–99)
GLUCOSE-CAPILLARY: 156 mg/dL — AB (ref 65–99)
Glucose-Capillary: 179 mg/dL — ABNORMAL HIGH (ref 65–99)
Glucose-Capillary: 195 mg/dL — ABNORMAL HIGH (ref 65–99)

## 2016-07-15 LAB — EXPECTORATED SPUTUM ASSESSMENT W REFEX TO RESP CULTURE

## 2016-07-15 LAB — MAGNESIUM: Magnesium: 2.4 mg/dL (ref 1.7–2.4)

## 2016-07-15 LAB — PHOSPHORUS: Phosphorus: 2.7 mg/dL (ref 2.5–4.6)

## 2016-07-15 LAB — EXPECTORATED SPUTUM ASSESSMENT W GRAM STAIN, RFLX TO RESP C: Special Requests: NORMAL

## 2016-07-15 NOTE — Progress Notes (Signed)
Placed Bipap at bedside. Pt requested more time to speak with his family before initiation of bipap. Pt is on High Flow nasal cannula of 100% Flow 35% with o2 saturations of 88%. Will check back in 30 minutes to place him on bipap.

## 2016-07-15 NOTE — Progress Notes (Addendum)
PROGRESS NOTE    ONNI BENNIS  [id number] DOB: 1935-11-30 DOA: 06/25/2016  PCP: Precious Reel, MD   Brief Narrative:  BRODRIC STACH is a 80 y.o. male with medical history significant of interstitial lung disease, abdominal aortic aneurysm status post repair, autoimmune hepatitis, carotid stenosis, CAD, hypertension. He has complaints of cough and dyspnea. Outpatient xray showed RML pneumonia. He was noted to by hypoxic and required 4 L in the ER, patient admitted for CAP treatment, continues to have worsening hypoxic respiratory failure during hospital stay, currently progressed to high flow nasal cannula alternating with BiPAP, pulmonary following for respiratory failure.  Subjective: No significant events overnight, denies chest pain, or abdominal pain, occasional cough, nonproductive   Assessment & Plan:   acute hypoxic respiratory failure - Patient with significant deterioration of respiratory status, his baseline on room air, requiring oxygen since admission, currently on high flow nasal cannula 100% flow 35%. - PCCM consult appreciated, patient is high risk for intubation, but very poor prognosis for weaning from the vent,  currently made DO NOT RESUSCITATE, the plan to is to alternate between high flow and BiPAP overnight few days. - In the setting of severe CAP superimposed on underlying ILD, CAP with resistant organism, vs viral, less likely IPF flare, but empirically on steroids, less likely pulmonary edema. - CTA chest negative for PE  Severe CAP (community acquired pneumonia) - Possibly with resistant organism, versus viral - Continue with IV Fortaz and azithromycin - Possible viral etiology, follow on respiratory panel, on Tamiflu empirically - We'll start on Mucinex, chest PT and flutter valve - Worsening leukocytosis in the setting of steroids  ILD - Very likely rapidly progressive IPF flare, continue with high-dose steroids empirically - CT chest showing  significantly worsened from the prior study, concerning for worsening chronic interstitial lung disease and fibrosis. - CT chest high resolution ordered by pulmonary pending  CAD (coronary artery disease) -  denies any chest pain, continue with aspirin  - With elevated troponins, but this is most likely the setting of demand ischemia in the setting of acute respiratory failure, non-ACS pattern 0.35>0.37>.0.28, 2-D echo with no evidence of regional wall motion abnormalities  Autoimmune hepatitis - Avoid hepatotoxic medication  Cognitive impairment -Continue Aricept and Namenda     AAA (abdominal aortic aneurysm) without ruptur  DVT prophylaxis: [place] heparin Code Status: DO NOT RESUSCITATE Family Communication: D/W Son, daughter at bedside  Disposition Plan: remains in stepdown Consultants:   PCCM Procedures:    Antimicrobials:  Anti-infectives    Start     Dose/Rate Route Frequency Ordered Stop   07/13/16 2300  oseltamivir (TAMIFLU) capsule 75 mg     75 mg Oral 2 times daily 07/13/16 2258 2016/07/20 2159   07/13/16 1800  cefTAZidime (FORTAZ) 2 g in dextrose 5 % 50 mL IVPB     2 g 100 mL/hr over 30 Minutes Intravenous Every 8 hours 07/13/16 1609     07/12/16 1600  azithromycin (ZITHROMAX) tablet 500 mg     500 mg Oral Every 24 hours 06/29/2016 1756 07-20-2016 1559   07/12/16 1000  cefTRIAXone (ROCEPHIN) 1 g in dextrose 5 % 50 mL IVPB  Status:  Discontinued     1 g 100 mL/hr over 30 Minutes Intravenous Every 24 hours 07/01/2016 1756 07/13/16 1556   07/14/2016 1345  cefTRIAXone (ROCEPHIN) 1 g in dextrose 5 % 50 mL IVPB     1 g 100 mL/hr over 30 Minutes Intravenous  Once 07/05/2016 1338 06/20/2016  1511   07/13/2016 1345  azithromycin (ZITHROMAX) 500 mg in dextrose 5 % 250 mL IVPB     500 mg 250 mL/hr over 60 Minutes Intravenous  Once 06/19/2016 1338 06/25/2016 1646       Objective: Vitals:   07/15/16 0736 07/15/16 0800 07/15/16 1000 07/15/16 1054  BP:  (!) 160/74 128/63   Pulse:  93 85     Resp:  (!) 29 (!) 27   Temp:  98 F (36.7 C)    TempSrc:  Oral    SpO2: 92% (!) 79% (!) 88% 93%  Weight:      Height:        Intake/Output Summary (Last 24 hours) at 07/15/16 1154 Last data filed at 07/15/16 1012  Gross per 24 hour  Intake              150 ml  Output             1000 ml  Net             -850 ml   Filed Weights   07/13/16 0445 07/13/16 2150  Weight: 75.4 kg (166 lb 3.6 oz) 79.5 kg (175 lb 4.3 oz)    Examination: General exam: Appears comfortable  on High flow nasal cannula HEENT: PERRLA, oral mucosa moist, no sclera icterus or thrush Respiratory system: bilateral basilar crackles, no use of accessory muscle Cardiovascular system: S1 & S2 heard, RRR.  No murmurs  Gastrointestinal system: Abdomen soft, non-tender, nondistended. Normal bowel sound. No organomegaly Central nervous system: Alert and oriented. No focal neurological deficits. Extremities: No cyanosis, clubbing or edema Skin: No rashes or ulcers Psychiatry:  Mood & affect appropriate.     Data Reviewed: I have personally reviewed following labs and imaging studies  CBC:  Recent Labs Lab 07/04/2016 1423 07/12/16 0436 07/14/16 0349 07/15/16 0413  WBC 15.1* 11.0* 22.7* 21.0*  NEUTROABS 13.2* 8.4*  --   --   HGB 10.6* 9.5* 9.7* 9.4*  HCT 31.5* 29.0* 28.6* 28.0*  MCV 89.5 88.7 87.5 88.6  PLT 459* 381 445* 123456*   Basic Metabolic Panel:  Recent Labs Lab 07/13/2016 1423 07/12/16 0436 07/14/16 0349 07/15/16 0413  NA 133* 136 136 137  K 4.8 3.9 3.6 4.5  CL 104 109 107 106  CO2 20* 22 21* 25  GLUCOSE 117* 102* 137* 173*  BUN 33* 24* 14 27*  CREATININE 1.23 0.98 1.08 1.02  CALCIUM 9.1 8.5* 8.7* 8.9  MG  --   --  2.1 2.4  PHOS  --   --  2.8 2.7   GFR: Estimated Creatinine Clearance: 61.6 mL/min (by C-G formula based on SCr of 1.02 mg/dL). Liver Function Tests:  Recent Labs Lab 06/28/2016 1423 07/12/16 0436  AST 60* 39  ALT 23 19  ALKPHOS 55 48  BILITOT 0.9 0.5  PROT 7.7 6.3*   ALBUMIN 2.9* 2.3*   No results for input(s): LIPASE, AMYLASE in the last 168 hours. No results for input(s): AMMONIA in the last 168 hours. Coagulation Profile:  Recent Labs Lab 07/14/16 0005  INR 1.37   Cardiac Enzymes:  Recent Labs Lab 07/14/16 0005 07/14/16 0744 07/14/16 1533  TROPONINI 0.35* 0.37* 0.28*   BNP (last 3 results)  Recent Labs  11/16/15 1020  PROBNP 354.5   HbA1C: No results for input(s): HGBA1C in the last 72 hours. CBG:  Recent Labs Lab 07/15/16 0819  GLUCAP 179*   Lipid Profile: No results for input(s): CHOL, HDL, LDLCALC, TRIG, CHOLHDL,  LDLDIRECT in the last 72 hours. Thyroid Function Tests: No results for input(s): TSH, T4TOTAL, FREET4, T3FREE, THYROIDAB in the last 72 hours. Anemia Panel: No results for input(s): VITAMINB12, FOLATE, FERRITIN, TIBC, IRON, RETICCTPCT in the last 72 hours. Urine analysis:    Component Value Date/Time   COLORURINE YELLOW 08/30/2010 0956   APPEARANCEUR CLEAR 08/30/2010 0956   LABSPEC 1.022 08/30/2010 0956   PHURINE 6.0 08/30/2010 0956   GLUCOSEU NEGATIVE 08/30/2010 0956   HGBUR NEGATIVE 08/30/2010 0956   BILIRUBINUR NEGATIVE 08/30/2010 0956   KETONESUR NEGATIVE 08/30/2010 0956   PROTEINUR NEGATIVE 08/30/2010 0956   UROBILINOGEN 0.2 08/30/2010 0956   NITRITE NEGATIVE 08/30/2010 0956   LEUKOCYTESUR  08/30/2010 0956    NEGATIVE MICROSCOPIC NOT DONE ON URINES WITH NEGATIVE PROTEIN, BLOOD, LEUKOCYTES, NITRITE, OR GLUCOSE <1000 mg/dL.   Sepsis Labs: @LABRCNTIP (procalcitonin:4,lacticidven:4) ) Recent Results (from the past 240 hour(s))  Culture, blood (routine x 2)     Status: None (Preliminary result)   Collection Time: 06/21/2016  2:32 PM  Result Value Ref Range Status   Specimen Description BLOOD LEFT HAND  Final   Special Requests BOTTLES DRAWN AEROBIC AND ANAEROBIC 5CC  Final   Culture   Final    NO GROWTH 3 DAYS Performed at [facility]    Report Status PENDING  Incomplete  Culture,  blood (routine x 2)     Status: None (Preliminary result)   Collection Time: 06/22/2016  2:32 PM  Result Value Ref Range Status   Specimen Description BLOOD RIGHT ARM  Final   Special Requests BOTTLES DRAWN AEROBIC AND ANAEROBIC 5ML  Final   Culture   Final    NO GROWTH 3 DAYS Performed at [facility]    Report Status PENDING  Incomplete  C difficile quick scan w PCR reflex     Status: None   Collection Time: 07/12/16  6:13 AM  Result Value Ref Range Status   C Diff antigen NEGATIVE NEGATIVE Final   C Diff toxin NEGATIVE NEGATIVE Final   C Diff interpretation No C. difficile detected.  Final  Respiratory Panel by PCR     Status: None   Collection Time: 07/13/16 11:17 PM  Result Value Ref Range Status   Adenovirus NOT DETECTED NOT DETECTED Final   Coronavirus 229E NOT DETECTED NOT DETECTED Final   Coronavirus HKU1 NOT DETECTED NOT DETECTED Final   Coronavirus NL63 NOT DETECTED NOT DETECTED Final   Coronavirus OC43 NOT DETECTED NOT DETECTED Final   Metapneumovirus NOT DETECTED NOT DETECTED Final   Rhinovirus / Enterovirus NOT DETECTED NOT DETECTED Final   Influenza A NOT DETECTED NOT DETECTED Final   Influenza B NOT DETECTED NOT DETECTED Final   Parainfluenza Virus 1 NOT DETECTED NOT DETECTED Final   Parainfluenza Virus 2 NOT DETECTED NOT DETECTED Final   Parainfluenza Virus 3 NOT DETECTED NOT DETECTED Final   Parainfluenza Virus 4 NOT DETECTED NOT DETECTED Final   Respiratory Syncytial Virus NOT DETECTED NOT DETECTED Final   Bordetella pertussis NOT DETECTED NOT DETECTED Final   Chlamydophila pneumoniae NOT DETECTED NOT DETECTED Final   Mycoplasma pneumoniae NOT DETECTED NOT DETECTED Final    Comment: Performed at [facility]  MRSA PCR Screening     Status: None   Collection Time: 07/13/16 11:17 PM  Result Value Ref Range Status   MRSA by PCR NEGATIVE NEGATIVE Final    Comment:        The GeneXpert MRSA Assay (FDA approved for NASAL specimens only),  is one  component of a comprehensive MRSA colonization surveillance program. It is not intended to diagnose MRSA infection nor to guide or monitor treatment for MRSA infections.   Culture, sputum-assessment     Status: None   Collection Time: 07/15/16  9:20 AM  Result Value Ref Range Status   Specimen Description SPUTUM  Final   Special Requests Normal  Final   Sputum evaluation   Final    THIS SPECIMEN IS ACCEPTABLE. RESPIRATORY CULTURE REPORT TO FOLLOW.   Report Status 07/15/2016 FINAL  Final         Radiology Studies: Ct Angio Chest Pe W Or Wo Contrast  Result Date: 07/14/2016 CLINICAL DATA:  Acute onset of fever and hypoxia. Initial encounter. EXAM: CT ANGIOGRAPHY CHEST WITH CONTRAST TECHNIQUE: Multidetector CT imaging of the chest was performed using the standard protocol during bolus administration of intravenous contrast. Multiplanar CT image reconstructions and MIPs were obtained to evaluate the vascular anatomy. CONTRAST:  100 mL of Isovue 370 IV contrast COMPARISON:  Chest radiograph performed 07/13/2016, and CT of the chest performed 12/21/2015 FINDINGS: Cardiovascular:  There is no evidence of pulmonary embolus. Diffuse coronary artery calcifications are seen. Mild biatrial enlargement is noted. Scattered calcification is seen along the aortic arch. Mild calcification is seen along the proximal great vessels, with mild luminal narrowing at the proximal left subclavian artery. Mediastinum/Nodes: A 1.4 cm azygoesophageal recess node is seen. Right hilar nodes measure up to 1.7 cm in short axis. No pericardial effusion is identified. The visualized portions of the thyroid gland are unremarkable. No axillary lymphadenopathy is seen. The patient is status post median sternotomy. Lungs/Pleura: Trace right-sided pleural fluid is noted. Extensive bilateral honeycombing is noted, with diffuse fibrotic change and interstitial prominence, significantly worsened from the prior study. Large areas of  crazy paving opacification are seen in both lungs. This raises concern for ARDS or pneumonia. Upper Abdomen: The spleen and visualized portions of liver are unremarkable and appearance. The gallbladder is unremarkable. The visualized portions of the pancreas and adrenal glands are within normal limits. Nonspecific perinephric stranding is noted bilaterally. Musculoskeletal: No acute osseous abnormalities are identified. The visualized musculature is unremarkable in appearance. Review of the MIP images confirms the above findings. IMPRESSION: 1. No evidence of pulmonary embolus. 2. Extensive bilateral honeycombing, with diffuse fibrotic change and interstitial prominence, significantly worsened from the prior study, concerning for worsening chronic interstitial lung disease and fibrosis. 3. Large areas of crazy paving opacification in both lungs. This raises concern for superimposed ARDS or pneumonia, corresponding to the patient's acute symptoms. 4. Diffuse coronary artery calcifications seen. Mild biatrial enlargement noted. 5. Mild luminal narrowing suggested at the proximal left subclavian artery. 6. Right hilar nodes measure up to 1.7 cm in short axis, and a 1.4 cm azygoesophageal recess node is seen. This may reflect underlying infection. Electronically Signed   By: Garald Balding M.D.   On: 07/14/2016 02:03   Dg Chest Port 1 View  Result Date: 07/15/2016 CLINICAL DATA:  Pneumonia. EXAM: PORTABLE CHEST 1 VIEW COMPARISON:  07/13/2016 FINDINGS: Diffuse airspace disease throughout the right lung is unchanged. There continues to be airspace disease in the mid and lower left lung. The left lung disease has mildly progressed. Heart and mediastinum are stable. Median sternotomy wires are present. Negative for a pneumothorax. No acute bone abnormality. IMPRESSION: Bilateral airspace disease compatible with pneumonia. Mild progression in the left lung. Electronically Signed   By: Markus Daft M.D.   On: 07/15/2016  07:10  Dg Chest Port 1 View  Result Date: 07/13/2016 CLINICAL DATA:  Acute onset of shortness of breath and hypoxia. Initial encounter. EXAM: PORTABLE CHEST 1 VIEW COMPARISON:  Chest radiograph performed earlier today at 9:42 a.m. FINDINGS: Bilateral airspace opacities, worse on the right, are perhaps mildly improved from the prior study. This is compatible with slightly improved pneumonia. No pleural effusion or pneumothorax is seen. The cardiomediastinal silhouette is borderline enlarged. The patient is status post median sternotomy with evidence of prior CABG. No acute osseous abnormalities are seen. IMPRESSION: Slightly improved bilateral pneumonia noted. Borderline cardiomegaly. Electronically Signed   By: Garald Balding M.D.   On: 07/13/2016 21:38      Scheduled Meds: . aspirin EC  81 mg Oral Daily  . azithromycin  500 mg Oral Q24H  . cefTAZidime (FORTAZ)  IV  2 g Intravenous Q8H  . donepezil  5 mg Oral QHS  . feeding supplement (ENSURE ENLIVE)  237 mL Oral BID BM  . fenofibrate  160 mg Oral Daily  . fluticasone  2 spray Each Nare Daily  . gabapentin  200 mg Oral TID  . guaiFENesin  1,200 mg Oral BID  . heparin subcutaneous  5,000 Units Subcutaneous Q8H  . ipratropium-albuterol  3 mL Nebulization TID  . loratadine  10 mg Oral Daily  . mouth rinse  15 mL Mouth Rinse BID  . memantine  10 mg Oral BID  . methylPREDNISolone (SOLU-MEDROL) injection  125 mg Intravenous Q6H  . oseltamivir  75 mg Oral BID  . sertraline  100 mg Oral Daily   Continuous Infusions:     LOS: 3 days   Time Spent: 35 minutes  Trenee Igoe, MD [facility] [id number] [email or web address] Password [facility] 07/15/2016, 11:54 AM

## 2016-07-15 NOTE — Progress Notes (Signed)
Name: Cesar [name] MRN: [id number] DOB: [birth year]    ADMISSION DATE:  06/24/2016 CONSULTATION DATE:  9/27  REFERRING MD :  Elgergawy   CHIEF COMPLAINT:  Pneumonia not responding to typical therapy   BRIEF PATIENT DESCRIPTION:  This is a 80 year old male f/b [name] for presumptive  ILD/atypical UIP pattern as well RUL nodule, both which which have been stable. Admitted on 9/25 w/ working dx of CAP. PCCM asked to see on 9/27 when his CXR had worsened and was still requiring higher levels of FIO2.   SIGNIFICANT EVENTS  9/28 moved to ICU CT worse 9/28 CT chest: 1. No evidence of pulmonary embolus.2. Extensive bilateral honeycombing, with diffuse fibrotic change and interstitial prominence, significantly worsened from the prior study, concerning for worsening chronic interstitial lung disease and fibrosis.3. Large areas of crazy paving opacification in both lungs. This raises concern for superimposed ARDS or pneumonia, corresponding to the patient's acute symptoms. 4. Diffuse coronary artery calcifications seen. Mild biatrial enlargement noted. 5. Mild luminal narrowing suggested at the proximal left subclavian artery. 6. Right hilar nodes measure up to 1.7 cm in short axis, and a 1.4 cm azygoesophageal recess node is seen. This may reflect underlying infection. STUDIES:  RVP 9/27>>>  SUBJECTIVE:  Feels about the same VITAL SIGNS: Temp:  [97.1 F (36.2 C)-98 F (36.7 C)] 98 F (36.7 C) (09/29 0800) Pulse Rate:  [60-89] 60 (09/29 0600) Resp:  [16-26] 18 (09/29 0600) BP: (119-161)/(58-93) 149/70 (09/29 0600) SpO2:  [84 %-97 %] 92 % (09/29 0736) FiO2 (%):  [100 %] 100 % (09/29 0736) 100% NRB PHYSICAL EXAMINATION: General:  Well developed white male, breathing is more labored on high-flow oxygen Neuro:  Awake, oriented. No focal def. Has some memory deficits HEENT:  [place]. No JVD, MMM, no JVD  Cardiovascular:  Rrr, no MRG Lungs:  Dry velco sounding crackles posterior + accessory  use at rest  Abdomen:  Soft, not tender + bowel sounds Musculoskeletal:  Equal st and bulk  Skin:  Warm and dry    Recent Labs Lab 07/12/16 0436 07/14/16 0349 07/15/16 0413  NA 136 136 137  K 3.9 3.6 4.5  CL 109 107 106  CO2 22 21* 25  BUN 24* 14 27*  CREATININE 0.98 1.08 1.02  GLUCOSE 102* 137* 173*    Recent Labs Lab 07/12/16 0436 07/14/16 0349 07/15/16 0413  HGB 9.5* 9.7* 9.4*  HCT 29.0* 28.6* 28.0*  WBC 11.0* 22.7* 21.0*  PLT 381 445* 461*   Ct Angio Chest Pe W Or Wo Contrast  Result Date: 07/14/2016 CLINICAL DATA:  Acute onset of fever and hypoxia. Initial encounter. EXAM: CT ANGIOGRAPHY CHEST WITH CONTRAST TECHNIQUE: Multidetector CT imaging of the chest was performed using the standard protocol during bolus administration of intravenous contrast. Multiplanar CT image reconstructions and MIPs were obtained to evaluate the vascular anatomy. CONTRAST:  100 mL of Isovue 370 IV contrast COMPARISON:  Chest radiograph performed 07/13/2016, and CT of the chest performed 12/21/2015 FINDINGS: Cardiovascular:  There is no evidence of pulmonary embolus. Diffuse coronary artery calcifications are seen. Mild biatrial enlargement is noted. Scattered calcification is seen along the aortic arch. Mild calcification is seen along the proximal great vessels, with mild luminal narrowing at the proximal left subclavian artery. Mediastinum/Nodes: A 1.4 cm azygoesophageal recess node is seen. Right hilar nodes measure up to 1.7 cm in short axis. No pericardial effusion is identified. The visualized portions of the thyroid gland are unremarkable. No axillary lymphadenopathy is  seen. The patient is status post median sternotomy. Lungs/Pleura: Trace right-sided pleural fluid is noted. Extensive bilateral honeycombing is noted, with diffuse fibrotic change and interstitial prominence, significantly worsened from the prior study. Large areas of crazy paving opacification are seen in both lungs. This raises  concern for ARDS or pneumonia. Upper Abdomen: The spleen and visualized portions of liver are unremarkable and appearance. The gallbladder is unremarkable. The visualized portions of the pancreas and adrenal glands are within normal limits. Nonspecific perinephric stranding is noted bilaterally. Musculoskeletal: No acute osseous abnormalities are identified. The visualized musculature is unremarkable in appearance. Review of the MIP images confirms the above findings. IMPRESSION: 1. No evidence of pulmonary embolus. 2. Extensive bilateral honeycombing, with diffuse fibrotic change and interstitial prominence, significantly worsened from the prior study, concerning for worsening chronic interstitial lung disease and fibrosis. 3. Large areas of crazy paving opacification in both lungs. This raises concern for superimposed ARDS or pneumonia, corresponding to the patient's acute symptoms. 4. Diffuse coronary artery calcifications seen. Mild biatrial enlargement noted. 5. Mild luminal narrowing suggested at the proximal left subclavian artery. 6. Right hilar nodes measure up to 1.7 cm in short axis, and a 1.4 cm azygoesophageal recess node is seen. This may reflect underlying infection. Electronically Signed   By: Garald Balding M.D.   On: 07/14/2016 02:03   Dg Chest Port 1 View  Result Date: 07/15/2016 CLINICAL DATA:  Pneumonia. EXAM: PORTABLE CHEST 1 VIEW COMPARISON:  07/13/2016 FINDINGS: Diffuse airspace disease throughout the right lung is unchanged. There continues to be airspace disease in the mid and lower left lung. The left lung disease has mildly progressed. Heart and mediastinum are stable. Median sternotomy wires are present. Negative for a pneumothorax. No acute bone abnormality. IMPRESSION: Bilateral airspace disease compatible with pneumonia. Mild progression in the left lung. Electronically Signed   By: Markus Daft M.D.   On: 07/15/2016 07:10   Dg Chest Port 1 View  Result Date: 07/13/2016 CLINICAL  DATA:  Acute onset of shortness of breath and hypoxia. Initial encounter. EXAM: PORTABLE CHEST 1 VIEW COMPARISON:  Chest radiograph performed earlier today at 9:42 a.m. FINDINGS: Bilateral airspace opacities, worse on the right, are perhaps mildly improved from the prior study. This is compatible with slightly improved pneumonia. No pleural effusion or pneumothorax is seen. The cardiomediastinal silhouette is borderline enlarged. The patient is status post median sternotomy with evidence of prior CABG. No acute osseous abnormalities are seen. IMPRESSION: Slightly improved bilateral pneumonia noted. Borderline cardiomegaly. Electronically Signed   By: Garald Balding M.D.   On: 07/13/2016 21:38   Dg Chest Port 1 View  Result Date: 07/13/2016 CLINICAL DATA:  Cough and weakness EXAM: PORTABLE CHEST 1 VIEW COMPARISON:  Chest radiograph January 22, 2015 and chest CT December 21, 2015 FINDINGS: There is underlying lung fibrosis, most severe in the bases. The interstitium is somewhat prominent in a generalized manner compared to prior studies. There is extensive airspace opacity throughout much of the right lung, a new finding compared to prior studies. Heart size and pulmonary vascularity are normal. Patient is status post coronary artery bypass grafting. There is aortic atherosclerosis. There is no adenopathy. There are no evident bone lesions. IMPRESSION: Extensive airspace opacity throughout much of the right lung, consistent with extensive pneumonia/pneumonitis superimposed on lung fibrosis. Fibrotic changes are primarily noted in the lung bases. There is interstitial prominence bilaterally which is also new compared to previous study. There may be a degree of underlying volume overload or possibly  lymphangitic spread of tumor given this change. No adenopathy. Stable cardiac silhouette. Aortic atherosclerosis noted. Electronically Signed   By: Lowella Grip III M.D.   On: 07/13/2016 10:02  CT chest: no PE. 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 He is worse.  D-dx include: CAP (thus far NOS) w/ ARDS (although he does not appear toxic) Initially fely Less likely IPF flare but clinical picture and radiographic changes now raising concerns that this is indeed rapidly progressive IPF flare. We had a very long d/w the pt, his wife and daughter re: goals of care and prognosis (see separate note). He does not want ETT or mechanical ventilation.    Plan/rec Cont ceftaz Cycle PCTs Cont steroid trial IV lasix as BUN/creatinine allows Wean O2-->try high-flow & BIPAP cycling  Full DNR  Pt is prepared to transition to comfort if need be   Erick Colace ACNP-BC [place] Pager # [phone] OR # [phone] if no answer  07/15/2016, 9:41 AM

## 2016-07-15 NOTE — Progress Notes (Signed)
Interdisciplinary Goals of Care Family Meeting    Date carried out:: 07/15/2016   Location of the meeting: Bedside  Member's involved: Nurse Practitioner, Bedside Registered Nurse and Family Member or next of kin  Durable Power of Attorney or acting medical decision maker: the patient, wife and daughter     Discussion: We discussed goals of care for [employer] .  We had a long discussion about the current clinical findings, all possible diagnoses, and treatment options. This is either severe ARDS in setting of CAP (NOS) or rapidly progressive IPF flare. In either situation should he require intubation prognosis for weaning of the vent is poor and chance of prolonged ventilation and poor outcome very high. He spoke frankly about death. Desi is not afraid to die. He does not want to be supported long term on a life support machine and he does not want to put his family in a situation where they might be forced to make a decision to remove him from life support. Based on this we agreed on DNR/DNI status. We remain hopeful that w/ time he may improve. He is in agreement with trying to cycle between high-flow oxygen and BIPAP. He and his family understand that if this is unsuccessful, he can't wean from it,  OR if he can't tolerate this we will be at the point where we will need to soon transition to comfort.   Code status: Full DNR  Disposition: Continue current acute care  Time spent for the meeting: 45 minutes  Clementeen Graham 07/15/2016 9:51 AM

## 2016-07-15 NOTE — Progress Notes (Signed)
Pharmacy Antibiotic Note  Cesar [name] is a 80 y.o. male admitted on 07/15/2016 with pneumonia.  Pharmacy has been consulted for ceftazidime dosing. Pt has severe CAP superimposed on underlying ILD.  Pt is being transitioned from ceftriaxone to ceftazidime due to lack of response to treatment.   Today, 07/15/2016: Day #4 antibiotics  Renal: SCr WNL  WBC remains elevated (on steroids)  Increase in PCT  afebrile  Plan:  Continue ceftazidime 2gm IV q8h  Consider stop tamiflu with negative virus panel  Respiratory culture pending   Follow PCT  Height: 5\' 10"  (177.8 cm) Weight: 175 lb 4.3 oz (79.5 kg) IBW/kg (Calculated) : 73  Temp (24hrs), Avg:97.7 F (36.5 C), Min:97.6 F (36.4 C), Max:98 F (36.7 C)   Recent Labs Lab 07/15/2016 1423 07/12/2016 1438 07/12/16 0436 07/14/16 0005 07/14/16 0349 07/15/16 0413  WBC 15.1*  --  11.0*  --  22.7* 21.0*  CREATININE 1.23  --  0.98  --  1.08 1.02  LATICACIDVEN  --  0.99  --  1.5  --   --     Estimated Creatinine Clearance: 61.6 mL/min (by C-G formula based on SCr of 1.02 mg/dL).    Allergies  Allergen Reactions  . Zocor [Simvastatin] Other (See Comments)    "bothers his liver"     Antimicrobials this admission:  9/25 azithromycin  >> (10/2) 9/25 ceftriaxone >> 9/27 9/27 ceftazidime >>  9/28 tamiflu>>  Dose adjustments this admission:  ---  Microbiology results:  9/25 BCx x2: NGTD 9/26 C. diff: negative  9/26 Strep pneumo: negative 9/26 Legionella: negative 9/26: HIV antibody: negative  9/27 resp panel PCR: neg 9/27 MRSA PCR: neg 9/29 sputum: pending  Thank you for allowing pharmacy to be a part of this patient's care.  Doreene Eland, PharmD, BCPS.   Pager: [phone] 07/15/2016 12:14 PM

## 2016-07-16 ENCOUNTER — Inpatient Hospital Stay (HOSPITAL_COMMUNITY): Payer: Medicare Other

## 2016-07-16 DIAGNOSIS — Z515 Encounter for palliative care: Secondary | ICD-10-CM

## 2016-07-16 DIAGNOSIS — R0600 Dyspnea, unspecified: Secondary | ICD-10-CM

## 2016-07-16 DIAGNOSIS — Z7189 Other specified counseling: Secondary | ICD-10-CM

## 2016-07-16 LAB — GLUCOSE, CAPILLARY
GLUCOSE-CAPILLARY: 162 mg/dL — AB (ref 65–99)
Glucose-Capillary: 170 mg/dL — ABNORMAL HIGH (ref 65–99)
Glucose-Capillary: 210 mg/dL — ABNORMAL HIGH (ref 65–99)

## 2016-07-16 LAB — CULTURE, BLOOD (ROUTINE X 2)
Culture: NO GROWTH
Culture: NO GROWTH

## 2016-07-16 LAB — PHOSPHORUS: Phosphorus: 2.7 mg/dL (ref 2.5–4.6)

## 2016-07-16 LAB — MAGNESIUM: MAGNESIUM: 2.6 mg/dL — AB (ref 1.7–2.4)

## 2016-07-16 MED ORDER — HYDRALAZINE HCL 20 MG/ML IJ SOLN
5.0000 mg | Freq: Four times a day (QID) | INTRAMUSCULAR | Status: DC | PRN
  Administered 2016-07-16: 5 mg via INTRAVENOUS
  Filled 2016-07-16: qty 1

## 2016-07-16 MED ORDER — INSULIN ASPART 100 UNIT/ML ~~LOC~~ SOLN
0.0000 [IU] | Freq: Three times a day (TID) | SUBCUTANEOUS | Status: DC
  Administered 2016-07-16: 3 [IU] via SUBCUTANEOUS

## 2016-07-16 MED ORDER — FUROSEMIDE 10 MG/ML IJ SOLN
20.0000 mg | Freq: Two times a day (BID) | INTRAMUSCULAR | Status: AC
  Administered 2016-07-16 (×2): 20 mg via INTRAVENOUS
  Filled 2016-07-16 (×2): qty 2

## 2016-07-16 MED ORDER — PANTOPRAZOLE SODIUM 40 MG PO TBEC
40.0000 mg | DELAYED_RELEASE_TABLET | Freq: Every day | ORAL | Status: DC
  Administered 2016-07-16: 40 mg via ORAL
  Filled 2016-07-16: qty 1

## 2016-07-16 MED ORDER — MORPHINE SULFATE (PF) 2 MG/ML IV SOLN
INTRAVENOUS | Status: AC
  Filled 2016-07-16: qty 1

## 2016-07-16 MED ORDER — SENNOSIDES-DOCUSATE SODIUM 8.6-50 MG PO TABS: 2.0000 | ORAL_TABLET | Freq: Two times a day (BID) | ORAL | Status: DC

## 2016-07-16 MED ORDER — SENNOSIDES-DOCUSATE SODIUM 8.6-50 MG PO TABS
1.0000 | ORAL_TABLET | Freq: Two times a day (BID) | ORAL | Status: DC
Start: 2016-07-16 — End: 2016-07-16
  Administered 2016-07-16: 1 via ORAL
  Filled 2016-07-16: qty 1

## 2016-07-16 MED ORDER — GLYCOPYRROLATE 0.2 MG/ML IJ SOLN
0.2000 mg | INTRAMUSCULAR | Status: DC | PRN
  Filled 2016-07-16: qty 1

## 2016-07-16 MED ORDER — ONDANSETRON HCL 4 MG/2ML IJ SOLN: 4.0000 mg | Freq: Four times a day (QID) | INTRAMUSCULAR | Status: DC | PRN

## 2016-07-16 MED ORDER — MORPHINE SULFATE (PF) 2 MG/ML IV SOLN
2.0000 mg | INTRAVENOUS | Status: DC | PRN
  Administered 2016-07-16 – 2016-07-18 (×10): 2 mg via INTRAVENOUS
  Filled 2016-07-16 (×9): qty 1

## 2016-07-16 MED ORDER — HALOPERIDOL LACTATE 5 MG/ML IJ SOLN
1.0000 mg | Freq: Four times a day (QID) | INTRAMUSCULAR | Status: DC | PRN
  Administered 2016-07-18: 1 mg via INTRAVENOUS
  Filled 2016-07-16: qty 1

## 2016-07-16 MED ORDER — LORAZEPAM 2 MG/ML IJ SOLN
1.0000 mg | INTRAMUSCULAR | Status: DC | PRN
  Administered 2016-07-18 (×2): 1 mg via INTRAVENOUS
  Filled 2016-07-16 (×2): qty 1

## 2016-07-16 NOTE — Progress Notes (Signed)
PROGRESS NOTE    Cesar [name]  [id number] DOB: 1936-07-20 DOA: 07/09/2016  PCP: Precious Reel, MD   Brief Narrative:  Cesar [name] is a 80 y.o. male with medical history significant of interstitial lung disease, abdominal aortic aneurysm status post repair, autoimmune hepatitis, carotid stenosis, CAD, hypertension. He has complaints of cough and dyspnea. Outpatient xray showed RML pneumonia. He was noted to by hypoxic and required 4 L in the ER, patient admitted for CAP treatment, continues to have worsening hypoxic respiratory failure during hospital stay, currently progressed to high flow nasal cannula alternating with BiPAP, pulmonary following for respiratory failure.  Subjective: No significant events overnight, denies chest pain, or abdominal pain, occasional cough, nonproductive   Assessment & Plan:   acute hypoxic respiratory failure - Patient with significant deterioration of respiratory status, his baseline on room air, requiring oxygen since admission, currently on high flow nasal cannula 100% high flow 35%. - PCCM consult appreciated, patient is high risk for intubation, but very poor prognosis for weaning from the vent,  currently made DO NOT RESUSCITATE, the plan to is to alternate between high flow and BiPAP overnight few days. - In the setting of severe CAP superimposed on underlying ILD, CAP with resistant organism, vs viral, as well  IPF flare, empirically on steroids, less likely pulmonary edema. - CTA chest negative for PE  Severe CAP (community acquired pneumonia) - Possibly with resistant organism, versus viral - Continue with IV Fortaz and azithromycin - Possible viral etiology, follow on respiratory panel, on Tamiflu empirically - Worsening leukocytosis in the setting of steroids  ILD - Very likely rapidly progressive IPF flare, continue with high-dose steroids empirically - CT chest showing significantly worsened from the prior study, concerning  for worsening chronic interstitial lung disease and fibrosis.  CAD (coronary artery disease) -  denies any chest pain, continue with aspirin  - With elevated troponins, but this is most likely the setting of demand ischemia in the setting of acute respiratory failure, non-ACS pattern 0.35>0.37>.0.28, 2-D echo with no evidence of regional wall motion abnormalities  Autoimmune hepatitis - Avoid hepatotoxic medication  Cognitive impairment -Continue Aricept and Namenda     AAA (abdominal aortic aneurysm) without ruptur  DVT prophylaxis: [place] heparin Code Status: DO NOT RESUSCITATE Family Communication: D/W Son, daughter at bedside  Disposition Plan: remains in stepdown Consultants:   PCCM Procedures:    Antimicrobials:  Anti-infectives    Start     Dose/Rate Route Frequency Ordered Stop   07/13/16 2300  oseltamivir (TAMIFLU) capsule 75 mg  Status:  Discontinued     75 mg Oral 2 times daily 07/13/16 2258 07/15/16 2101   07/13/16 1800  cefTAZidime (FORTAZ) 2 g in dextrose 5 % 50 mL IVPB     2 g 100 mL/hr over 30 Minutes Intravenous Every 8 hours 07/13/16 1609     07/12/16 1600  azithromycin (ZITHROMAX) tablet 500 mg     500 mg Oral Every 24 hours 07/07/2016 1756 August 09, 2016 1559   07/12/16 1000  cefTRIAXone (ROCEPHIN) 1 g in dextrose 5 % 50 mL IVPB  Status:  Discontinued     1 g 100 mL/hr over 30 Minutes Intravenous Every 24 hours 06/22/2016 1756 07/13/16 1556   06/18/2016 1345  cefTRIAXone (ROCEPHIN) 1 g in dextrose 5 % 50 mL IVPB     1 g 100 mL/hr over 30 Minutes Intravenous  Once 06/26/2016 1338 06/19/2016 1511   07/06/2016 1345  azithromycin (ZITHROMAX) 500 mg in dextrose 5 %  250 mL IVPB     500 mg 250 mL/hr over 60 Minutes Intravenous  Once 07/03/2016 1338 06/20/2016 1646       Objective: Vitals:   07/16/16 0404 07/16/16 0600 07/16/16 0800 07/16/16 1004  BP: (!) 169/70 115/64 (!) 145/63   Pulse: 80 66 84   Resp: 19 18 (!) 24   Temp: 98.5 F (36.9 C)  98 F (36.7 C)   TempSrc:  Axillary  Oral   SpO2: 92% 95% (!) 87% 95%  Weight:      Height:        Intake/Output Summary (Last 24 hours) at 07/16/16 1029 Last data filed at 07/16/16 0600  Gross per 24 hour  Intake              460 ml  Output             1000 ml  Net             -540 ml   Filed Weights   07/13/16 0445 07/13/16 2150  Weight: 75.4 kg (166 lb 3.6 oz) 79.5 kg (175 lb 4.3 oz)    Examination: General exam: Appears comfortable  on High flow nasal cannula HEENT: PERRLA, oral mucosa moist, no sclera icterus or thrush Respiratory system: bilateral basilar crackles, no use of accessory muscle Cardiovascular system: S1 & S2 heard, RRR.  No murmurs  Gastrointestinal system: Abdomen soft, non-tender, nondistended. Normal bowel sound. No organomegaly Central nervous system: Alert and oriented. No focal neurological deficits. Extremities: No cyanosis, clubbing or edema Skin: No rashes or ulcers Psychiatry:  Mood & affect appropriate.     Data Reviewed: I have personally reviewed following labs and imaging studies  CBC:  Recent Labs Lab 06/18/2016 1423 07/12/16 0436 07/14/16 0349 07/15/16 0413  WBC 15.1* 11.0* 22.7* 21.0*  NEUTROABS 13.2* 8.4*  --   --   HGB 10.6* 9.5* 9.7* 9.4*  HCT 31.5* 29.0* 28.6* 28.0*  MCV 89.5 88.7 87.5 88.6  PLT 459* 381 445* 123456*   Basic Metabolic Panel:  Recent Labs Lab 06/23/2016 1423 07/12/16 0436 07/14/16 0349 07/15/16 0413 07/16/16 0350  NA 133* 136 136 137  --   K 4.8 3.9 3.6 4.5  --   CL 104 109 107 106  --   CO2 20* 22 21* 25  --   GLUCOSE 117* 102* 137* 173*  --   BUN 33* 24* 14 27*  --   CREATININE 1.23 0.98 1.08 1.02  --   CALCIUM 9.1 8.5* 8.7* 8.9  --   MG  --   --  2.1 2.4 2.6*  PHOS  --   --  2.8 2.7 2.7   GFR: Estimated Creatinine Clearance: 61.6 mL/min (by C-G formula based on SCr of 1.02 mg/dL). Liver Function Tests:  Recent Labs Lab 06/22/2016 1423 07/12/16 0436  AST 60* 39  ALT 23 19  ALKPHOS 55 48  BILITOT 0.9 0.5  PROT 7.7  6.3*  ALBUMIN 2.9* 2.3*   No results for input(s): LIPASE, AMYLASE in the last 168 hours. No results for input(s): AMMONIA in the last 168 hours. Coagulation Profile:  Recent Labs Lab 07/14/16 0005  INR 1.37   Cardiac Enzymes:  Recent Labs Lab 07/14/16 0005 07/14/16 0744 07/14/16 1533  TROPONINI 0.35* 0.37* 0.28*   BNP (last 3 results)  Recent Labs  11/16/15 1020  PROBNP 354.5   HbA1C: No results for input(s): HGBA1C in the last 72 hours. CBG:  Recent Labs Lab 07/15/16 0819 07/15/16  1245 07/15/16 1658 07/15/16 2206 07/16/16 0821  GLUCAP 179* 200* 156* 195* 170*   Lipid Profile: No results for input(s): CHOL, HDL, LDLCALC, TRIG, CHOLHDL, LDLDIRECT in the last 72 hours. Thyroid Function Tests: No results for input(s): TSH, T4TOTAL, FREET4, T3FREE, THYROIDAB in the last 72 hours. Anemia Panel: No results for input(s): VITAMINB12, FOLATE, FERRITIN, TIBC, IRON, RETICCTPCT in the last 72 hours. Urine analysis:    Component Value Date/Time   COLORURINE YELLOW 08/30/2010 0956   APPEARANCEUR CLEAR 08/30/2010 0956   LABSPEC 1.022 08/30/2010 0956   PHURINE 6.0 08/30/2010 0956   GLUCOSEU NEGATIVE 08/30/2010 0956   HGBUR NEGATIVE 08/30/2010 0956   BILIRUBINUR NEGATIVE 08/30/2010 0956   KETONESUR NEGATIVE 08/30/2010 0956   PROTEINUR NEGATIVE 08/30/2010 0956   UROBILINOGEN 0.2 08/30/2010 0956   NITRITE NEGATIVE 08/30/2010 0956   LEUKOCYTESUR  08/30/2010 0956    NEGATIVE MICROSCOPIC NOT DONE ON URINES WITH NEGATIVE PROTEIN, BLOOD, LEUKOCYTES, NITRITE, OR GLUCOSE <1000 mg/dL.   Sepsis Labs: @LABRCNTIP (procalcitonin:4,lacticidven:4) ) Recent Results (from the past 240 hour(s))  Culture, blood (routine x 2)     Status: None (Preliminary result)   Collection Time: 06/21/2016  2:32 PM  Result Value Ref Range Status   Specimen Description BLOOD LEFT HAND  Final   Special Requests BOTTLES DRAWN AEROBIC AND ANAEROBIC 5CC  Final   Culture   Final    NO GROWTH 4  DAYS Performed at [facility]    Report Status PENDING  Incomplete  Culture, blood (routine x 2)     Status: None (Preliminary result)   Collection Time: 07/10/2016  2:32 PM  Result Value Ref Range Status   Specimen Description BLOOD RIGHT ARM  Final   Special Requests BOTTLES DRAWN AEROBIC AND ANAEROBIC 5ML  Final   Culture   Final    NO GROWTH 4 DAYS Performed at [facility]    Report Status PENDING  Incomplete  C difficile quick scan w PCR reflex     Status: None   Collection Time: 07/12/16  6:13 AM  Result Value Ref Range Status   C Diff antigen NEGATIVE NEGATIVE Final   C Diff toxin NEGATIVE NEGATIVE Final   C Diff interpretation No C. difficile detected.  Final  Respiratory Panel by PCR     Status: None   Collection Time: 07/13/16 11:17 PM  Result Value Ref Range Status   Adenovirus NOT DETECTED NOT DETECTED Final   Coronavirus 229E NOT DETECTED NOT DETECTED Final   Coronavirus HKU1 NOT DETECTED NOT DETECTED Final   Coronavirus NL63 NOT DETECTED NOT DETECTED Final   Coronavirus OC43 NOT DETECTED NOT DETECTED Final   Metapneumovirus NOT DETECTED NOT DETECTED Final   Rhinovirus / Enterovirus NOT DETECTED NOT DETECTED Final   Influenza A NOT DETECTED NOT DETECTED Final   Influenza B NOT DETECTED NOT DETECTED Final   Parainfluenza Virus 1 NOT DETECTED NOT DETECTED Final   Parainfluenza Virus 2 NOT DETECTED NOT DETECTED Final   Parainfluenza Virus 3 NOT DETECTED NOT DETECTED Final   Parainfluenza Virus 4 NOT DETECTED NOT DETECTED Final   Respiratory Syncytial Virus NOT DETECTED NOT DETECTED Final   Bordetella pertussis NOT DETECTED NOT DETECTED Final   Chlamydophila pneumoniae NOT DETECTED NOT DETECTED Final   Mycoplasma pneumoniae NOT DETECTED NOT DETECTED Final    Comment: Performed at [facility]  MRSA PCR Screening     Status: None   Collection Time: 07/13/16 11:17 PM  Result Value Ref Range Status  MRSA by PCR NEGATIVE NEGATIVE Final     Comment:        The GeneXpert MRSA Assay (FDA approved for NASAL specimens only), is one component of a comprehensive MRSA colonization surveillance program. It is not intended to diagnose MRSA infection nor to guide or monitor treatment for MRSA infections.   Culture, sputum-assessment     Status: None   Collection Time: 07/15/16  9:20 AM  Result Value Ref Range Status   Specimen Description SPUTUM  Final   Special Requests Normal  Final   Sputum evaluation   Final    THIS SPECIMEN IS ACCEPTABLE. RESPIRATORY CULTURE REPORT TO FOLLOW.   Report Status 07/15/2016 FINAL  Final  Culture, respiratory (NON-Expectorated)     Status: None (Preliminary result)   Collection Time: 07/15/16  9:20 AM  Result Value Ref Range Status   Specimen Description SPUTUM  Final   Special Requests NONE  Final   Gram Stain   Final    MODERATE WBC PRESENT, PREDOMINANTLY PMN MODERATE YEAST HYPHAL ELEMENTS SEEN MODERATE GRAM POSITIVE COCCI IN CLUSTERS IN TETRADS RARE GRAM NEGATIVE COCCOBACILLI RARE GRAM NEGATIVE DIPLOCOCCI Performed at [facility]    Culture PENDING  Incomplete   Report Status PENDING  Incomplete         Radiology Studies: Dg Chest Port 1 View  Result Date: 07/16/2016 CLINICAL DATA:  Acute lung injury. EXAM: PORTABLE CHEST 1 VIEW COMPARISON:  July 15, 2016 FINDINGS: There is opacity diffusely throughout the right lung, most prominent in the right base. Opacity throughout the left mid lower lung is unchanged. There is sparing of the left apex. No pneumothorax. Stable cardiomediastinal silhouette. No other interval changes. IMPRESSION: Stable bilateral pulmonary opacities, right greater than left. Electronically Signed   By: Dorise Bullion III M.D   On: 07/16/2016 07:23   Dg Chest Port 1 View  Result Date: 07/15/2016 CLINICAL DATA:  Pneumonia. EXAM: PORTABLE CHEST 1 VIEW COMPARISON:  07/13/2016 FINDINGS: Diffuse airspace disease throughout the right lung is unchanged.  There continues to be airspace disease in the mid and lower left lung. The left lung disease has mildly progressed. Heart and mediastinum are stable. Median sternotomy wires are present. Negative for a pneumothorax. No acute bone abnormality. IMPRESSION: Bilateral airspace disease compatible with pneumonia. Mild progression in the left lung. Electronically Signed   By: Markus Daft M.D.   On: 07/15/2016 07:10      Scheduled Meds: . aspirin EC  81 mg Oral Daily  . azithromycin  500 mg Oral Q24H  . cefTAZidime (FORTAZ)  IV  2 g Intravenous Q8H  . donepezil  5 mg Oral QHS  . feeding supplement (ENSURE ENLIVE)  237 mL Oral BID BM  . fenofibrate  160 mg Oral Daily  . fluticasone  2 spray Each Nare Daily  . furosemide  20 mg Intravenous Q12H  . gabapentin  200 mg Oral TID  . guaiFENesin  1,200 mg Oral BID  . heparin subcutaneous  5,000 Units Subcutaneous Q8H  . ipratropium-albuterol  3 mL Nebulization TID  . loratadine  10 mg Oral Daily  . mouth rinse  15 mL Mouth Rinse BID  . memantine  10 mg Oral BID  . methylPREDNISolone (SOLU-MEDROL) injection  125 mg Intravenous Q6H  . pantoprazole  40 mg Oral Daily  . senna-docusate  1 tablet Oral BID  . sertraline  100 mg Oral Daily   Continuous Infusions:     LOS: 4 days   Time Spent:  35 minutes  Breahna Boylen, MD [facility] [email or web address] Password [email or web address] 07/16/2016, 10:29 AM

## 2016-07-16 NOTE — Progress Notes (Signed)
Name: Cesar [name] MRN: [id number] DOB: 1936-06-02    ADMISSION DATE:  06/20/2016 CONSULTATION DATE:  9/27  REFERRING MD :  Elgergawy   CHIEF COMPLAINT:  Pneumonia not responding to typical therapy   BRIEF PATIENT DESCRIPTION:  This is a 80 year old male f/b [name] for presumptive  ILD/atypical UIP pattern as well RUL nodule, both which which have been stable. Admitted on 9/25 w/ working dx of CAP. PCCM asked to see on 9/27 when his CXR had worsened and was still requiring higher levels of FIO2.   SIGNIFICANT EVENTS  9/28 moved to ICU CT worse  STUDIES: CTA Chest 9/28: 1. No evidence of pulmonary embolus.2. Extensive bilateral honeycombing, with diffuse fibrotic change and interstitial prominence, significantly worsened from the prior study, concerning for worsening chronic interstitial lung disease and fibrosis.3. Large areas of crazy paving opacification in both lungs. This raises concern for superimposed ARDS or pneumonia, corresponding to the patient's acute symptoms. 4. Diffuse coronary artery calcifications seen. Mild biatrial enlargement noted. 5. Mild luminal narrowing suggested at the proximal left subclavian artery. 6. Right hilar nodes measure up to 1.7 cm in short axis, and a 1.4 cm azygoesophageal recess node is seen. This may reflect underlying infection.  MICROBIOLOGY:  Blood Ctx x2 9/25 >> MRSA PCR 9/27:  nEGATIVE RVP 9/27:  Negative. Sputum Ctx 9/29 >>  SUBJECTIVE: Patient reports dyspnea remains relatively unchanged. Continuing to have intermittent coughing spells. Denies any chest pain or tightness. Family at bedside. Patient again transitioning between high flow nasal cannula as well as BiPAP support.  REVIEW OF SYSTEMS: Unobtainable as patient is currently on BiPAP support.  VITAL SIGNS: Temp:  [97.8 F (36.6 C)-98.5 F (36.9 C)] 98.5 F (36.9 C) (09/30 0404) Pulse Rate:  [54-85] 66 (09/30 0600) Resp:  [18-29] 18 (09/30 0600) BP: (115-181)/(56-77)  115/64 (09/30 0600) SpO2:  [84 %-99 %] 95 % (09/30 0600) FiO2 (%):  [70 %-100 %] 80 % (09/30 0326)  PHYSICAL EXAMINATION: General:  No distress. Family at bedside. Awake. Neuro:  Moving all 4 extremities equally. Nods to questions. Grossly nonfocal. HEENT:  BiPAP mask in place. No scleral icterus or injection. Cardiovascular:  Regular rate. Regular rhythm. No edema. Lungs:  Basilar Velcro crackles unchanged. Good aeration bilaterally. Normal work of breathing off BiPAP FiO2 0.8. Abdomen:  Soft. Nontender. Normal bowel sounds. Musculoskeletal:  Normal bulk and tone. No joint deformity or effusion appreciated. Skin:  Warm and dry. No rash on exposed skin.    Recent Labs Lab 07/12/16 0436 07/14/16 0349 07/15/16 0413  NA 136 136 137  K 3.9 3.6 4.5  CL 109 107 106  CO2 22 21* 25  BUN 24* 14 27*  CREATININE 0.98 1.08 1.02  GLUCOSE 102* 137* 173*    Recent Labs Lab 07/12/16 0436 07/14/16 0349 07/15/16 0413  HGB 9.5* 9.7* 9.4*  HCT 29.0* 28.6* 28.0*  WBC 11.0* 22.7* 21.0*  PLT 381 445* 461*   Dg Chest Port 1 View  Result Date: 07/16/2016 CLINICAL DATA:  Acute lung injury. EXAM: PORTABLE CHEST 1 VIEW COMPARISON:  July 15, 2016 FINDINGS: There is opacity diffusely throughout the right lung, most prominent in the right base. Opacity throughout the left mid lower lung is unchanged. There is sparing of the left apex. No pneumothorax. Stable cardiomediastinal silhouette. No other interval changes. IMPRESSION: Stable bilateral pulmonary opacities, right greater than left. Electronically Signed   By: Dorise Bullion III M.D   On: 07/16/2016 07:23   Dg Chest [facility]  1 View  Result Date: 07/15/2016 CLINICAL DATA:  Pneumonia. EXAM: PORTABLE CHEST 1 VIEW COMPARISON:  07/13/2016 FINDINGS: Diffuse airspace disease throughout the right lung is unchanged. There continues to be airspace disease in the mid and lower left lung. The left lung disease has mildly progressed. Heart and mediastinum  are stable. Median sternotomy wires are present. Negative for a pneumothorax. No acute bone abnormality. IMPRESSION: Bilateral airspace disease compatible with pneumonia. Mild progression in the left lung. Electronically Signed   By: Markus Daft M.D.   On: 07/15/2016 07:10   ASSESSMENT / PLAN:  80 y.o. male with underlying ILD and acute on chronic hypoxic respiratory failure.Patient has had minimal improvement. Last dose of Lasix was on 9/28. I doubt this is secondary to an infectious process but agree with continuing empiric antibiotics given the asymmetric nature on his imaging. His renal function appears stable on my review of his serum lab work therefore he'll continue diuresis is quite reasonable.  1. Acute on Chronic Hypoxic Respiratory Failure:  Continuing to wean FiO2 for Sat >92%. Continuing Lasix diuresis while monitoring renal function daily w/ IV bid x2 doses. Continuing high flow nasal cannula alternating with BiPAP support for patient comfort. 2. Severe CAP vs ILD Flare:  Awaiting Sputum culture result. Continuing Empiric Fortaz & Azithromycin. Continuing Solu-Medrol 125mg  IV q6hr.  3. Prognosis: Very guarded. Lengthy discussion with the patient's wife at bedside regarding my concern that he may not improve. Family are very reasonable and desired only is comfort and happiness.  Sonia Baller Ashok Cordia, M.D. [facility] Pulmonary & Critical Care Pager:  [phone] After 3pm or if no response, call [phone] 07/16/2016, 8:13 AM

## 2016-07-16 NOTE — Progress Notes (Signed)
Pt and family no longer wanting to hear alarms in room, room monitor transitioned to comfort screen. On-call [facility] paged regarding order for vital signs. RN to discontinue order for vital signs. Cardiac monitored continued at this time. Will address continuation of cardiac monitoring per patient comfort. Will continue to monitor.

## 2016-07-16 NOTE — Progress Notes (Signed)
Patient told RN and family that he was "ready to go" when discussing putting BiPap back on for desaturation.  Palliative care MD notified.  MD discussed with RN and family members (daughter and wife) what the plan is as far as making patient comfortable.  Dr. Chales Salmon also notified of change in patient's request for comfort. Dr. Domingo Cocking with Palliative ordered morphine 2 mg q 15 min and PRN Ativan for comfort.  Family at bedside.

## 2016-07-16 NOTE — Progress Notes (Signed)
PCCM Attending Family Discussion: I spoke with the patient's daughters and granddaughter outside of the patient's room. They feel as though he is more reserved today and seems to be less comfortable with his breathing. They feel he has continued to have a very subtle decline. We discussed the plan to attempt further diuresis today to improve his breathing but if this is ineffective we did discuss palliation with Morphine to relieve any discomfort with breathing and allow nature to take it's course. They did ask about Hospice/Palliative Medicine support for their mom/family. I have contacted Dr. Domingo Cocking with the Palliative Medicine Service. He will reach out to the family but avoid talking with the patient for now until we see how he does.   Sonia Baller Ashok Cordia, M.D. [facility] Pulmonary & Critical Care Pager:  [phone] After 3pm or if no response, call [phone] 1:41 PM 07/16/16

## 2016-07-16 NOTE — Progress Notes (Signed)
Pt was sitting up in bed eating when I arrived. His wife, two daughters, granddaughter and family friend were bedside. Pt and family were very pleasant to visit. He enjoyed being called "honory" by his family. Family mentioned they have a lot of people praying for him. Please page if additional support is needed. Chaplain Ernest Haber, M.Div.   07/16/16 1300  Clinical Encounter Type  Visited With Patient and family together

## 2016-07-16 NOTE — Consult Note (Signed)
Consultation Note Date: 07/16/2016   Patient Name: Cesar [name]  DOB: 1936-10-11  MRN: [id number]  Age / Sex: 80 y.o., male  PCP: Shon Baton, MD Referring Physician: Albertine Patricia, MD  Reason for Consultation: Establishing goals of care and Psychosocial/spiritual support  HPI/Patient Profile: 80 y.o. male  with past medical history of presumed ILD  admitted on 06/23/2016 with PNA and requiring high level FIO2.  Palliative consulted for family support and to facilitate goals as patient with minimal improvement with current therapies.   Clinical Assessment and Goals of Care: I met this afternoon with patient, his wife, his 2 daughters, and one granddaughter.  I had originally discussed with Dr. Ashok Cordia plan to meet family for support and not to further address goals with patient today, however his wife asked me to meet her husband as well.  Or conversation this afternoon consisted of life review and him telling me that he felt that he is coming to the end of his life.  He reports "it has been a worthwhile life and I am ready to go when the time comes."  We discussed plan to continue with current care and reassess tomorrow, however I received call around 1830 that he was "ready to stop."  Discussed with his bedside RN as well as his daughter on the phone that he was refusing Bipap and wanted to focus on comfort.  I ordered dose of morphine as he was dyspnic and proceeded to his room.  On arrival to room this evening, he was surrounded by family and enjoying their company.  He reports that medication relieved his SOB and he was feeling much better.  He is clear in desire to no longer go on Bipap.  He does not want further antibiotics.  He reports that he is ready to die and wants to enjoy what time he has with family.  Once he is no longer awake to do this, he does not want "to linger."    SUMMARY OF  RECOMMENDATIONS   - DNR/DNI - Patient is clear in desire to transition to comfort care.  His family is at bedside and very supportive of this.  Desires to continue high flow [place] and interventions/meds for comfort, but d/c other interventions.   - Addition of comfort meds including morphine, ativan, haldol, robinul as needed. - Dyspnea well controlled on current intermittent dosing and he is awake and able to enjoy his family.  Would have low threshold to transition to continuous infusion if needed to ensure comfort as this is primary goal moving forward.  Code Status/Advance Care Planning:  DNR   Symptom Management:   As above  Palliative Prophylaxis:   Frequent Pain Assessment  Additional Recommendations (Limitations, Scope, Preferences):  Full Comfort Care  Psycho-social/Spiritual:   Additional Recommendations: Grief/Bereavement Support  Prognosis:   Hours - Days  Discharge Planning: Anticipated Hospital Death      Primary Diagnoses: Present on Admission: . CAP (community acquired pneumonia) . ILD (interstitial lung disease) ([place]) . AAA (abdominal aortic  aneurysm) without rupture ([place]) . Autoimmune hepatitis ([place]) . CAD (coronary artery disease) . HYPOTENSION   I have reviewed the medical record, interviewed the patient and family, and examined the patient. The following aspects are pertinent.  Past Medical History:  Diagnosis Date  . AAA (abdominal aortic aneurysm) ([place])   . Arthritis   . Autoimmune hepatitis ([place])   . Cancer ([place])    skin  . Cervicogenic headache   . Coronary atherosclerosis of unspecified type of vessel, native or graft   . Depression   . Hx of cardiovascular stress test    LexiScan Myoview (12/14):  No ischemia, EF 59%, normal study.  . Hyperlipidemia   . Hypertension   . ILD (interstitial lung disease) ([place])   . Memory deficits 09/23/2013  . Occlusion and stenosis of carotid artery without mention of cerebral infarction   .  Osteoarthrosis, unspecified whether generalized or localized, unspecified site   . Other and unspecified hyperlipidemia   . Peripheral vascular disease, unspecified ([place])   . Unspecified hemorrhoids without mention of complication    Social History   Social History  . Marital status: Married    Spouse name: [name]  . Number of children: 4  . Years of education: DOCTORATE   Occupational History  . Retired Retired    Theme park manager   Social History Main Topics  . Smoking status: Former Smoker    Packs/day: 1.00    Years: 40.00    Types: Cigarettes    Start date: 04/25/1960    Quit date: 10/18/1999  . Smokeless tobacco: Never Used     Comment: quit 1970  . Alcohol use 0.0 oz/week     Comment: rare alcohol  . Drug use: No  . Sexual activity: Not Asked   Other Topics Concern  . None   Social History Narrative   Patient lives at home with wife [name].    Patient has 4 children.    Patient is retired.   Patient has a college education.       [facility]:   Originally from [place], [place]. Has also lived in [place], [place], [place], [place]. 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 No pets currently. Had a [place] with his children and a second [place] in [place] just prior to 1985. No mold, asbestos, or hot tub exposure. He has done wood working with treated lumber but always domestic wood. No metal working.    Family History  Problem Relation Age of Onset  . Colon polyps Father   . Heart disease Mother     After age 47  . Hyperlipidemia Mother   . Heart attack Mother   . Multiple sclerosis Sister   . Diabetes Sister   . Heart disease Paternal Grandmother   . Colon cancer Neg Hx   . Lung disease Neg Hx   . Rheumatologic disease Neg Hx    Scheduled Meds: . feeding supplement  (ENSURE ENLIVE)  237 mL Oral BID BM  . furosemide  20 mg Intravenous Q12H  . ipratropium-albuterol  3 mL Nebulization TID  . mouth rinse  15 mL Mouth Rinse BID  . methylPREDNISolone (SOLU-MEDROL) injection  125 mg Intravenous Q6H   Continuous Infusions:  PRN Meds:.acetaminophen, albuterol, benzonatate, diphenoxylate-atropine, glycopyrrolate, haloperidol lactate, LORazepam, morphine  injection, ondansetron (ZOFRAN) IV Medications Prior to Admission:  Prior to Admission medications   Medication Sig Start Date End Date Taking? Authorizing Provider  aspirin 81 MG tablet Take 81 mg by mouth daily.     Yes Historical Provider, MD  benzonatate (TESSALON) 100 MG capsule Take 1-2 capsules (100-200 mg total) by mouth 3 (three) times daily as needed for cough. 01/21/16  Yes Javier Glazier, MD  donepezil (ARICEPT) 5 MG tablet TAKE 1 TABLET (5 MG TOTAL) BY MOUTH AT BEDTIME. 08/20/14  Yes Ward Givens, NP  fenofibrate 160 MG tablet Take 160 mg by mouth daily.     Yes Historical Provider, MD  fluticasone (FLONASE) 50 MCG/ACT nasal spray Place 2 sprays into both nostrils daily.   Yes Historical Provider, MD  gabapentin (NEURONTIN) 100 MG capsule Take 2 capsules (200 mg total) by mouth 3 (three) times daily. 06/28/16  Yes Kathrynn Ducking, MD  loratadine (CLARITIN) 10 MG tablet Take 10 mg by mouth daily.   Yes Historical Provider, MD  meclizine (ANTIVERT) 12.5 MG tablet Take 1 tablet (12.5 mg total) by mouth 3 (three) times daily as needed for dizziness. 01/07/16  Yes Samantha Tripp Dowless, PA-C  memantine (NAMENDA) 10 MG tablet Take 1 tablet (10 mg total) by mouth 2 (two) times daily. 08/20/14  Yes Ward Givens, NP  Multiple Vitamin (MULTIVITAMIN WITH MINERALS) TABS tablet Take 1 tablet by mouth daily.   Yes Historical Provider, MD  NITROSTAT 0.4 MG SL tablet DISSOLVE 1 TABLET UNDER THETONGUE AS NEEDED 03/13/13  Yes Minus Breeding, MD  sertraline (ZOLOFT) 100 MG tablet Once daily 03/29/16  Yes Historical  Provider, MD  traMADol (ULTRAM) 50 MG tablet Take 1 tablet (50 mg total) by mouth every 6 (six) hours as needed for severe pain. 05/23/16  Yes Kathrynn Ducking, MD   Allergies  Allergen Reactions  . Zocor [Simvastatin] Other (See Comments)    "bothers his liver"    Review of Systems  Constitutional: Positive for activity change and appetite change. Negative for fatigue.  Respiratory: Positive for cough and chest tightness. Negative for shortness of breath.   Musculoskeletal: Positive for back pain.  Neurological: Positive for weakness.  Psychiatric/Behavioral: Negative for confusion.    Physical Exam  General: Alert, awake, in no acute distress. High flow [place] in place. HEENT: No bruits, no goiter, no JVD Heart: Regular rate. No murmur appreciated. Lungs: Fair air movement, Bibasilar crackles Abdomen: Soft, nontender, nondistended, positive bowel sounds.  Ext: No significant edema Skin: Warm and dry Neuro: Grossly intact, nonfocal.  Vital Signs: BP (!) 176/93 (BP Location: Right Arm) Comment: MD aware. Order recieved to discontinue vital sign order.  Pulse 88   Temp 97.7 F (36.5 C) (Oral)   Resp (!) 25   Ht _0  (1.778 m)   Wt 79.5 kg (175 lb 4.3 oz)   SpO2 92%   BMI 25.15 kg/m  Pain Assessment: No/denies pain   Pain Score: 0-No pain   SpO2: SpO2: 92 % O2 Device:SpO2: 92 % O2 Flow Rate: .O2 Flow Rate (L/min): 40 L/min  IO: Intake/output summary:  Intake/Output Summary (Last 24 hours) at 07/16/16 2101 Last data filed at 07/16/16 2000  Gross per 24 hour  Intake              750 ml  Output             1450 ml  Net             -700  ml    LBM: Last BM Date: 07/15/16 Baseline Weight: Weight: 75.4 kg (166 lb 3.6 oz) Most recent weight: Weight: 79.5 kg (175 lb 4.3 oz)     Palliative Assessment/Data: 20%     Time: 1500-1545, 1900-1945 Total: 90  Greater than 50%  of this time was spent counseling and coordinating care related to the above assessment and  plan.  Signed by: Micheline Rough, MD   Please contact Palliative Medicine Team phone at [phone] for questions and concerns.  For individual provider: See Shea Evans

## 2016-07-17 DIAGNOSIS — Z515 Encounter for palliative care: Secondary | ICD-10-CM

## 2016-07-17 DIAGNOSIS — R05 Cough: Secondary | ICD-10-CM

## 2016-07-17 DIAGNOSIS — R0602 Shortness of breath: Secondary | ICD-10-CM

## 2016-07-17 MED ORDER — GABAPENTIN 300 MG PO CAPS
300.0000 mg | ORAL_CAPSULE | Freq: Two times a day (BID) | ORAL | Status: DC
  Administered 2016-07-17 (×2): 300 mg via ORAL
  Filled 2016-07-17 (×2): qty 1

## 2016-07-17 MED ORDER — TRAMADOL HCL 50 MG PO TABS
50.0000 mg | ORAL_TABLET | Freq: Once | ORAL | Status: AC
  Administered 2016-07-17: 50 mg via ORAL
  Filled 2016-07-17: qty 1

## 2016-07-17 NOTE — Progress Notes (Signed)
Daily Progress Note   Patient Name: Cesar [name]       Date: 07/17/2016 DOB: Mar 06, 1936  Age: 80 y.o. MRN#: [id number] Attending Physician: Albertine Patricia, MD Primary Care Physician: Precious Reel, MD Admit Date: 07/04/2016  Reason for Consultation/Follow-up: Establishing goals of care  Subjective: Reports having a "pretty good" night.  Used few doses of morphine since yesterday with good relief of his symptoms.  Denies other needs at this time.  Length of Stay: 5  Current Medications: Scheduled Meds:  . feeding supplement (ENSURE ENLIVE)  237 mL Oral BID BM  . gabapentin  300 mg Oral BID  . ipratropium-albuterol  3 mL Nebulization TID  . mouth rinse  15 mL Mouth Rinse BID  . methylPREDNISolone (SOLU-MEDROL) injection  125 mg Intravenous Q6H    Continuous Infusions:    PRN Meds: acetaminophen, albuterol, benzonatate, diphenoxylate-atropine, glycopyrrolate, haloperidol lactate, LORazepam, morphine injection, ondansetron (ZOFRAN) IV  Physical Exam      General: Alert, awake, in no acute distress. High flow [place] in place. HEENT: No bruits, no goiter, no JVD Heart: Regular rate. No murmur appreciated. Lungs: Fair air movement, Bibasilar crackles Abdomen: Soft, nontender, nondistended, positive bowel sounds.  Ext: No significant edema Skin: Warm and dry Neuro: Grossly intact, nonfocal.     Vital Signs: BP (!) 176/93 (BP Location: Right Arm) Comment: MD aware. Order recieved to discontinue vital sign order.  Pulse 88   Temp 97.7 F (36.5 C) (Oral)   Resp (!) 22   Ht 5\' 10"  (1.778 m)   Wt 79.5 kg (175 lb 4.3 oz)   SpO2 91%   BMI 25.15 kg/m  SpO2: SpO2:  (comfort care) O2 Device: O2 Device: High Flow Nasal Cannula O2 Flow Rate: O2 Flow Rate (L/min): 30  L/min  Intake/output summary:  Intake/Output Summary (Last 24 hours) at 07/17/16 2135 Last data filed at 07/17/16 1600  Gross per 24 hour  Intake              470 ml  Output             1100 ml  Net             -630 ml   LBM: Last BM Date: 07/15/16 Baseline Weight: Weight: 75.4 kg (166 lb 3.6 oz) Most recent weight: Weight: 79.5  kg (175 lb 4.3 oz)       Palliative Assessment/Data:  30%     Patient Active Problem List   Diagnosis Date Noted  . Acute on chronic respiratory failure with hypoxia ([place])   . IPF (idiopathic pulmonary fibrosis) ([place])   . CAP (community acquired pneumonia) 06/29/2016  . Dizziness and giddiness 05/19/2016  . Cervicogenic headache 05/19/2016  . Dyspnea 05/16/2016  . Cough 01/21/2016  . ILD (interstitial lung disease) ([place]) 12/16/2015  . Restrictive lung disease 12/16/2015  . Depression 12/16/2015  . Arthritis 12/16/2015  . Lung nodule < 6cm on CT 11/06/2015  . AAA (abdominal aortic aneurysm) without rupture ([place]) 10/23/2013  . Memory deficits 09/23/2013  . Aftercare following surgery of the circulatory system, [place] 05/01/2013  . Numbness and tingling of right leg 10/03/2012  . DYSLIPIDEMIA 07/15/2009  . CAROTID STENOSIS 07/15/2009  . PVD 07/15/2009  . HYPOTENSION 07/15/2009  . DEGENERATIVE JOINT DISEASE 07/15/2009  . CHEST PAIN 07/15/2009  . Autoimmune hepatitis ([place]) 02/23/2009  . CAD (coronary artery disease) 02/11/2009  . HEMORRHOIDS 02/11/2009  . TRANSAMINASES, SERUM, ELEVATED 02/11/2009  . HYPERTENSION NEC 02/11/2009  . AAA 02/10/2009  . FATTY LIVER DISEASE 02/10/2009    Palliative Care Assessment & Plan    Recommendations/Plan:  Dyspnea: Reports well controlled on intermittent morphine.  Continue same.  Goal moving forward is comfort.  No more BiPap.    Goals of Care and Additional Recommendations:  Limitations on Scope of Treatment: Full Comfort Care  Code Status:    Code Status Orders        Start     Ordered    07/15/16 0950  Do not attempt resuscitation (DNR)  Continuous     07/15/16 0949    Code Status History    Date Active Date Inactive Code Status Order ID Comments User Context   07/06/2016  5:56 PM 07/15/2016  9:49 AM Full Code [id number]  Mariel Aloe, MD Inpatient       Prognosis:   < 2 weeks  Discharge Planning:  Anticipated Hospital Death  Care plan was discussed with patinet, wife, RN  Thank you for allowing the Palliative Medicine Team to assist in the care of this patient.   Time In: 1230 Time Out: 1250 Total Time 20 Prolonged Time Billed No      Greater than 50%  of this time was spent counseling and coordinating care related to the above assessment and plan.  Micheline Rough, MD  Please contact Palliative Medicine Team phone at [phone] for questions and concerns.

## 2016-07-17 NOTE — Progress Notes (Signed)
PROGRESS NOTE    Cesar [name]  [id number] DOB: Mar 31, 1936 DOA: 06/26/2016  PCP: Precious Reel, MD   Brief Narrative:  Cesar [name] is a 80 y.o. male with medical history significant of interstitial lung disease, abdominal aortic aneurysm status post repair, autoimmune hepatitis, carotid stenosis, CAD, hypertension. He has complaints of cough and dyspnea. Outpatient xray showed RML pneumonia. He was noted to by hypoxic and required 4 L in the ER, patient admitted for CAP treatment, continues to have worsening hypoxic respiratory failure during hospital stay, currently progressed to high flow nasal cannula alternating with BiPAP, pulmonary following for respiratory failure, palliative medicine consulted on 9/30, they had a family meeting, decision has been made to focus on patient's comfort  Subjective: No significant events overnight, denies chest pain, or abdominal pain, complains of headache, otherwise uneventful night.   Assessment & Plan:   acute hypoxic respiratory failure - Patient with significant deterioration of respiratory status, his baseline on room air, requiring oxygen since admission, currently on high flow nasal cannula 100% high flow 35%. - Severe CAP versus ILD flare - CTA chest negative for PE - Negative consult appreciated, current focus is on patient comfort  Severe CAP (community acquired pneumonia) - Possibly with resistant organism, versus viral - Initially on IV Fortaz , azithromycin and Tamiflu, currently off antibiotics   ILD - Very likely rapidly progressive IPF flare, continue with high-dose steroids empirically - CT chest showing significantly worsened from the prior study, concerning for worsening chronic interstitial lung disease and fibrosis.  CAD (coronary artery disease) -  denies any chest pain, continue with aspirin  - With elevated troponins, but this is most likely the setting of demand ischemia in the setting of acute respiratory  failure, non-ACS pattern 0.35>0.37>.0.28, 2-D echo with no evidence of regional wall motion abnormalities  Autoimmune hepatitis - Avoid hepatotoxic medication  Cognitive impairment -Continue Aricept and Namenda  Headache - Family report patient has been having intermittent headache since January, has been started on gabapentin which has been helping, we'll resume on gabapentin 300 mg oral twice a day.  Goals of care: Palliative medicine consult appreciated, they had a family meeting, at this point focus is on patient comfort  DVT prophylaxis: comfort  Code Status: DO NOT RESUSCITATE Family Communication: D/W with multilpe family members at bedside at bedside  Disposition Plan: remains in stepdown Consultants:   PCCM  Palliative Procedures:    Antimicrobials:  Anti-infectives    Start     Dose/Rate Route Frequency Ordered Stop   07/13/16 2300  oseltamivir (TAMIFLU) capsule 75 mg  Status:  Discontinued     75 mg Oral 2 times daily 07/13/16 2258 07/15/16 2101   07/13/16 1800  cefTAZidime (FORTAZ) 2 g in dextrose 5 % 50 mL IVPB  Status:  Discontinued     2 g 100 mL/hr over 30 Minutes Intravenous Every 8 hours 07/13/16 1609 07/16/16 1927   07/12/16 1600  azithromycin (ZITHROMAX) tablet 500 mg  Status:  Discontinued     500 mg Oral Every 24 hours 07/03/2016 1756 07/16/16 1927   07/12/16 1000  cefTRIAXone (ROCEPHIN) 1 g in dextrose 5 % 50 mL IVPB  Status:  Discontinued     1 g 100 mL/hr over 30 Minutes Intravenous Every 24 hours 06/25/2016 1756 07/13/16 1556   06/29/2016 1345  cefTRIAXone (ROCEPHIN) 1 g in dextrose 5 % 50 mL IVPB     1 g 100 mL/hr over 30 Minutes Intravenous  Once 07/14/2016 1338 07/05/2016  1511   06/30/2016 1345  azithromycin (ZITHROMAX) 500 mg in dextrose 5 % 250 mL IVPB     500 mg 250 mL/hr over 60 Minutes Intravenous  Once 07/03/2016 1338 06/28/2016 1646       Objective: Vitals:   07/17/16 0300 07/17/16 0400 07/17/16 0500 07/17/16 0600  BP:      Pulse:      Resp:  (!) 21 18 16 17   Temp:      TempSrc:      SpO2:      Weight:      Height:        Intake/Output Summary (Last 24 hours) at 07/17/16 1007 Last data filed at 07/17/16 0600  Gross per 24 hour  Intake              320 ml  Output             1050 ml  Net             -730 ml   Filed Weights   07/13/16 0445 07/13/16 2150  Weight: 75.4 kg (166 lb 3.6 oz) 79.5 kg (175 lb 4.3 oz)    Examination: General exam: Appears comfortable  on High flow nasal cannula HEENT: PERRLA, oral mucosa moist, no sclera icterus or thrush Respiratory system: bilateral basilar crackles, no use of accessory muscle Cardiovascular system: S1 & S2 heard, RRR.  No murmurs  Gastrointestinal system: Abdomen soft, non-tender, nondistended. Normal bowel sound. No organomegaly Central nervous system: Alert and oriented. No focal neurological deficits. Extremities: No cyanosis, clubbing or edema Skin: No rashes or ulcers Psychiatry:  Mood & affect appropriate.     Data Reviewed: I have personally reviewed following labs and imaging studies  CBC:  Recent Labs Lab 07/12/2016 1423 07/12/16 0436 07/14/16 0349 07/15/16 0413  WBC 15.1* 11.0* 22.7* 21.0*  NEUTROABS 13.2* 8.4*  --   --   HGB 10.6* 9.5* 9.7* 9.4*  HCT 31.5* 29.0* 28.6* 28.0*  MCV 89.5 88.7 87.5 88.6  PLT 459* 381 445* 123456*   Basic Metabolic Panel:  Recent Labs Lab 07/03/2016 1423 07/12/16 0436 07/14/16 0349 07/15/16 0413 07/16/16 0350  NA 133* 136 136 137  --   K 4.8 3.9 3.6 4.5  --   CL 104 109 107 106  --   CO2 20* 22 21* 25  --   GLUCOSE 117* 102* 137* 173*  --   BUN 33* 24* 14 27*  --   CREATININE 1.23 0.98 1.08 1.02  --   CALCIUM 9.1 8.5* 8.7* 8.9  --   MG  --   --  2.1 2.4 2.6*  PHOS  --   --  2.8 2.7 2.7   GFR: Estimated Creatinine Clearance: 61.6 mL/min (by C-G formula based on SCr of 1.02 mg/dL). Liver Function Tests:  Recent Labs Lab 07/13/2016 1423 07/12/16 0436  AST 60* 39  ALT 23 19  ALKPHOS 55 48  BILITOT 0.9 0.5    PROT 7.7 6.3*  ALBUMIN 2.9* 2.3*   No results for input(s): LIPASE, AMYLASE in the last 168 hours. No results for input(s): AMMONIA in the last 168 hours. Coagulation Profile:  Recent Labs Lab 07/14/16 0005  INR 1.37   Cardiac Enzymes:  Recent Labs Lab 07/14/16 0005 07/14/16 0744 07/14/16 1533  TROPONINI 0.35* 0.37* 0.28*   BNP (last 3 results)  Recent Labs  11/16/15 1020  PROBNP 354.5   HbA1C: No results for input(s): HGBA1C in the last 72 hours. CBG:  Recent Labs Lab 07/15/16 1658 07/15/16 2206 07/16/16 0821 07/16/16 1212 07/16/16 1657  GLUCAP 156* 195* 170* 162* 210*   Lipid Profile: No results for input(s): CHOL, HDL, LDLCALC, TRIG, CHOLHDL, LDLDIRECT in the last 72 hours. Thyroid Function Tests: No results for input(s): TSH, T4TOTAL, FREET4, T3FREE, THYROIDAB in the last 72 hours. Anemia Panel: No results for input(s): VITAMINB12, FOLATE, FERRITIN, TIBC, IRON, RETICCTPCT in the last 72 hours. Urine analysis:    Component Value Date/Time   COLORURINE YELLOW 08/30/2010 0956   APPEARANCEUR CLEAR 08/30/2010 0956   LABSPEC 1.022 08/30/2010 0956   PHURINE 6.0 08/30/2010 0956   GLUCOSEU NEGATIVE 08/30/2010 0956   HGBUR NEGATIVE 08/30/2010 0956   BILIRUBINUR NEGATIVE 08/30/2010 0956   KETONESUR NEGATIVE 08/30/2010 0956   PROTEINUR NEGATIVE 08/30/2010 0956   UROBILINOGEN 0.2 08/30/2010 0956   NITRITE NEGATIVE 08/30/2010 0956   LEUKOCYTESUR  08/30/2010 0956    NEGATIVE MICROSCOPIC NOT DONE ON URINES WITH NEGATIVE PROTEIN, BLOOD, LEUKOCYTES, NITRITE, OR GLUCOSE <1000 mg/dL.   Sepsis Labs: @LABRCNTIP (procalcitonin:4,lacticidven:4) ) Recent Results (from the past 240 hour(s))  Culture, blood (routine x 2)     Status: None   Collection Time: 06/17/2016  2:32 PM  Result Value Ref Range Status   Specimen Description BLOOD LEFT HAND  Final   Special Requests BOTTLES DRAWN AEROBIC AND ANAEROBIC 5CC  Final   Culture   Final    NO GROWTH 5 DAYS Performed at  [facility]    Report Status 07/16/2016 FINAL  Final  Culture, blood (routine x 2)     Status: None   Collection Time: 06/24/2016  2:32 PM  Result Value Ref Range Status   Specimen Description BLOOD RIGHT ARM  Final   Special Requests BOTTLES DRAWN AEROBIC AND ANAEROBIC 5ML  Final   Culture   Final    NO GROWTH 5 DAYS Performed at [facility]    Report Status 07/16/2016 FINAL  Final  C difficile quick scan w PCR reflex     Status: None   Collection Time: 07/12/16  6:13 AM  Result Value Ref Range Status   C Diff antigen NEGATIVE NEGATIVE Final   C Diff toxin NEGATIVE NEGATIVE Final   C Diff interpretation No C. difficile detected.  Final  Respiratory Panel by PCR     Status: None   Collection Time: 07/13/16 11:17 PM  Result Value Ref Range Status   Adenovirus NOT DETECTED NOT DETECTED Final   Coronavirus 229E NOT DETECTED NOT DETECTED Final   Coronavirus HKU1 NOT DETECTED NOT DETECTED Final   Coronavirus NL63 NOT DETECTED NOT DETECTED Final   Coronavirus OC43 NOT DETECTED NOT DETECTED Final   Metapneumovirus NOT DETECTED NOT DETECTED Final   Rhinovirus / Enterovirus NOT DETECTED NOT DETECTED Final   Influenza A NOT DETECTED NOT DETECTED Final   Influenza B NOT DETECTED NOT DETECTED Final   Parainfluenza Virus 1 NOT DETECTED NOT DETECTED Final   Parainfluenza Virus 2 NOT DETECTED NOT DETECTED Final   Parainfluenza Virus 3 NOT DETECTED NOT DETECTED Final   Parainfluenza Virus 4 NOT DETECTED NOT DETECTED Final   Respiratory Syncytial Virus NOT DETECTED NOT DETECTED Final   Bordetella pertussis NOT DETECTED NOT DETECTED Final   Chlamydophila pneumoniae NOT DETECTED NOT DETECTED Final   Mycoplasma pneumoniae NOT DETECTED NOT DETECTED Final    Comment: Performed at [facility]  MRSA PCR Screening     Status: None   Collection Time: 07/13/16 11:17 PM  Result Value Ref  Range Status   MRSA by PCR NEGATIVE NEGATIVE Final    Comment:        The GeneXpert MRSA  Assay (FDA approved for NASAL specimens only), is one component of a comprehensive MRSA colonization surveillance program. It is not intended to diagnose MRSA infection nor to guide or monitor treatment for MRSA infections.   Culture, sputum-assessment     Status: None   Collection Time: 07/15/16  9:20 AM  Result Value Ref Range Status   Specimen Description SPUTUM  Final   Special Requests Normal  Final   Sputum evaluation   Final    THIS SPECIMEN IS ACCEPTABLE. RESPIRATORY CULTURE REPORT TO FOLLOW.   Report Status 07/15/2016 FINAL  Final  Culture, respiratory (NON-Expectorated)     Status: None (Preliminary result)   Collection Time: 07/15/16  9:20 AM  Result Value Ref Range Status   Specimen Description SPUTUM  Final   Special Requests NONE  Final   Gram Stain   Final    MODERATE WBC PRESENT, PREDOMINANTLY PMN MODERATE YEAST HYPHAL ELEMENTS SEEN MODERATE GRAM POSITIVE COCCI IN CLUSTERS IN TETRADS RARE GRAM NEGATIVE COCCOBACILLI RARE GRAM NEGATIVE DIPLOCOCCI Performed at [facility]    Culture FEW YEAST  Final   Report Status PENDING  Incomplete         Radiology Studies: Dg Chest Port 1 View  Result Date: 07/16/2016 CLINICAL DATA:  Acute lung injury. EXAM: PORTABLE CHEST 1 VIEW COMPARISON:  July 15, 2016 FINDINGS: There is opacity diffusely throughout the right lung, most prominent in the right base. Opacity throughout the left mid lower lung is unchanged. There is sparing of the left apex. No pneumothorax. Stable cardiomediastinal silhouette. No other interval changes. IMPRESSION: Stable bilateral pulmonary opacities, right greater than left. Electronically Signed   By: Dorise Bullion III M.D   On: 07/16/2016 07:23      Scheduled Meds: . feeding supplement (ENSURE ENLIVE)  237 mL Oral BID BM  . gabapentin  300 mg Oral BID  . ipratropium-albuterol  3 mL Nebulization TID  . mouth rinse  15 mL Mouth Rinse BID  . methylPREDNISolone (SOLU-MEDROL)  injection  125 mg Intravenous Q6H   Continuous Infusions:     LOS: 5 days    ELGERGAWY, DAWOOD, MD [facility] [email or web address] Password [email or web address] 07/17/2016, 10:07 AM

## 2016-07-17 NOTE — Progress Notes (Signed)
Name: Cesar [name] MRN: [id number] DOB: Apr 30, 1936    ADMISSION DATE:  07/01/2016 CONSULTATION DATE:  9/27  REFERRING MD :  Elgergawy   CHIEF COMPLAINT:  Pneumonia not responding to typical therapy   BRIEF PATIENT DESCRIPTION:  This is a 80 year old male f/b Jessee Mezera for presumptive  ILD/atypical UIP pattern as well RUL nodule, both which which have been stable. Admitted on 9/25 w/ working dx of CAP. PCCM asked to see on 9/27 when his CXR had worsened and was still requiring higher levels of FIO2.   SIGNIFICANT EVENTS  9/28 moved to ICU CT worse  STUDIES: CTA Chest 9/28: 1. No evidence of pulmonary embolus.2. Extensive bilateral honeycombing, with diffuse fibrotic change and interstitial prominence, significantly worsened from the prior study, concerning for worsening chronic interstitial lung disease and fibrosis.3. Large areas of crazy paving opacification in both lungs. This raises concern for superimposed ARDS or pneumonia, corresponding to the patient's acute symptoms. 4. Diffuse coronary artery calcifications seen. Mild biatrial enlargement noted. 5. Mild luminal narrowing suggested at the proximal left subclavian artery. 6. Right hilar nodes measure up to 1.7 cm in short axis, and a 1.4 cm azygoesophageal recess node is seen. This may reflect underlying infection.  MICROBIOLOGY:  Blood Ctx x2 9/25:  Negative  MRSA PCR 9/27:  Negative RVP 9/27:  Negative. Sputum Ctx 9/29 >>  SUBJECTIVE: Patient transitioned to comfort overnight as he did not wish to go back on BiPAP support. Currently receiving morphine IV intermittently. Patient reports dyspnea is somewhat improved today. Continuing to have intermittent coughing spells. Denies any chest tightness or pressure.  REVIEW OF SYSTEMS: Unobtainable to obtain with dementia/memory problems.  VITAL SIGNS: Temp:  [97.7 F (36.5 C)] 97.7 F (36.5 C) (09/30 1200) Pulse Rate:  [60-98] 88 (09/30 1800) Resp:  [16-28] 17 (10/01  0600) BP: (127-176)/(60-93) 176/93 (09/30 2000) SpO2:  [87 %-96 %] 91 % (09/30 1800) FiO2 (%):  [35 %-100 %] 100 % (10/01 0900)  PHYSICAL EXAMINATION: General:  No distress and appears calm. Wife at bedside. Awake. Neuro:  Moving all 4 extremities equally. Grossly nonfocal. No meningismus. HEENT:  No scleral icterus or injection. Moist mucous membranes. Cardiovascular:  Regular rate. Regular rhythm. No edema. Lungs:  Basilar Velcro crackles unchanged. Normal work of breathing on high flow nasal cannula oxygen. No accessory muscle use. Abdomen:  Soft. Nontender. Normal bowel sounds. Musculoskeletal:  Normal bulk and tone. No joint deformity or effusion appreciated. Skin:  Warm and dry. No rash on exposed skin.    Recent Labs Lab 07/12/16 0436 07/14/16 0349 07/15/16 0413  NA 136 136 137  K 3.9 3.6 4.5  CL 109 107 106  CO2 22 21* 25  BUN 24* 14 27*  CREATININE 0.98 1.08 1.02  GLUCOSE 102* 137* 173*    Recent Labs Lab 07/12/16 0436 07/14/16 0349 07/15/16 0413  HGB 9.5* 9.7* 9.4*  HCT 29.0* 28.6* 28.0*  WBC 11.0* 22.7* 21.0*  PLT 381 445* 461*   Dg Chest Port 1 View  Result Date: 07/16/2016 CLINICAL DATA:  Acute lung injury. EXAM: PORTABLE CHEST 1 VIEW COMPARISON:  July 15, 2016 FINDINGS: There is opacity diffusely throughout the right lung, most prominent in the right base. Opacity throughout the left mid lower lung is unchanged. There is sparing of the left apex. No pneumothorax. Stable cardiomediastinal silhouette. No other interval changes. IMPRESSION: Stable bilateral pulmonary opacities, right greater than left. Electronically Signed   By: Dorise Bullion III M.D   On:  07/16/2016 07:23   ASSESSMENT/PLAN:  80 y.o. male with underlying ILD and acute on chronic hypoxic respiratory failure. Patient has had minimal improvement. Transitioned to comfort care at this time. Patient seems to be responding quite well to intermittent IV morphine. Awaiting on arrival of further  family. Suspect patient's headache is due to the high flow of nasal cannula oxygen. Goals at this time are for patient comfort therefore I'm deferring on further IV Lasix.  1. Acute on Chronic Hypoxic Respiratory Failure:  Focusing on patient comfort. Continuing high flow nasal cannula oxygen. Continuing IV morphine as needed. Continuing Duonebs 3 times a day. 2. Severe CAP vs ILD Flare:  Awaiting Sputum culture result. No further antibiotics at this time. Continuing Solu-Medrol IV every 6 hours. 3. Prognosis: Palliative medicine following. Focusing on patient comfort at this time.   Sonia Baller Ashok Cordia, M.D. [facility] Pager:  [phone] After 3pm or if no response, call [phone] 07/17/2016, 9:24 AM

## 2016-07-17 DEATH — deceased

## 2016-07-18 ENCOUNTER — Telehealth: Payer: Self-pay | Admitting: Internal Medicine

## 2016-07-18 ENCOUNTER — Telehealth: Payer: Self-pay | Admitting: Neurology

## 2016-07-18 DIAGNOSIS — Z515 Encounter for palliative care: Secondary | ICD-10-CM

## 2016-07-18 DIAGNOSIS — Z66 Do not resuscitate: Secondary | ICD-10-CM

## 2016-07-18 LAB — CULTURE, RESPIRATORY W GRAM STAIN

## 2016-07-18 LAB — CULTURE, RESPIRATORY

## 2016-07-18 MED ORDER — MORPHINE BOLUS VIA INFUSION
2.0000 mg | INTRAVENOUS | Status: DC | PRN
  Administered 2016-07-18 (×2): 2 mg via INTRAVENOUS
  Filled 2016-07-18: qty 2

## 2016-07-18 MED ORDER — SODIUM CHLORIDE 0.9 % IV SOLN
1.0000 mg/h | INTRAVENOUS | Status: DC
  Administered 2016-07-18: 1 mg/h via INTRAVENOUS
  Filled 2016-07-18: qty 10

## 2016-07-18 MED ORDER — PANTOPRAZOLE SODIUM 40 MG PO TBEC: 40.0000 mg | DELAYED_RELEASE_TABLET | Freq: Every day | ORAL | Status: DC

## 2016-08-02 ENCOUNTER — Ambulatory Visit: Payer: Self-pay | Admitting: Cardiology

## 2016-08-16 ENCOUNTER — Encounter: Payer: Self-pay | Admitting: Internal Medicine

## 2016-08-17 NOTE — Telephone Encounter (Signed)
Pt's wife called to advise he is in the hospital with pneumonia and pulmonary fibrosis, she said he is gravely ill. She wanted to thank Dr Viona Gilmore for all his help and kindness.

## 2016-08-17 NOTE — Discharge Summary (Signed)
Brief Death Summary:  Discharge diagnosis:  - acute hypoxic respiratory failure - Severe CAP (community acquired pneumonia) - ILD Flare - CAD (coronary artery disease) - History of Autoimmune hepatitis - Cognitive impairment  Hospital course Please see progress note dictated by me on 12-Aug-2016 for more details Cesar Leonhart Reynoldsis a 80 y.o.malewith medical history significant of interstitial lung disease, abdominal aortic aneurysm status post repair, autoimmune hepatitis, carotid stenosis, CAD, hypertension. He has complaints of cough and dyspnea. Outpatient xray showed RML pneumonia. He was noted to by hypoxic and required 4 L in the ER, patient admitted for CAP treatment, continues to have worsening hypoxic respiratory failure during hospital stay, and worsening lung finding on imaging, transferred to step down giving significant hypoxia tachypnea and respiratory distress, required BiPAP and high flow nasal cannula , and was seen by pulmonary service, it was felt his having ILD flare, and CAP as well , patient has been seen by palliative medicine, pulmonary, multiple discussions has been carried regarding goals of care , patient was transitioned to comfort care, was kept in stepdown secondary to high flow oxygen demand , and Integrilin IV Solu-Medrol as well. - Patient passed away on 2016/08/12, at 19:38, family were at bedside. Phillips Climes MD

## 2016-08-17 NOTE — Progress Notes (Signed)
Daily Progress Note   Patient Name: Cesar [name]       Date: 08-10-16 DOB: Jan 18, 1936  Age: 80 y.o. MRN#: [id number] Attending Physician: Albertine Patricia, MD Primary Care Physician: Precious Reel, MD Admit Date: 07/10/2016  Reason for Consultation/Follow-up: Establishing goals of care  Subjective: Patient with significant change from exam yesterday.  Has been having brief periods of wakefulness, but does not arouse during encounter.  Family reports that he has been more uncomfortable when he is awake.  His PRN usage has increased and Marni Griffon discussed continuous infusion with family earlier today.  They are asking if now is appropriate time to start this and I agree that he would best be served by continuous infusion.    Family denies other needs at this time.  Length of Stay: 6  Current Medications: Scheduled Meds:  . feeding supplement (ENSURE ENLIVE)  237 mL Oral BID BM  . gabapentin  300 mg Oral BID  . mouth rinse  15 mL Mouth Rinse BID  . pantoprazole  40 mg Oral Daily    Continuous Infusions:    PRN Meds: acetaminophen, albuterol, benzonatate, diphenoxylate-atropine, glycopyrrolate, haloperidol lactate, LORazepam, morphine injection, ondansetron (ZOFRAN) IV  Physical Exam      General: Alert, awake, in no acute distress. High flow Baker in place. HEENT: No bruits, no goiter, no JVD Heart: Regular rate. No murmur appreciated. Lungs: Fair air movement, Bibasilar crackles Abdomen: Soft, nontender, nondistended, positive bowel sounds.  Ext: No significant edema Skin: Warm and dry Neuro: Grossly intact, nonfocal.     Vital Signs: BP (!) 176/93 (BP Location: Right Arm) Comment: MD aware. Order recieved to discontinue vital sign order.  Pulse 62   Temp 97.7 F  (36.5 C) (Oral)   Resp (!) 27   Ht 5\' 10"  (1.778 m)   Wt 79.5 kg (175 lb 4.3 oz)   SpO2 91%   BMI 25.15 kg/m  SpO2: SpO2:  (not obtained - comfort care) O2 Device: O2 Device: High Flow Nasal Cannula O2 Flow Rate: O2 Flow Rate (L/min): 25 L/min  Intake/output summary:   Intake/Output Summary (Last 24 hours) at 08/10/16 1547 Last data filed at August 10, 2016 1500  Gross per 24 hour  Intake                0 ml  Output             1700 ml  Net            -1700 ml   LBM: Last BM Date: 07/15/16 Baseline Weight: Weight: 75.4 kg (166 lb 3.6 oz) Most recent weight: Weight: 79.5 kg (175 lb 4.3 oz)       Palliative Assessment/Data:  30%     Patient Active Problem List   Diagnosis Date Noted  . DNAR (do not attempt resuscitation) 2016/08/02  . Terminal care 02-Aug-2016  . Palliative care encounter   . Acute on chronic respiratory failure with hypoxia ([place])   . IPF (idiopathic pulmonary fibrosis) ([place])   . CAP (community acquired pneumonia) 06/29/2016  . Dizziness and giddiness 05/19/2016  . Cervicogenic headache 05/19/2016  . SOB (shortness of breath) 05/16/2016  . Cough 01/21/2016  . ILD (interstitial lung disease) ([place]) 12/16/2015  . Restrictive lung disease 12/16/2015  . Depression 12/16/2015  . Arthritis 12/16/2015  . Lung nodule < 6cm on CT 11/06/2015  . AAA (abdominal aortic aneurysm) without rupture ([place]) 10/23/2013  . Memory deficits 09/23/2013  . Aftercare following surgery of the circulatory system, [place] 05/01/2013  . Numbness and tingling of right leg 10/03/2012  . DYSLIPIDEMIA 07/15/2009  . CAROTID STENOSIS 07/15/2009  . PVD 07/15/2009  . HYPOTENSION 07/15/2009  . DEGENERATIVE JOINT DISEASE 07/15/2009  . CHEST PAIN 07/15/2009  . Autoimmune hepatitis ([place]) 02/23/2009  . CAD (coronary artery disease) 02/11/2009  . HEMORRHOIDS 02/11/2009  . TRANSAMINASES, SERUM, ELEVATED 02/11/2009  . HYPERTENSION NEC 02/11/2009  . AAA 02/10/2009  . FATTY LIVER DISEASE  02/10/2009    Palliative Care Assessment & Plan    Recommendations/Plan:   Dyspnea: He has been getting more uncomfortable and requiring more frequent PRN doses.  Plan for initiation of continuous infusion of morphine 1mg /hr.  Continue additional 2mg  every 15 min PRN.  Goal moving forward is comfort.  No more BiPap.    Goals of Care and Additional Recommendations:  Limitations on Scope of Treatment: Full Comfort Care  Code Status:    Code Status Orders        Start     Ordered   07/15/16 0950  Do not attempt resuscitation (DNR)  Continuous     07/15/16 0949    Code Status History    Date Active Date Inactive Code Status Order ID Comments User Context   07/08/2016  5:56 PM 07/15/2016  9:49 AM Full Code [id number]  Mariel Aloe, MD Inpatient       Prognosis:   Hours - Days  Discharge Planning:  He will have a hospital death  Care plan was discussed with patinet, wife, RN  Thank you for allowing the Palliative Medicine Team to assist in the care of this patient.   Time In: 1530 Time Out: 1600 Total Time 30 Prolonged Time Billed No      Greater than 50%  of this time was spent counseling and coordinating care related to the above assessment and plan.  Micheline Rough, MD  Please contact Palliative Medicine Team phone at [phone] for questions and concerns.

## 2016-08-17 NOTE — Progress Notes (Signed)
Date:  2016/07/19 Chart reviewed for concurrent status and case management needs. Will continue to follow the patient for status change: hfnc/nebs and iv solu-medrol Discharge Planning: following for needs Expected discharge date: [id number] Velva Harman, BSN, [place], [place]

## 2016-08-17 NOTE — Progress Notes (Signed)
Time of death 19:38. Absence of heart and lung sounds confirmed by this RN and Tarri Abernethy, RN. Family at bedside.

## 2016-08-17 NOTE — Progress Notes (Signed)
Patient is ready to be picked up by [facility]. Family aware and was in room at time of death. Bed placement notified and funeral home will be here in approximately an hour.

## 2016-08-17 NOTE — Telephone Encounter (Signed)
Events noted

## 2016-08-17 NOTE — Telephone Encounter (Signed)
Thank you :)

## 2016-08-17 NOTE — Progress Notes (Signed)
PROGRESS NOTE    Cesar [name]  [id number] DOB: 09-06-1936 DOA: 06/30/2016  PCP: Precious Reel, MD   Brief Narrative:  Cesar [name] is a 80 y.o. male with medical history significant of interstitial lung disease, abdominal aortic aneurysm status post repair, autoimmune hepatitis, carotid stenosis, CAD, hypertension. He has complaints of cough and dyspnea. Outpatient xray showed RML pneumonia. He was noted to by hypoxic and required 4 L in the ER, patient admitted for CAP treatment, continues to have worsening hypoxic respiratory failure during hospital stay, currently progressed to high flow nasal cannula alternating with BiPAP, pulmonary following for respiratory failure, palliative medicine consulted on 9/30, they had a family meeting, decision has been made to focus on patient's comfort  Subjective: No significant events overnight,No chest pain, or abdominal pain, has couple episodes of significant cough spells.   Assessment & Plan:   acute hypoxic respiratory failure - Patient with significant deterioration of respiratory status, his baseline on room air, requiring oxygen since admission, currently on high flow nasal cannula 100% high flow 35%. - Severe CAP versus ILD flare - CTA chest negative for PE - palliative consult appreciated, current focus is on patient comfort, no further BiPAP, wean high flow nasal cannula as tolerated.  Severe CAP (community acquired pneumonia) - Possibly with resistant organism, versus viral - Initially on IV Fortaz , azithromycin and Tamiflu, currently off antibiotics  ILD - Very likely rapidly progressive IPF flare, on  high-dose steroids empirically - CT chest showing significantly worsened from the prior study, concerning for worsening chronic interstitial lung disease and fibrosis.  CAD (coronary artery disease) -  denies any chest pain, continue with aspirin  - With elevated troponins, but this is most likely the setting of demand  ischemia in the setting of acute respiratory failure, non-ACS pattern 0.35>0.37>.0.28, 2-D echo with no evidence of regional wall motion abnormalities  Autoimmune hepatitis - Avoid hepatotoxic medication  Cognitive impairment -Continue Aricept and Namenda  Headache - Family report patient has been having intermittent headache since January, has been started on gabapentin which has been helping,resumed on gabapentin 300 mg oral twice a day.  Goals of care: Palliative medicine consult appreciated, they had a family meeting, at this point focus is on patient comfort  DVT prophylaxis: comfort  Code Status: DO NOT RESUSCITATE Family Communication: D/W with multilpe family members at bedside at bedside  Disposition Plan: remains in stepdown Consultants:   PCCM  Palliative Procedures:    Antimicrobials:  Anti-infectives    Start     Dose/Rate Route Frequency Ordered Stop   07/13/16 2300  oseltamivir (TAMIFLU) capsule 75 mg  Status:  Discontinued     75 mg Oral 2 times daily 07/13/16 2258 07/15/16 2101   07/13/16 1800  cefTAZidime (FORTAZ) 2 g in dextrose 5 % 50 mL IVPB  Status:  Discontinued     2 g 100 mL/hr over 30 Minutes Intravenous Every 8 hours 07/13/16 1609 07/16/16 1927   07/12/16 1600  azithromycin (ZITHROMAX) tablet 500 mg  Status:  Discontinued     500 mg Oral Every 24 hours 07/03/2016 1756 07/16/16 1927   07/12/16 1000  cefTRIAXone (ROCEPHIN) 1 g in dextrose 5 % 50 mL IVPB  Status:  Discontinued     1 g 100 mL/hr over 30 Minutes Intravenous Every 24 hours 07/06/2016 1756 07/13/16 1556   07/09/2016 1345  cefTRIAXone (ROCEPHIN) 1 g in dextrose 5 % 50 mL IVPB     1 g 100 mL/hr over 30  Minutes Intravenous  Once 07/10/2016 1338 06/18/2016 1511   07/01/2016 1345  azithromycin (ZITHROMAX) 500 mg in dextrose 5 % 250 mL IVPB     500 mg 250 mL/hr over 60 Minutes Intravenous  Once 07/15/2016 1338 07/14/2016 1646       Objective: Vitals:   Aug 13, 2016 0800 08/13/2016 0840 08/13/16 0900 Aug 13, 2016  1000  BP:      Pulse:  62    Resp: 18 (!) 29 (!) 28 (!) 25  Temp:      TempSrc:      Weight:      Height:        Intake/Output Summary (Last 24 hours) at 08/13/2016 1015 Last data filed at 13-Aug-2016 0555  Gross per 24 hour  Intake              240 ml  Output             2000 ml  Net            -1760 ml   Filed Weights   07/13/16 0445 07/13/16 2150  Weight: 75.4 kg (166 lb 3.6 oz) 79.5 kg (175 lb 4.3 oz)    Examination: General exam: Appears comfortable  on High flow nasal cannula HEENT: PERRLA, oral mucosa moist, no sclera icterus or thrush Respiratory system: bilateral basilar crackles, no use of accessory muscle Cardiovascular system: S1 & S2 heard, RRR.  No murmurs  Gastrointestinal system: Abdomen soft, non-tender, nondistended. Normal bowel sound. No organomegaly Central nervous system: Alert and oriented. No focal neurological deficits. Extremities: No cyanosis, clubbing or edema Skin: No rashes or ulcers Psychiatry:  Mood & affect appropriate.     Data Reviewed: I have personally reviewed following labs and imaging studies  CBC:  Recent Labs Lab 06/25/2016 1423 07/12/16 0436 07/14/16 0349 07/15/16 0413  WBC 15.1* 11.0* 22.7* 21.0*  NEUTROABS 13.2* 8.4*  --   --   HGB 10.6* 9.5* 9.7* 9.4*  HCT 31.5* 29.0* 28.6* 28.0*  MCV 89.5 88.7 87.5 88.6  PLT 459* 381 445* 123456*   Basic Metabolic Panel:  Recent Labs Lab 07/05/2016 1423 07/12/16 0436 07/14/16 0349 07/15/16 0413 07/16/16 0350  NA 133* 136 136 137  --   K 4.8 3.9 3.6 4.5  --   CL 104 109 107 106  --   CO2 20* 22 21* 25  --   GLUCOSE 117* 102* 137* 173*  --   BUN 33* 24* 14 27*  --   CREATININE 1.23 0.98 1.08 1.02  --   CALCIUM 9.1 8.5* 8.7* 8.9  --   MG  --   --  2.1 2.4 2.6*  PHOS  --   --  2.8 2.7 2.7   GFR: Estimated Creatinine Clearance: 61.6 mL/min (by C-G formula based on SCr of 1.02 mg/dL). Liver Function Tests:  Recent Labs Lab 07/15/2016 1423 07/12/16 0436  AST 60* 39  ALT 23 19    ALKPHOS 55 48  BILITOT 0.9 0.5  PROT 7.7 6.3*  ALBUMIN 2.9* 2.3*   No results for input(s): LIPASE, AMYLASE in the last 168 hours. No results for input(s): AMMONIA in the last 168 hours. Coagulation Profile:  Recent Labs Lab 07/14/16 0005  INR 1.37   Cardiac Enzymes:  Recent Labs Lab 07/14/16 0005 07/14/16 0744 07/14/16 1533  TROPONINI 0.35* 0.37* 0.28*   BNP (last 3 results)  Recent Labs  11/16/15 1020  PROBNP 354.5   HbA1C: No results for input(s): HGBA1C in the last 72 hours.  CBG:  Recent Labs Lab 07/15/16 1658 07/15/16 2206 07/16/16 0821 07/16/16 1212 07/16/16 1657  GLUCAP 156* 195* 170* 162* 210*   Lipid Profile: No results for input(s): CHOL, HDL, LDLCALC, TRIG, CHOLHDL, LDLDIRECT in the last 72 hours. Thyroid Function Tests: No results for input(s): TSH, T4TOTAL, FREET4, T3FREE, THYROIDAB in the last 72 hours. Anemia Panel: No results for input(s): VITAMINB12, FOLATE, FERRITIN, TIBC, IRON, RETICCTPCT in the last 72 hours. Urine analysis:    Component Value Date/Time   COLORURINE YELLOW 08/30/2010 0956   APPEARANCEUR CLEAR 08/30/2010 0956   LABSPEC 1.022 08/30/2010 0956   PHURINE 6.0 08/30/2010 0956   GLUCOSEU NEGATIVE 08/30/2010 0956   HGBUR NEGATIVE 08/30/2010 0956   BILIRUBINUR NEGATIVE 08/30/2010 0956   KETONESUR NEGATIVE 08/30/2010 0956   PROTEINUR NEGATIVE 08/30/2010 0956   UROBILINOGEN 0.2 08/30/2010 0956   NITRITE NEGATIVE 08/30/2010 0956   LEUKOCYTESUR  08/30/2010 0956    NEGATIVE MICROSCOPIC NOT DONE ON URINES WITH NEGATIVE PROTEIN, BLOOD, LEUKOCYTES, NITRITE, OR GLUCOSE <1000 mg/dL.   Sepsis Labs: @LABRCNTIP (procalcitonin:4,lacticidven:4) ) Recent Results (from the past 240 hour(s))  Culture, blood (routine x 2)     Status: None   Collection Time: 06/26/2016  2:32 PM  Result Value Ref Range Status   Specimen Description BLOOD LEFT HAND  Final   Special Requests BOTTLES DRAWN AEROBIC AND ANAEROBIC 5CC  Final   Culture   Final     NO GROWTH 5 DAYS Performed at [facility]    Report Status 07/16/2016 FINAL  Final  Culture, blood (routine x 2)     Status: None   Collection Time: 06/26/2016  2:32 PM  Result Value Ref Range Status   Specimen Description BLOOD RIGHT ARM  Final   Special Requests BOTTLES DRAWN AEROBIC AND ANAEROBIC 5ML  Final   Culture   Final    NO GROWTH 5 DAYS Performed at [facility]    Report Status 07/16/2016 FINAL  Final  C difficile quick scan w PCR reflex     Status: None   Collection Time: 07/12/16  6:13 AM  Result Value Ref Range Status   C Diff antigen NEGATIVE NEGATIVE Final   C Diff toxin NEGATIVE NEGATIVE Final   C Diff interpretation No C. difficile detected.  Final  Respiratory Panel by PCR     Status: None   Collection Time: 07/13/16 11:17 PM  Result Value Ref Range Status   Adenovirus NOT DETECTED NOT DETECTED Final   Coronavirus 229E NOT DETECTED NOT DETECTED Final   Coronavirus HKU1 NOT DETECTED NOT DETECTED Final   Coronavirus NL63 NOT DETECTED NOT DETECTED Final   Coronavirus OC43 NOT DETECTED NOT DETECTED Final   Metapneumovirus NOT DETECTED NOT DETECTED Final   Rhinovirus / Enterovirus NOT DETECTED NOT DETECTED Final   Influenza A NOT DETECTED NOT DETECTED Final   Influenza B NOT DETECTED NOT DETECTED Final   Parainfluenza Virus 1 NOT DETECTED NOT DETECTED Final   Parainfluenza Virus 2 NOT DETECTED NOT DETECTED Final   Parainfluenza Virus 3 NOT DETECTED NOT DETECTED Final   Parainfluenza Virus 4 NOT DETECTED NOT DETECTED Final   Respiratory Syncytial Virus NOT DETECTED NOT DETECTED Final   Bordetella pertussis NOT DETECTED NOT DETECTED Final   Chlamydophila pneumoniae NOT DETECTED NOT DETECTED Final   Mycoplasma pneumoniae NOT DETECTED NOT DETECTED Final    Comment: Performed at [facility]  MRSA PCR Screening     Status: None   Collection Time: 07/13/16 11:17 PM  Result  Value Ref Range Status   MRSA by PCR NEGATIVE NEGATIVE Final     Comment:        The GeneXpert MRSA Assay (FDA approved for NASAL specimens only), is one component of a comprehensive MRSA colonization surveillance program. It is not intended to diagnose MRSA infection nor to guide or monitor treatment for MRSA infections.   Culture, sputum-assessment     Status: None   Collection Time: 07/15/16  9:20 AM  Result Value Ref Range Status   Specimen Description SPUTUM  Final   Special Requests Normal  Final   Sputum evaluation   Final    THIS SPECIMEN IS ACCEPTABLE. RESPIRATORY CULTURE REPORT TO FOLLOW.   Report Status 07/15/2016 FINAL  Final  Culture, respiratory (NON-Expectorated)     Status: None (Preliminary result)   Collection Time: 07/15/16  9:20 AM  Result Value Ref Range Status   Specimen Description SPUTUM  Final   Special Requests NONE  Final   Gram Stain   Final    MODERATE WBC PRESENT, PREDOMINANTLY PMN MODERATE YEAST HYPHAL ELEMENTS SEEN MODERATE GRAM POSITIVE COCCI IN CLUSTERS IN TETRADS RARE GRAM NEGATIVE COCCOBACILLI RARE GRAM NEGATIVE DIPLOCOCCI Performed at [facility]    Culture FEW YEAST  Final   Report Status PENDING  Incomplete         Radiology Studies: No results found.    Scheduled Meds: . feeding supplement (ENSURE ENLIVE)  237 mL Oral BID BM  . gabapentin  300 mg Oral BID  . ipratropium-albuterol  3 mL Nebulization TID  . mouth rinse  15 mL Mouth Rinse BID  . methylPREDNISolone (SOLU-MEDROL) injection  125 mg Intravenous Q6H  . pantoprazole  40 mg Oral Daily   Continuous Infusions:     LOS: 6 days    Blaiden Werth, MD [facility] [id number] [email or web address] Password [email or web address] 07/30/16, 10:15 AM

## 2016-08-17 NOTE — Progress Notes (Signed)
   Name: Cesar [name] MRN: [id number] DOB: 1936/10/15    ADMISSION DATE:  06/29/2016 CONSULTATION DATE:  9/27  REFERRING MD :  Elgergawy   CHIEF COMPLAINT:  Pneumonia not responding to typical therapy    VITAL SIGNS: Pulse Rate:  [62] 62 (10/02 0840) Resp:  [13-31] 25 (10/02 1000) FiO2 (%):  [100 %] 100 % (10/02 0840) 100% NRB PHYSICAL EXAMINATION: General:  No distress and appears calm. Wife at bedside. Awake. Neuro:  Moving all 4 extremities equally. Grossly nonfocal. No meningismus. Sp slurred. Labored w/ any activity HEENT:  No scleral icterus or injection. Moist mucous membranes. Cardiovascular:  Regular rate. Regular rhythm. No edema. Lungs:  Basilar Velcro crackles unchanged. Increased work of breathing on high flow nasal cannula oxygen. No accessory muscle use. Abdomen:  Soft. Nontender. Normal bowel sounds. Musculoskeletal:  Normal bulk and tone. No joint deformity or effusion appreciated. Skin:  Warm and dry. No rash on exposed skin.    Recent Labs Lab 07/12/16 0436 07/14/16 0349 07/15/16 0413  NA 136 136 137  K 3.9 3.6 4.5  CL 109 107 106  CO2 22 21* 25  BUN 24* 14 27*  CREATININE 0.98 1.08 1.02  GLUCOSE 102* 137* 173*    Recent Labs Lab 07/12/16 0436 07/14/16 0349 07/15/16 0413  HGB 9.5* 9.7* 9.4*  HCT 29.0* 28.6* 28.0*  WBC 11.0* 22.7* 21.0*  PLT 381 445* 461*   No results found.    ASSESSMENT/PLAN: Acute on chronic hypoxic respiratory failure ILD flare Progressive ILD    80 y.o. male with underlying ILD and acute on chronic hypoxic respiratory failure d/t Severe CAP vs ILD Flare/progression.  Favoring this being ILD. He has not responded to steroids, abx or diuretics. He appears to be actively dying. I spent time w/ the family and the patient. I do not think he will be able to make it home and suspect that he will die in the hospital . I have encouraged the family to be present as much as possible.  Plan -Cont comfort oriented  care -Agree w/ current morphine dosing-->this could transition to IV gtt if needed -Cont PRN ativan -No change in high-flow -dc steroids Insert foley -full DNR  Erick Colace ACNP-BC [employer] Pager # [phone] OR # [phone] if no answer

## 2016-08-17 NOTE — Telephone Encounter (Signed)
FYI Dr. Henrene Pastor.

## 2016-08-17 DEATH — deceased

## 2016-08-31 ENCOUNTER — Ambulatory Visit: Payer: Medicare Other | Admitting: Pulmonary Disease

## 2016-08-31 ENCOUNTER — Ambulatory Visit: Payer: Medicare Other

## 2016-11-02 ENCOUNTER — Encounter (HOSPITAL_COMMUNITY): Payer: Medicare Other

## 2016-11-02 ENCOUNTER — Ambulatory Visit: Payer: Medicare Other | Admitting: Family

## 2016-11-02 ENCOUNTER — Other Ambulatory Visit (HOSPITAL_COMMUNITY): Payer: Medicare Other

## 2016-11-21 ENCOUNTER — Ambulatory Visit: Payer: Medicare Other | Admitting: Adult Health

## 2017-12-13 IMAGING — CT CT CHEST W/O CM
3 of 4 series · 17 of 30 positions shown, 19 images · non-contrast
Comparison: 01/22/2015, 08/30/2010

CLINICAL DATA: Bronchiectasis without complication for several
days, no antibiotics, ex smoker quit [id number], hx of CABG, no hx of
cancer

EXAM:
CT CHEST WITHOUT CONTRAST
TECHNIQUE: Multidetector CT imaging of the chest was performed following the
standard protocol without IV contrast.

[Series 3: chest w/o · axial · non-contrast · 0.70mm/px · z∈[-276,-46]mm · 5 of 70 slices shown, 7 images]
[im 12/70  mediastinal]
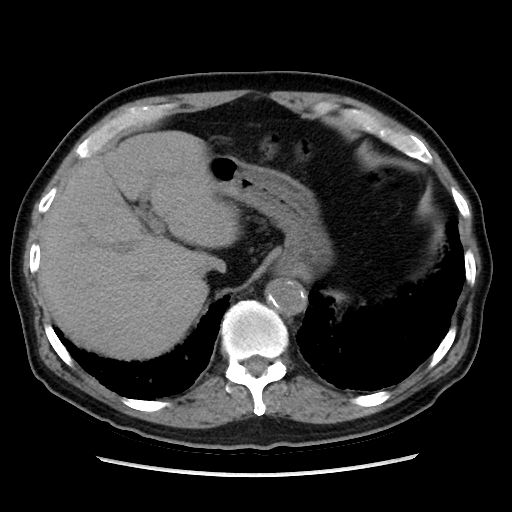
[im 12/70  lung]
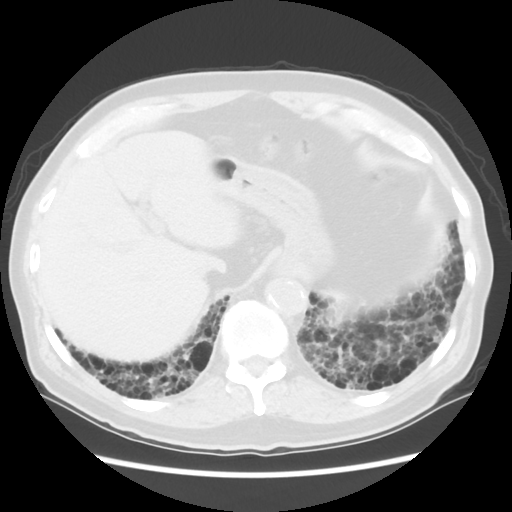
[im 24/70  lung]
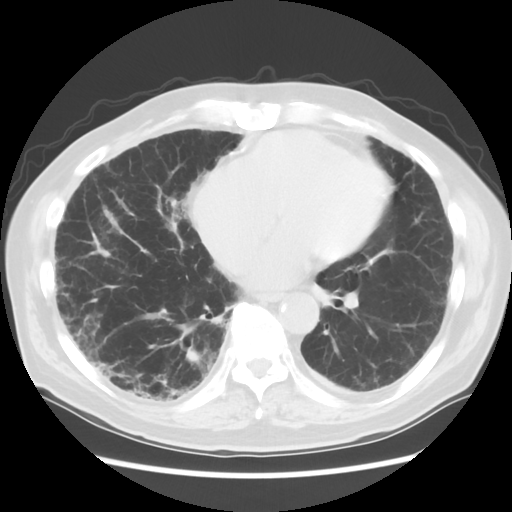
[im 35/70  lung]
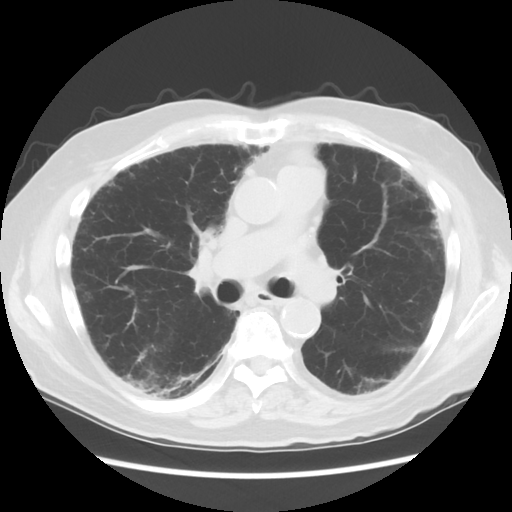
[im 47/70  lung]
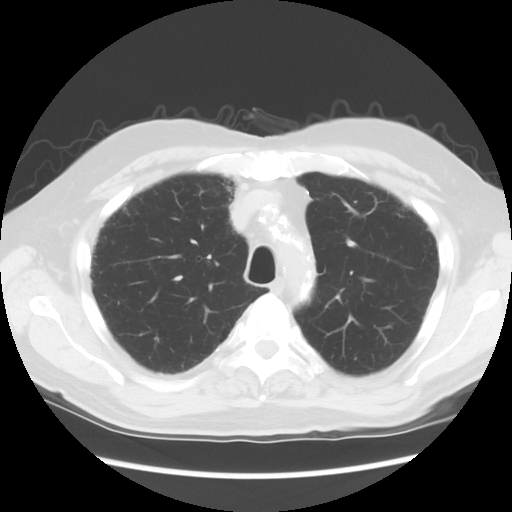
[im 58/70  mediastinal]
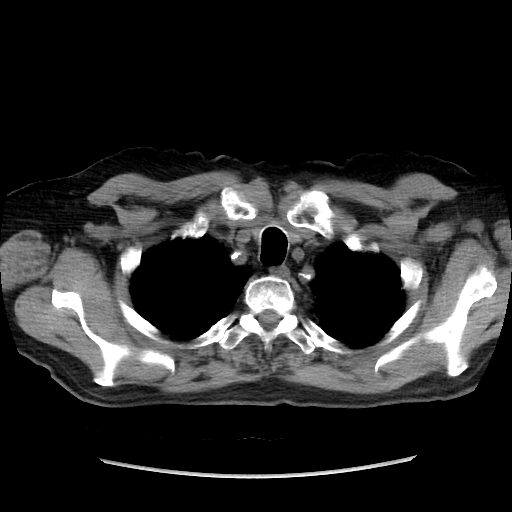
[im 58/70  lung]
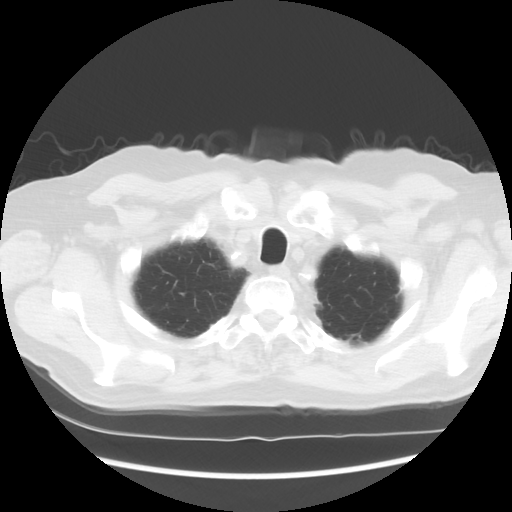

[Series 4: lung windows · axial · 0.70mm/px · z∈[-276,-46]mm · 5 of 70 slices shown]
[im 12/70  lung]
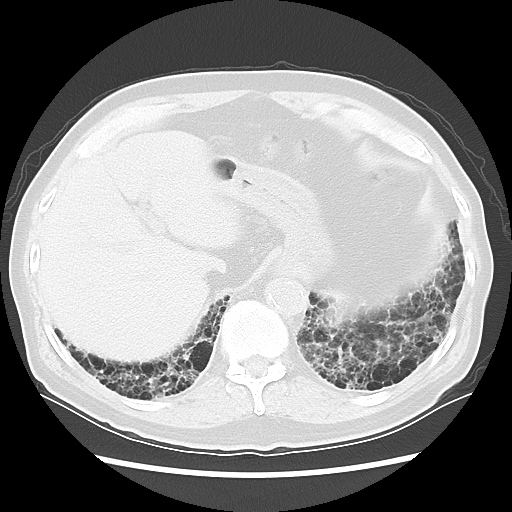
[im 24/70  lung]
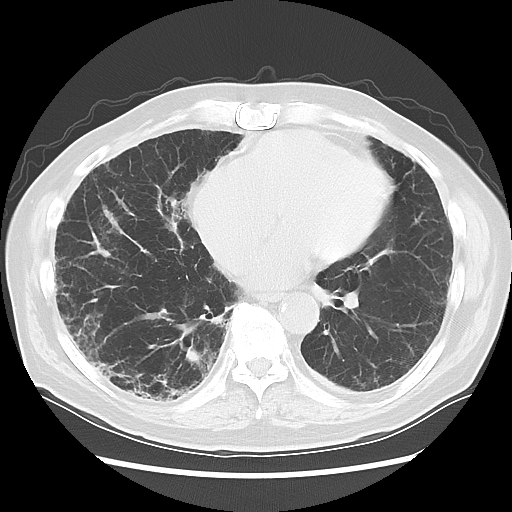
[im 35/70  lung]
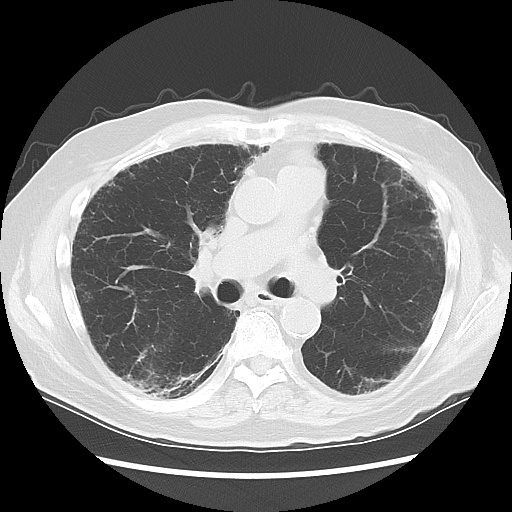
[im 47/70  lung]
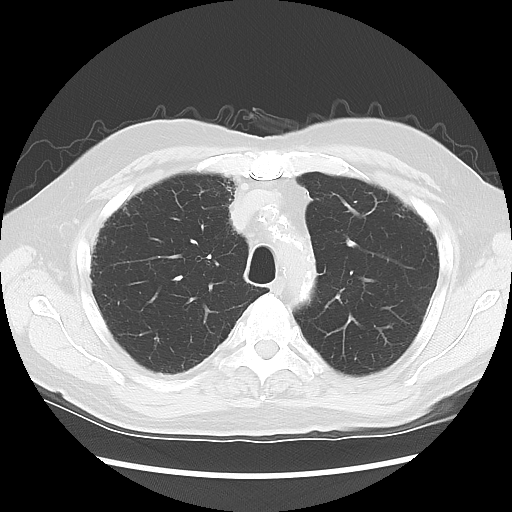
[im 58/70  lung]
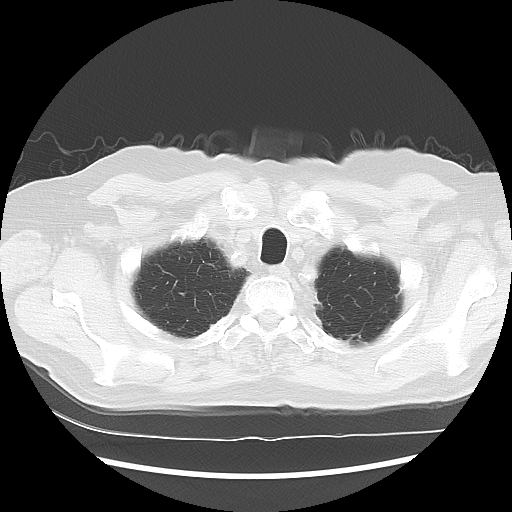

[Series 602: sagittal body · sagittal · 0.70mm/px · 7 of 145 slices shown]
[im 12/145  mediastinal]
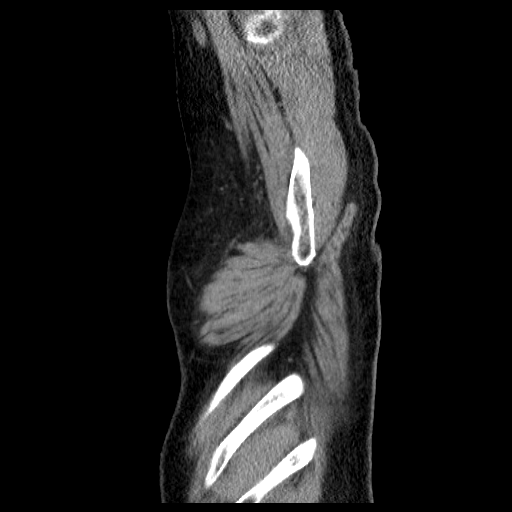
[im 34/145  mediastinal]
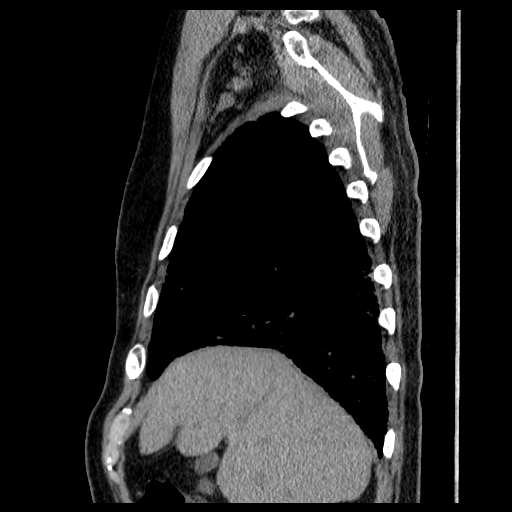
[im 45/145  mediastinal]
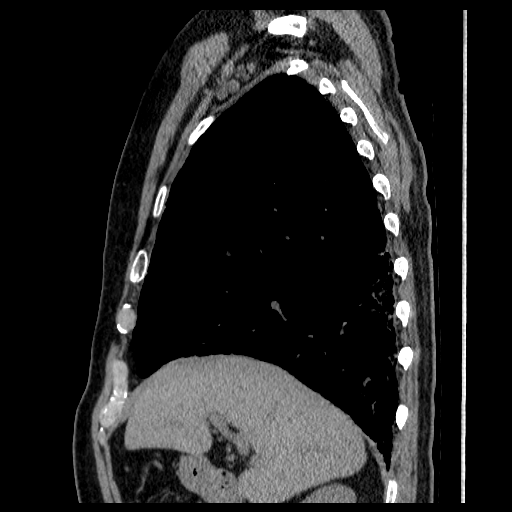
[im 67/145  mediastinal]
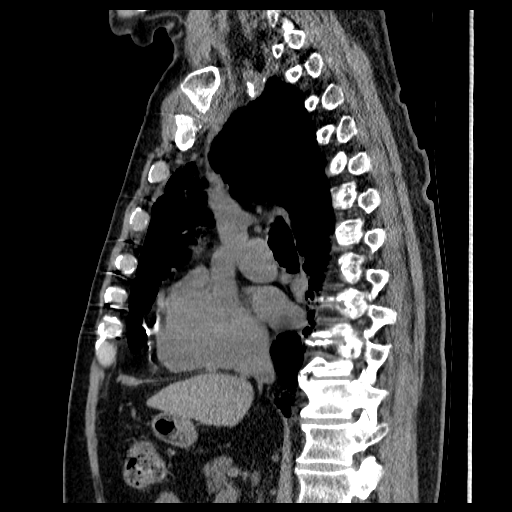
[im 78/145  mediastinal]
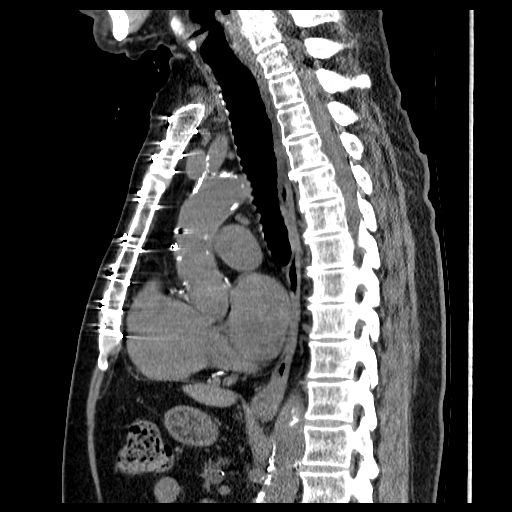
[im 100/145  mediastinal]
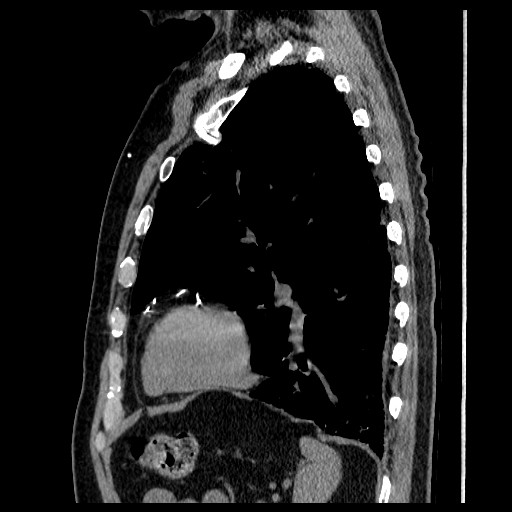
[im 111/145  mediastinal]
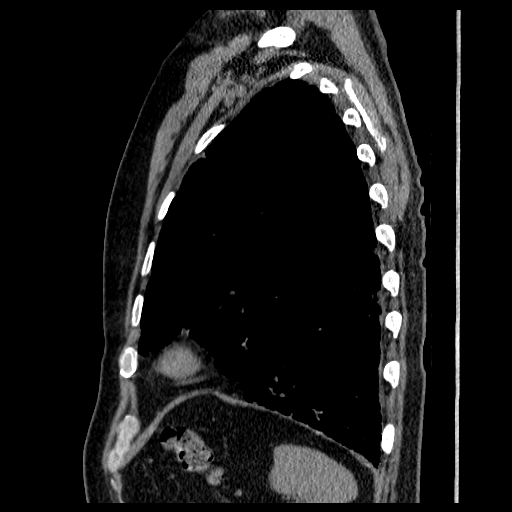

[17 of 30 positions shown; findings below may reference images not displayed]

FINDINGS: Mediastinum/Nodes: Thyroid and thoracic inlet normal. Small
nonpathologic mediastinal lymph nodes. Extensive coronary artery
calcification. Moderate aortic calcification. No pericardial
effusion.

Lungs/Pleura: Chronic heavy fibrotic change at the lung bases
similar to prior study. Milder subpleural interstitial change in the
mid to upper lung zones. 3 mm right apical pulmonary nodule image
17. No pleural effusion. 3 mm calcified nodule left lung base.

Upper abdomen: No acute findings

Musculoskeletal: Status post median sternotomy.  No acute findings.
IMPRESSION: Significant chronic interstitial lung disease.

3 mm nonspecific right apical pulmonary nodule. If the patient is at
high risk for bronchogenic carcinoma, follow-up chest CT at 1 year
is recommended. If the patient is at low risk, no follow-up is
needed. This recommendation follows the consensus statement:
Guidelines for Management of Small Pulmonary Nodules Detected on CT
Scans: A Statement from the [HOSPITAL] as published in
